# Patient Record
Sex: Female | Born: 1990 | Race: White | Hispanic: No | Marital: Single | Smoking: Current every day smoker
Health system: Southern US, Academic
[De-identification: ages and names within clinical notes are randomized; demographics above are authoritative.]

## PROBLEM LIST (undated history)

## (undated) DIAGNOSIS — I4581 Long QT syndrome: Secondary | ICD-10-CM

## (undated) DIAGNOSIS — D649 Anemia, unspecified: Secondary | ICD-10-CM

## (undated) DIAGNOSIS — Q270 Congenital absence and hypoplasia of umbilical artery: Secondary | ICD-10-CM

## (undated) DIAGNOSIS — F99 Mental disorder, not otherwise specified: Secondary | ICD-10-CM

## (undated) DIAGNOSIS — K635 Polyp of colon: Secondary | ICD-10-CM

## (undated) DIAGNOSIS — F845 Asperger's syndrome: Secondary | ICD-10-CM

## (undated) DIAGNOSIS — K219 Gastro-esophageal reflux disease without esophagitis: Secondary | ICD-10-CM

## (undated) DIAGNOSIS — E785 Hyperlipidemia, unspecified: Secondary | ICD-10-CM

## (undated) DIAGNOSIS — K029 Dental caries, unspecified: Secondary | ICD-10-CM

## (undated) DIAGNOSIS — E119 Type 2 diabetes mellitus without complications: Secondary | ICD-10-CM

## (undated) DIAGNOSIS — Z8719 Personal history of other diseases of the digestive system: Secondary | ICD-10-CM

## (undated) DIAGNOSIS — R519 Headache, unspecified: Secondary | ICD-10-CM

## (undated) DIAGNOSIS — R569 Unspecified convulsions: Secondary | ICD-10-CM

## (undated) DIAGNOSIS — J45909 Unspecified asthma, uncomplicated: Secondary | ICD-10-CM

## (undated) DIAGNOSIS — G8929 Other chronic pain: Secondary | ICD-10-CM

## (undated) DIAGNOSIS — F419 Anxiety disorder, unspecified: Secondary | ICD-10-CM

## (undated) DIAGNOSIS — G40909 Epilepsy, unspecified, not intractable, without status epilepticus: Secondary | ICD-10-CM

## (undated) DIAGNOSIS — E282 Polycystic ovarian syndrome: Secondary | ICD-10-CM

## (undated) DIAGNOSIS — I10 Essential (primary) hypertension: Secondary | ICD-10-CM

## (undated) DIAGNOSIS — Z8669 Personal history of other diseases of the nervous system and sense organs: Secondary | ICD-10-CM

## (undated) DIAGNOSIS — F32A Depression, unspecified: Secondary | ICD-10-CM

## (undated) DIAGNOSIS — T7840XA Allergy, unspecified, initial encounter: Secondary | ICD-10-CM

## (undated) DIAGNOSIS — I214 Non-ST elevation (NSTEMI) myocardial infarction: Secondary | ICD-10-CM

## (undated) DIAGNOSIS — F411 Generalized anxiety disorder: Secondary | ICD-10-CM

## (undated) HISTORY — PX: ADENOIDECTOMY AND MYRINGOTOMY WITH TUBE PLACEMENT: SHX5714

## (undated) HISTORY — PX: TONSILLECTOMY: SUR1361

## (undated) HISTORY — DX: Depression, unspecified: F32.A

## (undated) HISTORY — DX: Dental caries, unspecified: K02.9

## (undated) HISTORY — DX: Polyp of colon: K63.5

## (undated) HISTORY — DX: Other chronic pain: G89.29

## (undated) HISTORY — PX: OTHER SURGICAL HISTORY: SHX169

## (undated) HISTORY — DX: Polycystic ovarian syndrome: E28.2

## (undated) HISTORY — DX: Hyperlipidemia, unspecified: E78.5

## (undated) HISTORY — DX: Unspecified asthma, uncomplicated: J45.909

## (undated) HISTORY — DX: Allergy, unspecified, initial encounter: T78.40XA

## (undated) HISTORY — DX: Epilepsy, unspecified, not intractable, without status epilepticus: G40.909

## (undated) HISTORY — DX: Gastro-esophageal reflux disease without esophagitis: K21.9

## (undated) HISTORY — DX: Personal history of other diseases of the nervous system and sense organs: Z86.69

## (undated) HISTORY — PX: COLONOSCOPY: SHX174

## (undated) HISTORY — DX: Anemia, unspecified: D64.9

## (undated) HISTORY — DX: Congenital absence and hypoplasia of umbilical artery: Q27.0

## (undated) HISTORY — DX: Personal history of other diseases of the digestive system: Z87.19

## (undated) HISTORY — DX: Non-ST elevation (NSTEMI) myocardial infarction: I21.4

## (undated) HISTORY — DX: Type 2 diabetes mellitus without complications: E11.9

## (undated) HISTORY — DX: Generalized anxiety disorder: F41.1

## (undated) HISTORY — DX: Essential (primary) hypertension: I10

## (undated) HISTORY — PX: HX EAR TUBES: 2100001170

## (undated) HISTORY — PX: ESOPHAGOGASTRODUODENOSCOPY: SHX1529

---
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
## 2016-10-03 DIAGNOSIS — R809 Proteinuria, unspecified: Secondary | ICD-10-CM | POA: Insufficient documentation

## 2016-10-03 DIAGNOSIS — E1029 Type 1 diabetes mellitus with other diabetic kidney complication: Secondary | ICD-10-CM | POA: Insufficient documentation

## 2018-05-05 DIAGNOSIS — G8929 Other chronic pain: Secondary | ICD-10-CM | POA: Insufficient documentation

## 2018-05-05 DIAGNOSIS — E042 Nontoxic multinodular goiter: Secondary | ICD-10-CM | POA: Insufficient documentation

## 2018-05-05 DIAGNOSIS — K589 Irritable bowel syndrome without diarrhea: Secondary | ICD-10-CM | POA: Insufficient documentation

## 2018-05-05 DIAGNOSIS — R519 Headache, unspecified: Secondary | ICD-10-CM | POA: Insufficient documentation

## 2018-05-05 DIAGNOSIS — F819 Developmental disorder of scholastic skills, unspecified: Secondary | ICD-10-CM | POA: Insufficient documentation

## 2018-05-05 DIAGNOSIS — F445 Conversion disorder with seizures or convulsions: Secondary | ICD-10-CM | POA: Insufficient documentation

## 2018-05-05 DIAGNOSIS — E079 Disorder of thyroid, unspecified: Secondary | ICD-10-CM | POA: Insufficient documentation

## 2018-05-05 DIAGNOSIS — F419 Anxiety disorder, unspecified: Secondary | ICD-10-CM | POA: Insufficient documentation

## 2018-05-05 DIAGNOSIS — R918 Other nonspecific abnormal finding of lung field: Secondary | ICD-10-CM | POA: Insufficient documentation

## 2018-05-05 DIAGNOSIS — Z8719 Personal history of other diseases of the digestive system: Secondary | ICD-10-CM | POA: Insufficient documentation

## 2018-05-05 DIAGNOSIS — G4700 Insomnia, unspecified: Secondary | ICD-10-CM | POA: Insufficient documentation

## 2018-05-05 DIAGNOSIS — L680 Hirsutism: Secondary | ICD-10-CM | POA: Insufficient documentation

## 2019-06-30 DIAGNOSIS — Z862 Personal history of diseases of the blood and blood-forming organs and certain disorders involving the immune mechanism: Secondary | ICD-10-CM | POA: Insufficient documentation

## 2019-07-20 LAB — OB RESULTS CONSOLE HIV ANTIBODY (ROUTINE TESTING)
HIV: NONREACTIVE
HIV: NONREACTIVE

## 2019-07-20 LAB — OB RESULTS CONSOLE HGB/HCT, BLOOD
HCT: 39 (ref 29–41)
HCT: 39 (ref 29–41)
Hemoglobin: 13.4
Hemoglobin: 13.4

## 2019-07-20 LAB — OB RESULTS CONSOLE ABO/RH: "RH Type ": POSITIVE

## 2019-07-20 LAB — OB RESULTS CONSOLE PLATELET COUNT: Platelets: 266

## 2019-07-20 LAB — OB RESULTS CONSOLE RUBELLA ANTIBODY, IGM: Rubella: IMMUNE

## 2019-07-20 LAB — OB RESULTS CONSOLE TSH: TSH: 2.37

## 2019-07-20 LAB — OB RESULTS CONSOLE RPR: RPR: NONREACTIVE

## 2019-07-20 LAB — OB RESULTS CONSOLE ANTIBODY SCREEN: Antibody Screen: NEGATIVE

## 2019-07-20 LAB — OB RESULTS CONSOLE GC/CHLAMYDIA
Chlamydia: NEGATIVE
Gonorrhea: NEGATIVE

## 2019-07-20 LAB — SICKLE CELL SCREEN: Sickle Cell Screen: NORMAL

## 2019-07-20 LAB — OB RESULTS CONSOLE HEPATITIS B SURFACE ANTIGEN: Hepatitis B Surface Ag: NEGATIVE

## 2019-07-20 LAB — CYSTIC FIBROSIS DIAGNOSTIC STUDY: Interpretation-CFDNA:: NEGATIVE

## 2019-12-09 ENCOUNTER — Other Ambulatory Visit: Payer: Self-pay

## 2019-12-09 ENCOUNTER — Encounter: Payer: Self-pay | Admitting: Obstetrics and Gynecology

## 2019-12-09 ENCOUNTER — Observation Stay
Admission: EM | Admit: 2019-12-09 | Discharge: 2019-12-09 | Disposition: A | Discharge status: Home / Self Care | Payer: Medicaid Other | Attending: Obstetrics and Gynecology | Admitting: Obstetrics and Gynecology

## 2019-12-09 DIAGNOSIS — Z3A34 34 weeks gestation of pregnancy: Secondary | ICD-10-CM

## 2019-12-09 DIAGNOSIS — O24414 Gestational diabetes mellitus in pregnancy, insulin controlled: Secondary | ICD-10-CM | POA: Diagnosis not present

## 2019-12-09 DIAGNOSIS — O0933 Supervision of pregnancy with insufficient antenatal care, third trimester: Secondary | ICD-10-CM | POA: Diagnosis not present

## 2019-12-09 DIAGNOSIS — R109 Unspecified abdominal pain: Secondary | ICD-10-CM | POA: Diagnosis not present

## 2019-12-09 DIAGNOSIS — O163 Unspecified maternal hypertension, third trimester: Secondary | ICD-10-CM | POA: Diagnosis not present

## 2019-12-09 DIAGNOSIS — O26893 Other specified pregnancy related conditions, third trimester: Secondary | ICD-10-CM | POA: Diagnosis present

## 2019-12-09 DIAGNOSIS — O09293 Supervision of pregnancy with other poor reproductive or obstetric history, third trimester: Secondary | ICD-10-CM

## 2019-12-09 HISTORY — DX: Type 2 diabetes mellitus without complications: E11.9

## 2019-12-09 HISTORY — DX: Mental disorder, not otherwise specified: F99

## 2019-12-09 HISTORY — DX: Anxiety disorder, unspecified: F41.9

## 2019-12-09 HISTORY — DX: Headache, unspecified: R51.9

## 2019-12-09 HISTORY — DX: Unspecified convulsions: R56.9

## 2019-12-09 LAB — URINE DRUG SCREEN, QUALITATIVE (ARMC ONLY)
Amphetamines, Ur Screen: NOT DETECTED
Barbiturates, Ur Screen: NOT DETECTED
Benzodiazepine, Ur Scrn: NOT DETECTED
Cannabinoid 50 Ng, Ur ~~LOC~~: NOT DETECTED
Cocaine Metabolite,Ur ~~LOC~~: NOT DETECTED
MDMA (Ecstasy)Ur Screen: NOT DETECTED
Methadone Scn, Ur: NOT DETECTED
Opiate, Ur Screen: NOT DETECTED
Phencyclidine (PCP) Ur S: NOT DETECTED
Tricyclic, Ur Screen: NOT DETECTED

## 2019-12-09 LAB — GLUCOSE, CAPILLARY: Glucose-Capillary: 197 mg/dL — ABNORMAL HIGH (ref 70–99)

## 2019-12-09 MED ORDER — LACTATED RINGERS IV BOLUS
1000.0000 mL | Freq: Once | INTRAVENOUS | Status: AC
  Administered 2019-12-09: 20:00:00 1000 mL via INTRAVENOUS

## 2019-12-09 NOTE — Discharge Instructions (Signed)
Type 1 or Type 2 Diabetes Mellitus During Pregnancy, Diagnosis Type 1 diabetes (type 1 diabetes mellitus) and type 2 diabetes (type 2 diabetes mellitus) are long-term (chronic) diseases. Your diabetes may be caused by one or both of these problems:  Your pancreas does not make enough of a hormone called insulin.  Your pancreas does not respond in a normal way to insulin that it makes. Insulin lets sugars (glucose) go into cells in the body. This gives you energy. If you have diabetes, sugars cannot get into cells. This causes high blood sugar (hyperglycemia). If diabetes is treated, it may not hurt you or your baby. Your doctor will set treatment goals for you. In general, you should have these blood sugar levels:  After not eating for a long time (fasting): 95 mg/dL (5.3 mmol/L).  After meals (postprandial): ? One hour after a meal: at or below 140 mg/dL (7.8 mmol/L). ? Two hours after a meal: at or below 120 mg/dL (6.7 mmol/L).  A1c (hemoglobin A1c) level: 6-6.5%. Follow these instructions at home: Questions to ask your doctor  You may want to ask these questions: ? Do I need to meet with a diabetes educator? ? Where can I find a support group for people with diabetes? ? What equipment will I need to care for myself at home? ? What medicines do I need? When should I take them? ? How often do I need to check my blood sugar? ? What number can I call if I have questions? ? When is my next doctor's visit? General instructions  Take over-the-counter and prescription medicines only as told by your doctor.  Stay at a healthy weight during pregnancy.  Keep all follow-up visits as told by your doctor. This is important. Contact a doctor if:  Your blood sugar is at or above 240 mg/dL (16.1 mmol/L).  Your blood sugar is at or above 200 mg/dL (09.6 mmol/L), and you have ketones in your pee (urine).  You have been sick or have had a fever for 2 days or more and you are not getting  better.  You have any of these problems for more than 6 hours: ? You cannot eat or drink. ? You feel sick to your stomach (nauseous). ? You throw up (vomit). ? You have watery poop (diarrhea). Get help right away if:  Your blood sugar is lower than 54 mg/dL (3 mmol/L).  You get confused.  You have trouble: ? Thinking clearly. ? Breathing.  Your baby moves less than normal.  You have: ? Moderate or large ketone levels in your pee. ? Vaginal bleeding. ? Unusual fluid coming from your vagina. ? Early contractions. These may feel like tightness in your belly. Summary  Type 1 diabetes (type 1 diabetes mellitus) and type 2 diabetes (type 2 diabetes mellitus) are long-term (chronic) diseases.  If diabetes is treated, it may not hurt you or your baby.  Your doctor will set treatment goals for you. This information is not intended to replace advice given to you by your health care provider. Make sure you discuss any questions you have with your health care provider. Document Revised: 03/03/2019 Document Reviewed: 12/16/2015 Elsevier Patient Education  2020 Elsevier Inc. Abdominal Pain During Pregnancy  Belly (abdominal) pain is common during pregnancy. There are many possible causes. Most of the time, it is not a serious problem. Other times, it can be a sign that something is wrong with the pregnancy. Always tell your doctor if you have belly pain.  Follow these instructions at home:  Do not have sex or put anything in your vagina until your pain goes away completely.  Get plenty of rest until your pain gets better.  Drink enough fluid to keep your pee (urine) pale yellow.  Take over-the-counter and prescription medicines only as told by your doctor.  Keep all follow-up visits as told by your doctor. This is important. Contact a doctor if:  Your pain continues or gets worse after resting.  You have lower belly pain that: ? Comes and goes at regular times. ? Spreads to  your back. ? Feels like menstrual cramps.  You have pain or burning when you pee (urinate). Get help right away if:  You have a fever or chills.  You have vaginal bleeding.  You are leaking fluid from your vagina.  You are passing tissue from your vagina.  You throw up (vomit) for more than 24 hours.  You have watery poop (diarrhea) for more than 24 hours.  Your baby is moving less than usual.  You feel very weak or faint.  You have shortness of breath.  You have very bad pain in your upper belly. Summary  Belly (abdominal) pain is common during pregnancy. There are many possible causes.  If you have belly pain during pregnancy, tell your doctor right away.  Keep all follow-up visits as told by your doctor. This is important. This information is not intended to replace advice given to you by your health care provider. Make sure you discuss any questions you have with your health care provider. Document Revised: 03/02/2019 Document Reviewed: 02/14/2017 Elsevier Patient Education  [place].

## 2019-12-09 NOTE — Progress Notes (Signed)
Evans MD notified of patient arrival to unit and inital vital signs including CBG 197. Provider gave verbal orders and will be updated as to lab/diagnostic results as soon as possible.

## 2019-12-09 NOTE — Progress Notes (Addendum)
Evans MD notified of BP's, cervical exam, and contraction frequency. Pt d/c home to self care and educated about establishing care this week to obtain [facility]. Pt educated on signs and symptoms to come back to be assessed. Pt verbalized understanding.

## 2019-12-09 NOTE — OB Triage Note (Addendum)
Pt is a 28yo G2P0 at an estimated [redacted]w[redacted]d that presents from ED with c/o abdominal pain and pelvic pain.  Pt states that "my original OB said something different but I know my last menstrual period was 5/22."   Pt states "this was a twin pregnancy but I lost baby B at 9 weeks." Pt states " I have lost a lot of weight recently. I use the same scale." Pt states on 1/3 she weighed 211 lb and today weighed 209 lb. Pt states she has had 4 total OB visits this pregnancy and one Hospital visit in [place] for elevated BP. Pt states she has chronic hypertension and has not had had her medication in 4 days. Pt is an insulin dependent diabetic and CBG was 197. Pt denies VB and states some LOF since December but " the hospital I went to ran 2 different test and said they were negative for my water being broken." Pt states she can't get records from the hospital. Pt states positive FM. Initial BP 149/93 and cycling q15.

## 2019-12-10 ENCOUNTER — Encounter: Payer: Self-pay | Admitting: Obstetrics and Gynecology

## 2019-12-10 ENCOUNTER — Other Ambulatory Visit: Payer: Self-pay | Admitting: Obstetrics and Gynecology

## 2019-12-10 ENCOUNTER — Ambulatory Visit (INDEPENDENT_AMBULATORY_CARE_PROVIDER_SITE_OTHER): Payer: Medicaid Other | Admitting: Obstetrics and Gynecology

## 2019-12-10 VITALS — BP 150/80 | Wt 209.0 lb

## 2019-12-10 DIAGNOSIS — O99343 Other mental disorders complicating pregnancy, third trimester: Secondary | ICD-10-CM

## 2019-12-10 DIAGNOSIS — O219 Vomiting of pregnancy, unspecified: Secondary | ICD-10-CM | POA: Diagnosis not present

## 2019-12-10 DIAGNOSIS — F600 Paranoid personality disorder: Secondary | ICD-10-CM

## 2019-12-10 DIAGNOSIS — Z113 Encounter for screening for infections with a predominantly sexual mode of transmission: Secondary | ICD-10-CM

## 2019-12-10 DIAGNOSIS — O24113 Pre-existing diabetes mellitus, type 2, in pregnancy, third trimester: Secondary | ICD-10-CM

## 2019-12-10 DIAGNOSIS — E785 Hyperlipidemia, unspecified: Secondary | ICD-10-CM

## 2019-12-10 DIAGNOSIS — Z3A33 33 weeks gestation of pregnancy: Secondary | ICD-10-CM

## 2019-12-10 DIAGNOSIS — G40909 Epilepsy, unspecified, not intractable, without status epilepticus: Secondary | ICD-10-CM

## 2019-12-10 DIAGNOSIS — O99353 Diseases of the nervous system complicating pregnancy, third trimester: Secondary | ICD-10-CM

## 2019-12-10 DIAGNOSIS — O10013 Pre-existing essential hypertension complicating pregnancy, third trimester: Secondary | ICD-10-CM

## 2019-12-10 DIAGNOSIS — F845 Asperger's syndrome: Secondary | ICD-10-CM

## 2019-12-10 DIAGNOSIS — F419 Anxiety disorder, unspecified: Secondary | ICD-10-CM

## 2019-12-10 DIAGNOSIS — O24013 Pre-existing diabetes mellitus, type 1, in pregnancy, third trimester: Secondary | ICD-10-CM | POA: Insufficient documentation

## 2019-12-10 DIAGNOSIS — O0990 Supervision of high risk pregnancy, unspecified, unspecified trimester: Secondary | ICD-10-CM

## 2019-12-10 DIAGNOSIS — Z124 Encounter for screening for malignant neoplasm of cervix: Secondary | ICD-10-CM

## 2019-12-10 DIAGNOSIS — O0993 Supervision of high risk pregnancy, unspecified, third trimester: Secondary | ICD-10-CM

## 2019-12-10 DIAGNOSIS — O99350 Diseases of the nervous system complicating pregnancy, unspecified trimester: Secondary | ICD-10-CM

## 2019-12-10 MED ORDER — ACCU-CHEK AVIVA PLUS VI STRP: ORAL_STRIP | 12 refills | Status: DC

## 2019-12-10 MED ORDER — TRIMETHOBENZAMIDE HCL 300 MG PO CAPS: 300.0000 mg | ORAL_CAPSULE | Freq: Three times a day (TID) | ORAL | 11 refills | Status: DC

## 2019-12-10 MED ORDER — CLONIDINE HCL 0.3 MG PO TABS: 0.3000 mg | ORAL_TABLET | Freq: Every day | ORAL | 11 refills | Status: AC

## 2019-12-10 MED ORDER — PEN NEEDLES 31G X 6 MM MISC: 1.0000 | Freq: Four times a day (QID) | 11 refills | Status: DC

## 2019-12-10 MED ORDER — CLONAZEPAM 1 MG PO TABS: 1.0000 mg | ORAL_TABLET | Freq: Two times a day (BID) | ORAL | 5 refills | Status: DC

## 2019-12-10 NOTE — Progress Notes (Signed)
NOB C/o contractions, nausea with contractions, elevated BP Poss lof, no vb, FM less active last 2 days

## 2019-12-10 NOTE — Progress Notes (Signed)
12/14/2019   Chief Complaint: Missed period  Transfer of Care Patient: yes  History of Present Illness: Joy [name] is a 29 y.o. G2P0010 [redacted]w[redacted]d based on Patient's last menstrual period was 04/17/2019. with an Estimated Date of Delivery: 02/21/20, with the above CC.   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  She has moved several times and stayed with family and friends although she tends to be kicked out of various housing arrangements.  She was seen recently at the hospital by Dr. Logan Bores. She complains of pain last night and leakage of fluid but doe not feel like her concerns were adequately addressed. She is concerned about the possibility of preterm labor and PROM.    Her periods were: regular periods every 28 days She was using no method when she conceived.  She has Positive signs or symptoms of nausea/vomiting of pregnancy. She has Positive signs or symptoms of miscarriage or preterm labor She was not taking different medications around the time she conceived/early pregnancy. Since her LMP, she has not used alcohol Since her LMP, she has used tobacco products. She smokes 2-4 cigarettes a day. She has smoked for 10 years. Since her LMP, she has not used illegal drugs.    Current or past history of domestic violence. no  Infection History:  1. Since her LMP, she has not had a viral illness.  2. She admits to close contact with children on a regular basis     3. She denies a history of chicken pox. She reports vaccination for chicken pox in the past. 4. Patient or partner has history of genital herpes  no 5. History of STI (GC, CT, HPV, syphilis, HIV)  yes  history of cchlamydia 6.  She does not live with someone with TB or TB exposed. 7. History of recent travel :  no 8. She identifies Negative Zika risk factors for her and her partner 7. There are not cats in the home in the home.  She understands that while pregnant she should not change cat litter.   Genetic Screening Questions: (Includes patient, baby's father, or anyone in either family)   1. Patient's age >/= 26 at [facility]  no 2. Thalassemia ([place], [place], Mediterranean, or Asian background): MCV<80  no 3. Neural tube defect (meningomyelocele, spina bifida, anencephaly)  no 4. Congenital heart defect  yes  FOB has a "hole in his heart" 5. Down syndrome  Yes Third cousin 50. Tay-Sachs (Jewish, [place]  no 7. Canavan's Disease  no 8. Sickle cell disease or trait (African)  no  9. Hemophilia or other blood disorders  no  10. Muscular dystrophy  no  11. Cystic fibrosis  no  12. Huntington's Chorea  no  13. Mental retardation/autism  Yes- she has a history of asperger 14. Other inherited genetic or chromosomal disorder  no 15. Maternal metabolic disorder (DM, PKU, etc)  yes 16. Patient or FOB with a child with a birth defect not listed above no  16a. Patient or FOB with a birth defect themselves yes 17. Recurrent pregnancy loss, or stillbirth  no  18. Any medications since LMP other than prenatal vitamins (include vitamins, supplements, OTC meds, drugs, alcohol)  no 19. Any other genetic/environmental exposure to discuss  no  ROS:  Review of Systems  Constitutional: Negative for chills, fever, malaise/fatigue and weight loss.  HENT: Negative for congestion, hearing loss and sinus pain.   Eyes: Negative for blurred vision and double vision.  Respiratory: Negative for cough, sputum production, shortness of breath and wheezing.   Cardiovascular: Negative for chest pain, palpitations, orthopnea and leg swelling.  Gastrointestinal: Negative for abdominal pain, constipation,  diarrhea, nausea and vomiting.  Genitourinary: Negative for dysuria, flank pain, frequency, hematuria and urgency.  Musculoskeletal: Negative for back pain, falls and joint pain.  Skin: Negative for itching and rash.  Neurological: Negative for dizziness and headaches.  Psychiatric/Behavioral: Negative for depression, substance abuse and suicidal ideas. The patient is not nervous/anxious.     OBGYN History: As per HPI. OB History  Gravida Para Term Preterm AB Living  2       1    SAB TAB Ectopic Multiple Live Births  1            # Outcome Date GA Lbr Len/2nd Weight Sex Delivery Anes PTL Lv  2 Current           1 SAB 09/28/10            Any issues with any prior pregnancies: no Any prior children are healthy, doing well, without any problems or issues: not applicable Last pap smear   History of STIs: Yes   Past Medical History: Past Medical History:  Diagnosis Date  . Anxiety   . Diabetes mellitus without complication (HCC)   . Headache   . Mental disorder    Depression. past suicide attempt  . Seizures (HCC)     Past Surgical History: Past Surgical History:  Procedure Laterality Date  . TONSILLECTOMY      Family History:  History reviewed. No pertinent family history. She denies any female cancers, bleeding or blood clotting disorders.   Social History:  Social History   Socioeconomic History  . Marital status: Significant Other    Spouse name: michael  . Number of children: Not on file  . Years of education: Not on file  . Highest education level: Not on file  Occupational History  . Not on file  Tobacco Use  . Smoking status: Current Every Day Smoker  . Smokeless tobacco: Current User  Substance and Sexual Activity  . Alcohol use: Never  . Drug use: Not Currently  . Sexual activity: Yes    Birth control/protection: Condom  Other Topics Concern  . Not on file  Social History Narrative  . Not on file   Social Determinants of Health   Financial  Resource Strain:   . Difficulty of Paying Living Expenses: Not on file  Food Insecurity:   . Worried About Programme researcher, broadcasting/film/video in the Last Year: Not on file  . Ran Out of Food in the Last Year: Not on file  Transportation Needs:   . Lack of Transportation (Medical): Not on file  . Lack of Transportation (Non-Medical): Not on file  Physical Activity:   . Days of Exercise per Week: Not on file  . Minutes of Exercise per Session: Not on file  Stress:   . Feeling of Stress : Not on file  Social Connections:   . Frequency of Communication with Friends and Family: Not on file  . Frequency of Social Gatherings with Friends and Family: Not on file  . Attends Religious Services: Not on file  . Active Member of Clubs or Organizations: Not  on file  . Attends Archivist Meetings: Not on file  . Marital Status: Not on file  Intimate Partner Violence:   . Fear of Current or Ex-Partner: Not on file  . Emotionally Abused: Not on file  . Physically Abused: Not on file  . Sexually Abused: Not on file    Allergy: Allergies  Allergen Reactions  . Liraglutide     Drug induced acute pancreatitis.   Yvette Rack [Cyclobenzaprine]     Mood changes  . Gabapentin Other (See Comments)    Mood changes  . Latex Hives  . Manganese Trace Metal Additives [Manganese] Rash  . Other Rash    Current Outpatient Medications:  Current Outpatient Medications:  .  albuterol (VENTOLIN HFA) 108 (90 Base) MCG/ACT inhaler, Inhale 1-2 puffs into the lungs every 6 (six) hours as needed for wheezing or shortness of breath., Disp: , Rfl:  .  baclofen (LIORESAL) 10 MG tablet, Take 10 mg by mouth 3 (three) times daily., Disp: , Rfl:  .  Cholecalciferol (VITAMIN D3) 2400 UNIT/ML LIQD, by Does not apply route., Disp: , Rfl:  .  clonazePAM (KLONOPIN) 1 MG tablet, Take 1 tablet (1 mg total) by mouth 2 (two) times daily., Disp: 60 tablet, Rfl: 5 .  cloNIDine (CATAPRES) 0.3 MG tablet, Take 1 tablet (0.3 mg total) by  mouth daily., Disp: 30 tablet, Rfl: 11 .  insulin glargine (LANTUS) 100 UNIT/ML injection, Inject 32 Units into the skin daily., Disp: , Rfl:  .  insulin lispro (HUMALOG) 100 UNIT/ML injection, Inject 16 Units into the skin 3 (three) times daily before meals., Disp: , Rfl:  .  metFORMIN (GLUCOPHAGE) 1000 MG tablet, Take 1,000 mg by mouth 2 (two) times daily with a meal., Disp: , Rfl:  .  Prenatal Vit-Fe Fumarate-FA (PRENATAL MULTIVITAMIN) TABS tablet, Take 1 tablet by mouth daily at 12 noon., Disp: , Rfl:  .  trimethobenzamide (TIGAN) 300 MG capsule, Take 1 capsule (300 mg total) by mouth 3 (three) times daily., Disp: 90 capsule, Rfl: 11 .  glucose blood (ACCU-CHEK AVIVA PLUS) test strip, Use as instructed, Disp: 100 each, Rfl: 12 .  Insulin Pen Needle (PEN NEEDLES) 31G X 6 MM MISC, 1 each by Does not apply route 4 (four) times daily., Disp: 100 each, Rfl: 11   Physical Exam: Physical Exam  Constitutional: She is oriented to person, place, and time and well-developed, well-nourished, and in no distress.  HENT:  Head: Normocephalic and atraumatic.  Eyes: Pupils are equal, round, and reactive to light.  Neck: No thyromegaly present.  Cardiovascular: Normal rate and regular rhythm.  Pulmonary/Chest: Effort normal.  Abdominal: Soft. Bowel sounds are normal. She exhibits no distension. There is no abdominal tenderness. There is no rebound and no guarding.  Musculoskeletal:        General: Normal range of motion.     Cervical back: Normal range of motion and neck supple.  Neurological: She is alert and oriented to person, place, and time.  Skin: Skin is warm and dry.  Psychiatric: Affect and judgment normal.  Nursing note and vitals reviewed.    Assessment: Joy [name] is a 29 y.o. G2P0010 [redacted]w[redacted]d based on Patient's last menstrual period was 04/17/2019. with an Estimated Date of Delivery: 01/22/20,  for prenatal care.  Plan:  1) Avoid alcoholic beverages. 2) Patient encouraged not to  smoke.  3) Discontinue the use of all non-medicinal drugs and chemicals.  4) Take prenatal vitamins daily.  5) Seatbelt use advised 6)  Nutrition, food safety (fish, cheese advisories, and high nitrite foods) and exercise discussed. 7) Hospital and practice style delivering at [facility] discussed  8) Patient is asked about travel to areas at risk for the Zika virus, and counseled to avoid travel and exposure to mosquitoes or sexual partners who may have themselves been exposed to the virus. Testing is discussed, and will be ordered as appropriate.  9) Childbirth classes at [facility] advised 10) Genetic Screening, such as with 1st Trimester Screening, cell free fetal DNA, AFP testing, and Ultrasound, as well as with amniocentesis and CVS as appropriate, is discussed with patient. She plans to not have genetic testing this pregnancy. (late presentation)  Insulin dependant diabetes: did not bring glucose log- will need to initiate NSTs/AFI. Discussed importance of bringing glucose log to visits.  Taking Lantus 32 units at night, Humalog 16/16/16 and a sliding scale. She reports her blood sugars have ranged from 90-150 mostly. Growth [place] next visit MFM referral Refills of medications sent.  Elevated BP< declines reporting to L&D for preeclampsia evaluation. Reports elevated BP from not taking BP medication. Denies HA or vision changes.  No evidence or PROM on speculum exam/ slide. Normal amniotic fluid level on bedside US with 3cmx3cm pocket.   Due date changed from what she reported based on 9 wk [place] found in her prenatal records.   Problem list reviewed and updated.  I discussed the assessment and treatment plan with the patient. The patient was provided an opportunity to ask questions and all were answered. The patient agreed with the plan and demonstrated an understanding of the instructions.  Adelene Idler MD [facility], [facility] 12/14/2019 7:06 AM

## 2019-12-10 NOTE — Patient Instructions (Signed)
Third Trimester of Pregnancy The third trimester is from week 28 through week 40 (months 7 through 9). The third trimester is a time when the unborn baby (fetus) is growing rapidly. At the end of the ninth month, the fetus is about 20 inches in length and weighs 6-10 pounds. Body changes during your third trimester Your body will continue to go through many changes during pregnancy. The changes vary from woman to woman. During the third trimester:  Your weight will continue to increase. You can expect to gain 25-35 pounds (11-16 kg) by the end of the pregnancy.  You may begin to get stretch marks on your hips, abdomen, and breasts.  You may urinate more often because the fetus is moving lower into your pelvis and pressing on your bladder.  You may develop or continue to have heartburn. This is caused by increased hormones that slow down muscles in the digestive tract.  You may develop or continue to have constipation because increased hormones slow digestion and cause the muscles that push waste through your intestines to relax.  You may develop hemorrhoids. These are swollen veins (varicose veins) in the rectum that can itch or be painful.  You may develop swollen, bulging veins (varicose veins) in your legs.  You may have increased body aches in the pelvis, back, or thighs. This is due to weight gain and increased hormones that are relaxing your joints.  You may have changes in your hair. These can include thickening of your hair, rapid growth, and changes in texture. Some women also have hair loss during or after pregnancy, or hair that feels dry or thin. Your hair will most likely return to normal after your baby is born.  Your breasts will continue to grow and they will continue to become tender. A yellow fluid (colostrum) may leak from your breasts. This is the first milk you are producing for your baby.  Your belly button may stick out.  You may notice more swelling in your hands,  face, or ankles.  You may have increased tingling or numbness in your hands, arms, and legs. The skin on your belly may also feel numb.  You may feel short of breath because of your expanding uterus.  You may have more problems sleeping. This can be caused by the size of your belly, increased need to urinate, and an increase in your body's metabolism.  You may notice the fetus "dropping," or moving lower in your abdomen (lightening).  You may have increased vaginal discharge.  You may notice your joints feel loose and you may have pain around your pelvic bone. What to expect at prenatal visits You will have prenatal exams every 2 weeks until week 36. Then you will have weekly prenatal exams. During a routine prenatal visit:  You will be weighed to make sure you and the baby are growing normally.  Your blood pressure will be taken.  Your abdomen will be measured to track your baby's growth.  The fetal heartbeat will be listened to.  Any test results from the previous visit will be discussed.  You may have a cervical check near your due date to see if your cervix has softened or thinned (effaced).  You will be tested for Group B streptococcus. This happens between 35 and 37 weeks. Your health care provider may ask you:  What your birth plan is.  How you are feeling.  If you are feeling the baby move.  If you have had any abnormal   symptoms, such as leaking fluid, bleeding, severe headaches, or abdominal cramping.  If you are using any tobacco products, including cigarettes, chewing tobacco, and electronic cigarettes.  If you have any questions. Other tests or screenings that may be performed during your third trimester include:  Blood tests that check for low iron levels (anemia).  Fetal testing to check the health, activity level, and growth of the fetus. Testing is done if you have certain medical conditions or if there are problems during the pregnancy.  Nonstress test  (NST). This test checks the health of your baby to make sure there are no signs of problems, such as the baby not getting enough oxygen. During this test, a belt is placed around your belly. The baby is made to move, and its heart rate is monitored during movement. What is false labor? False labor is a condition in which you feel small, irregular tightenings of the muscles in the womb (contractions) that usually go away with rest, changing position, or drinking water. These are called Braxton Hicks contractions. Contractions may last for hours, days, or even weeks before true labor sets in. If contractions come at regular intervals, become more frequent, increase in intensity, or become painful, you should see your health care provider. What are the signs of labor?  Abdominal cramps.  Regular contractions that start at 10 minutes apart and become stronger and more frequent with time.  Contractions that start on the top of the uterus and spread down to the lower abdomen and back.  Increased pelvic pressure and dull back pain.  A watery or bloody mucus discharge that comes from the vagina.  Leaking of amniotic fluid. This is also known as your "water breaking." It could be a slow trickle or a gush. Let your health care provider know if it has a color or strange odor. If you have any of these signs, call your health care provider right away, even if it is before your due date. Follow these instructions at home: Medicines  Follow your health care provider's instructions regarding medicine use. Specific medicines may be either safe or unsafe to take during pregnancy.  Take a prenatal vitamin that contains at least 600 micrograms (mcg) of folic acid.  If you develop constipation, try taking a stool softener if your health care provider approves. Eating and drinking   Eat a balanced diet that includes fresh fruits and vegetables, whole grains, good sources of protein such as meat, eggs, or tofu,  and low-fat dairy. Your health care provider will help you determine the amount of weight gain that is right for you.  Avoid raw meat and uncooked cheese. These carry germs that can cause birth defects in the baby.  If you have low calcium intake from food, talk to your health care provider about whether you should take a daily calcium supplement.  Eat four or five small meals rather than three large meals a day.  Limit foods that are high in fat and processed sugars, such as fried and sweet foods.  To prevent constipation: ? Drink enough fluid to keep your urine clear or pale yellow. ? Eat foods that are high in fiber, such as fresh fruits and vegetables, whole grains, and beans. Activity  Exercise only as directed by your health care provider. Most women can continue their usual exercise routine during pregnancy. Try to exercise for 30 minutes at least 5 days a week. Stop exercising if you experience uterine contractions.  Avoid heavy lifting.  Do   not exercise in extreme heat or humidity, or at high altitudes.  Wear low-heel, comfortable shoes.  Practice good posture.  You may continue to have sex unless your health care provider tells you otherwise. Relieving pain and discomfort  Take frequent breaks and rest with your legs elevated if you have leg cramps or low back pain.  Take warm sitz baths to soothe any pain or discomfort caused by hemorrhoids. Use hemorrhoid cream if your health care provider approves.  Wear a good support bra to prevent discomfort from breast tenderness.  If you develop varicose veins: ? Wear support pantyhose or compression stockings as told by your healthcare provider. ? Elevate your feet for 15 minutes, 3-4 times a day. Prenatal care  Write down your questions. Take them to your prenatal visits.  Keep all your prenatal visits as told by your health care provider. This is important. Safety  Wear your seat belt at all times when driving.  Make  a list of emergency phone numbers, including numbers for family, friends, the hospital, and police and fire departments. General instructions  Avoid cat litter boxes and soil used by cats. These carry germs that can cause birth defects in the baby. If you have a cat, ask someone to clean the litter box for you.  Do not travel far distances unless it is absolutely necessary and only with the approval of your health care provider.  Do not use hot tubs, steam rooms, or saunas.  Do not drink alcohol.  Do not use any products that contain nicotine or tobacco, such as cigarettes and e-cigarettes. If you need help quitting, ask your health care provider.  Do not use any medicinal herbs or unprescribed drugs. These chemicals affect the formation and growth of the baby.  Do not douche or use tampons or scented sanitary pads.  Do not cross your legs for long periods of time.  To prepare for the arrival of your baby: ? Take prenatal classes to understand, practice, and ask questions about labor and delivery. ? Make a trial run to the hospital. ? Visit the hospital and tour the maternity area. ? Arrange for maternity or paternity leave through employers. ? Arrange for family and friends to take care of pets while you are in the hospital. ? Purchase a rear-facing car seat and make sure you know how to install it in your car. ? Pack your hospital bag. ? Prepare the baby's nursery. Make sure to remove all pillows and stuffed animals from the baby's crib to prevent suffocation.  Visit your dentist if you have not gone during your pregnancy. Use a soft toothbrush to brush your teeth and be gentle when you floss. Contact a health care provider if:  You are unsure if you are in labor or if your water has broken.  You become dizzy.  You have mild pelvic cramps, pelvic pressure, or nagging pain in your abdominal area.  You have lower back pain.  You have persistent nausea, vomiting, or  diarrhea.  You have an unusual or bad smelling vaginal discharge.  You have pain when you urinate. Get help right away if:  Your water breaks before 37 weeks.  You have regular contractions less than 5 minutes apart before 37 weeks.  You have a fever.  You are leaking fluid from your vagina.  You have spotting or bleeding from your vagina.  You have severe abdominal pain or cramping.  You have rapid weight loss or weight gain.  You have   shortness of breath with chest pain.  You notice sudden or extreme swelling of your face, hands, ankles, feet, or legs.  Your baby makes fewer than 10 movements in 2 hours.  You have severe headaches that do not go away when you take medicine.  You have vision changes. Summary  The third trimester is from week 28 through week 40, months 7 through 9. The third trimester is a time when the unborn baby (fetus) is growing rapidly.  During the third trimester, your discomfort may increase as you and your baby continue to gain weight. You may have abdominal, leg, and back pain, sleeping problems, and an increased need to urinate.  During the third trimester your breasts will keep growing and they will continue to become tender. A yellow fluid (colostrum) may leak from your breasts. This is the first milk you are producing for your baby.  False labor is a condition in which you feel small, irregular tightenings of the muscles in the womb (contractions) that eventually go away. These are called Braxton Hicks contractions. Contractions may last for hours, days, or even weeks before true labor sets in.  Signs of labor can include: abdominal cramps; regular contractions that start at 10 minutes apart and become stronger and more frequent with time; watery or bloody mucus discharge that comes from the vagina; increased pelvic pressure and dull back pain; and leaking of amniotic fluid. This information is not intended to replace advice given to you by your  health care provider. Make sure you discuss any questions you have with your health care provider. Document Revised: 03/05/2019 Document Reviewed: 12/18/2016 Elsevier Patient Education  2020 [employer]  

## 2019-12-11 ENCOUNTER — Other Ambulatory Visit (HOSPITAL_COMMUNITY)
Admission: RE | Admit: 2019-12-11 | Discharge: 2019-12-11 | Disposition: A | Discharge status: Home / Self Care | Payer: Medicaid Other | Source: Ambulatory Visit | Attending: Obstetrics and Gynecology | Admitting: Obstetrics and Gynecology

## 2019-12-11 DIAGNOSIS — Z113 Encounter for screening for infections with a predominantly sexual mode of transmission: Secondary | ICD-10-CM | POA: Insufficient documentation

## 2019-12-11 DIAGNOSIS — Z124 Encounter for screening for malignant neoplasm of cervix: Secondary | ICD-10-CM | POA: Insufficient documentation

## 2019-12-11 DIAGNOSIS — O0990 Supervision of high risk pregnancy, unspecified, unspecified trimester: Secondary | ICD-10-CM | POA: Insufficient documentation

## 2019-12-11 LAB — COMPREHENSIVE METABOLIC PANEL
ALT: 13 IU/L (ref 0–32)
AST: 20 IU/L (ref 0–40)
Albumin/Globulin Ratio: 1.3 (ref 1.2–2.2)
Albumin: 3.7 g/dL — ABNORMAL LOW (ref 3.9–5.0)
Alkaline Phosphatase: 124 IU/L — ABNORMAL HIGH (ref 39–117)
BUN/Creatinine Ratio: 8 — ABNORMAL LOW (ref 9–23)
BUN: 4 mg/dL — ABNORMAL LOW (ref 6–20)
Bilirubin Total: <0.2 mg/dL (ref 0.0–1.2)
CO2: 19 mmol/L — ABNORMAL LOW (ref 20–29)
Calcium: 10.1 mg/dL (ref 8.7–10.2)
Chloride: 101 mmol/L (ref 96–106)
Creatinine, Ser: 0.5 mg/dL — ABNORMAL LOW (ref 0.57–1.00)
GFR calc Af Amer: 153 mL/min/{1.73_m2} (ref >59)
GFR calc non Af Amer: 133 mL/min/{1.73_m2} (ref >59)
Globulin, Total: 2.8 g/dL (ref 1.5–4.5)
Glucose: 102 mg/dL — ABNORMAL HIGH (ref 65–99)
Potassium: 4.4 mmol/L (ref 3.5–5.2)
Sodium: 134 mmol/L (ref 134–144)
Total Protein: 6.5 g/dL (ref 6.0–8.5)

## 2019-12-11 LAB — PROTEIN / CREATININE RATIO, URINE
Creatinine, Urine: 29.5 mg/dL
Protein, Ur: 4.6 mg/dL
Protein/Creat Ratio: 156 mg/g creat (ref 0–200)

## 2019-12-11 LAB — CBC
Hematocrit: 37.9 % (ref 34.0–46.6)
Hemoglobin: 13 g/dL (ref 11.1–15.9)
MCH: 27.7 pg (ref 26.6–33.0)
MCHC: 34.3 g/dL (ref 31.5–35.7)
MCV: 81 fL (ref 79–97)
Platelets: 357 10*3/uL (ref 150–450)
RBC: 4.69 x10E6/uL (ref 3.77–5.28)
RDW: 14.5 % (ref 11.7–15.4)
WBC: 14.7 10*3/uL — ABNORMAL HIGH (ref 3.4–10.8)

## 2019-12-11 LAB — HEMOGLOBIN A1C
Est. average glucose Bld gHb Est-mCnc: 151 mg/dL
Hgb A1c MFr Bld: 6.9 % — ABNORMAL HIGH (ref 4.8–5.6)

## 2019-12-11 NOTE — Discharge Summary (Signed)
    L&D OB Triage Note  SUBJECTIVE Joy [name] is a 29 y.o. G2P0010 female at [redacted]w[redacted]d, EDD Estimated Date of Delivery: 01/22/20 who presented to triage with complaints of abdominal pain.  She has had limited prenatal care with this pregnancy.  She reports some type of discrepancy with her dating and is not at all sure that she has an EDC of 2/26.  She describes a history of chronic hypertension and diabetes previously treated with insulin.  Patient not being currently followed for hypertension or gestational insulin-dependent diabetes.  She states that this was originally a twin pregnancy but lost one of the twins early in the first trimester. She denies bleeding or leakage of fluid.  OB History  Gravida Para Term Preterm AB Living  2 0 0 0 1 0  SAB TAB Ectopic Multiple Live Births  1 0 0 0 0    # Outcome Date GA Lbr Len/2nd Weight Sex Delivery Anes PTL Lv  2 Current           1 SAB 09/28/10            No medications prior to admission.     OBJECTIVE  Nursing Evaluation:   BP (!) 149/79   Pulse (!) 101   Temp 98.3 F (36.8 C) (Oral)   Resp 16   Ht 5\' 5"  (1.651 m)   Wt 94.8 kg   LMP 04/17/2019   BMI 34.78 kg/m    Findings:   Elevated blood glucose, no cervical change-patient not in labor.  Irregular contractions which spaced further with IV hydration.  NST was performed and has been reviewed by me.  NST INTERPRETATION: Category I  Mode: External Baseline Rate (A): 145 bpm Variability: Moderate Accelerations: 15 x 15 Decelerations: None     Contraction Frequency (min): 3-8  ASSESSMENT Impression:  1.  Pregnancy:  G2P0010 at [redacted]w[redacted]d , EDD Estimated Date of Delivery: 01/22/20 2.  NST:  Category I  PLAN 1. Reassurance given 2. Discharge home with standard labor precautions given to return to L&D or call the office for problems. 3.  Strongly recommend follow-up and establishment of obstetric care this week.  Patient will need immediate evaluation for gestational  diabetes, chronic hypertension and gestational age dating.

## 2019-12-14 ENCOUNTER — Ambulatory Visit (INDEPENDENT_AMBULATORY_CARE_PROVIDER_SITE_OTHER): Payer: Medicaid Other

## 2019-12-14 ENCOUNTER — Ambulatory Visit (INDEPENDENT_AMBULATORY_CARE_PROVIDER_SITE_OTHER): Payer: Medicaid Other | Admitting: Obstetrics and Gynecology

## 2019-12-14 ENCOUNTER — Other Ambulatory Visit: Payer: Self-pay

## 2019-12-14 VITALS — BP 128/84 | Wt 211.0 lb

## 2019-12-14 DIAGNOSIS — F419 Anxiety disorder, unspecified: Secondary | ICD-10-CM | POA: Insufficient documentation

## 2019-12-14 DIAGNOSIS — F600 Paranoid personality disorder: Secondary | ICD-10-CM | POA: Insufficient documentation

## 2019-12-14 DIAGNOSIS — G40909 Epilepsy, unspecified, not intractable, without status epilepticus: Secondary | ICD-10-CM

## 2019-12-14 DIAGNOSIS — Z363 Encounter for antenatal screening for malformations: Secondary | ICD-10-CM

## 2019-12-14 DIAGNOSIS — E1069 Type 1 diabetes mellitus with other specified complication: Secondary | ICD-10-CM | POA: Insufficient documentation

## 2019-12-14 DIAGNOSIS — O99353 Diseases of the nervous system complicating pregnancy, third trimester: Secondary | ICD-10-CM

## 2019-12-14 DIAGNOSIS — O99350 Diseases of the nervous system complicating pregnancy, unspecified trimester: Secondary | ICD-10-CM | POA: Insufficient documentation

## 2019-12-14 DIAGNOSIS — O10013 Pre-existing essential hypertension complicating pregnancy, third trimester: Secondary | ICD-10-CM

## 2019-12-14 DIAGNOSIS — O0993 Supervision of high risk pregnancy, unspecified, third trimester: Secondary | ICD-10-CM

## 2019-12-14 DIAGNOSIS — Z3A30 30 weeks gestation of pregnancy: Secondary | ICD-10-CM

## 2019-12-14 DIAGNOSIS — F845 Asperger's syndrome: Secondary | ICD-10-CM | POA: Insufficient documentation

## 2019-12-14 DIAGNOSIS — O24113 Pre-existing diabetes mellitus, type 2, in pregnancy, third trimester: Secondary | ICD-10-CM

## 2019-12-14 DIAGNOSIS — E785 Hyperlipidemia, unspecified: Secondary | ICD-10-CM | POA: Insufficient documentation

## 2019-12-14 DIAGNOSIS — O0990 Supervision of high risk pregnancy, unspecified, unspecified trimester: Secondary | ICD-10-CM

## 2019-12-14 LAB — POCT URINALYSIS DIPSTICK OB
Glucose, UA: NEGATIVE
POC,PROTEIN,UA: NEGATIVE

## 2019-12-14 NOTE — Progress Notes (Signed)
Routine Prenatal Care Visit  Subjective  Joy [name] is a 29 y.o. G2P0010 at [redacted]w[redacted]d being seen today for ongoing prenatal care.  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  ----------------------------------------------------------------------------------- Patient reports no complaints.   Contractions: Irregular. Vag. Bleeding: None.  Movement: Present. Denies leaking of fluid.  ----------------------------------------------------------------------------------- The following portions of the patient's history were reviewed and updated as appropriate: allergies, current medications, past family history, past medical history, past social history, past surgical history and problem list. Problem list updated.   Objective  Blood pressure 128/84, weight 211 lb (95.7 kg), last menstrual period 04/17/2019. Pregravid weight Pregravid weight not on file Total Weight Gain Not found. Urinalysis:      Fetal Status: Fetal Heart Rate (bpm): 140   Movement: Present  Presentation: Vertex  General:  Alert, oriented and cooperative. Patient is in no acute distress.  Skin: Skin is warm and dry. No rash noted.   Cardiovascular: Normal heart rate noted  Respiratory: Normal respiratory effort, no problems with respiration noted  Abdomen: Soft, gravid, appropriate for gestational age. Pain/Pressure: Present     Pelvic:  Cervical exam deferred        Extremities: Normal range of motion.       ental Status: Normal mood and affect. Normal behavior. Normal judgment and thought content.   US OB Comp + 14 Wk  Result Date: 12/14/2019 Patient Name: Joy [name] DOB: June 13, 1991 MRN: [id number] ULTRASOUND REPORT Location: [facility] Date of Service: 12/14/2019 Indications:growth/afi Findings: [name] intrauterine pregnancy is visualized with FHR at 171 BPM. Biometrics give an (U/S) Gestational age of [redacted]w[redacted]d and an (U/S) EDD of 02/03/2020; this correlates with the clinically established Estimated Date of Delivery: 02/21/20. Fetal presentation is Cephalic. Placenta: posterior fundal Grade: 2 AFI: 16.6 cm Growth percentile is greater than 89.1%.  AC percentile is greater than 97.7%. EFW: 2149 g  ( 4 lb 12 oz ) Impression: 1. [redacted]w[redacted]d Viable Singleton Intrauterine pregnancy previously established criteria. 2. Growth is 89.1 %ile.  AFI is 16.6 cm. Recommendations: 1.Clinical correlation with the patient's History and Physical Exam. [name], RT There is a singleton gestation with normal amniotic fluid volume. The fetal biometry correlates with established dating.  Limited fetal anatomy was performed.The visualized fetal anatomical survey appears within normal limits within the resolution of ultrasound as described above.  It must be noted that a normal ultrasound is unable to rule out fetal aneuploidy.  [name], MD, [facility], [facility] 12/14/2019, 2:22 PM   Baseline: 140 Variability: moderate Accelerations: present Decelerations: absent Tocometry: N/A The patient was monitored for 30 minutes, fetal heart rate tracing was deemed reactive, category I tracing,  Assessment   29 y.o. G2P0010 at [redacted]w[redacted]d by  02/21/2020, by Ultrasound presenting for routine prenatal visit  Plan   pregnancy Problems (from 12/09/19 to present)    Problem Noted Resolved   Pre-existing essential hypertension during pregnancy in third trimester 12/14/2019 by [name],  MD No   Overview Signed 12/14/2019  7:17 AM by [name], MD    Taking clonidine 0.3mg  daily  Anxiety in pregnancy in third trimester, antepartum 12/14/2019 by [name], MD No   Seizure disorder during pregnancy, antepartum (HCC) 12/14/2019 by [name], MD No   Overview Signed 12/14/2019  7:17 AM by [name], MD    Hx of Seizures and Pseudoseizures      Supervision of high risk pregnancy, antepartum 12/11/2019 by [name], CMA No   Overview Addendum 12/14/2019  7:12 AM by [name], MD     Nursing Staff Provider  Office Location  [facility] Dating   9 wk [place]  Language  English Anatomy US   complete (outside institution)  Flu Vaccine   Genetic Screen  NIPS: normal XX   TDaP vaccine    Hgb A1C or  GTT Early : Third trimester :   Rhogam   not needed   LAB RESULTS   Feeding Plan  Blood Type B/Positive/-- (08/24 0000)   Contraception  Antibody Negative (08/24 0000)  Circumcision  Rubella Immune (08/24 0000)  Pediatrician   RPR Nonreactive (08/24 0000)   Support Person  Boyfriend HBsAg Negative (08/24 0000)   Prenatal Classes  HIV Non-reactive, Non-reactive (08/24 0000)    Varicella   BTL Consent  GBS  (For PCN allergy, check sensitivities)        VBAC Consent  no needed Pap  collected 2021    Hgb Electro   not needed    CF  negative     SMA  negative        Normal Inheritest Normal Materniti 21 XX      Type 2 diabetes mellitus affecting pregnancy in third trimester, antepartum 12/10/2019 by [name], MD No   Overview Signed 12/14/2019  7:15 AM by [name], MD    Lantus 32 units Humalog 16/16/16 Sliding scale          Gestational age appropriate obstetric precautions including but not limited to vaginal bleeding, contractions, leaking of fluid and fetal movement were reviewed in detail with the patient.    1) HTN - normotensive today, baseline labs obtained last visit  2) DM II  - continue current insulin regimen, given BG log to review - AC acceleration noted on growth today, normal AFI - twice weekly NST, weekly AFI. Once monthly growth - [facility] perinatal appointment scheduled 12/20/2018 - check records on dating fetal demise CRL used in a vanishing twin pregnancy, twin B which was viable had a CRL of [redacted]w[redacted]d on 07/20/2019 which also makes growth parameters much more in line with gestational age  Return in 3 days (on 12/17/2019) for ROB/NST.  Vena Austria, MD, [facility], [facility] 12/14/2019, 2:26 PM

## 2019-12-14 NOTE — Progress Notes (Signed)
ROB Ultrasound AFI/NST 

## 2019-12-17 ENCOUNTER — Ambulatory Visit (INDEPENDENT_AMBULATORY_CARE_PROVIDER_SITE_OTHER): Payer: Medicaid Other | Admitting: Obstetrics and Gynecology

## 2019-12-17 ENCOUNTER — Other Ambulatory Visit: Payer: Self-pay

## 2019-12-17 ENCOUNTER — Inpatient Hospital Stay
Admission: EM | Admit: 2019-12-17 | Discharge: 2019-12-19 | DRG: 832 | Discharge status: Against Medical Advice | Payer: Medicaid Other | Attending: Obstetrics & Gynecology | Admitting: Obstetrics & Gynecology

## 2019-12-17 ENCOUNTER — Encounter: Payer: Self-pay | Admitting: Obstetrics and Gynecology

## 2019-12-17 ENCOUNTER — Encounter: Payer: Self-pay | Admitting: Obstetrics & Gynecology

## 2019-12-17 VITALS — BP 130/80 | Wt 217.0 lb

## 2019-12-17 DIAGNOSIS — O24113 Pre-existing diabetes mellitus, type 2, in pregnancy, third trimester: Secondary | ICD-10-CM

## 2019-12-17 DIAGNOSIS — O26893 Other specified pregnancy related conditions, third trimester: Secondary | ICD-10-CM | POA: Diagnosis present

## 2019-12-17 DIAGNOSIS — Z3A33 33 weeks gestation of pregnancy: Secondary | ICD-10-CM

## 2019-12-17 DIAGNOSIS — O24013 Pre-existing diabetes mellitus, type 1, in pregnancy, third trimester: Secondary | ICD-10-CM

## 2019-12-17 DIAGNOSIS — Z794 Long term (current) use of insulin: Secondary | ICD-10-CM

## 2019-12-17 DIAGNOSIS — F419 Anxiety disorder, unspecified: Secondary | ICD-10-CM

## 2019-12-17 DIAGNOSIS — O99353 Diseases of the nervous system complicating pregnancy, third trimester: Secondary | ICD-10-CM | POA: Diagnosis present

## 2019-12-17 DIAGNOSIS — Z5329 Procedure and treatment not carried out because of patient's decision for other reasons: Secondary | ICD-10-CM | POA: Diagnosis present

## 2019-12-17 DIAGNOSIS — F845 Asperger's syndrome: Secondary | ICD-10-CM | POA: Diagnosis present

## 2019-12-17 DIAGNOSIS — O99350 Diseases of the nervous system complicating pregnancy, unspecified trimester: Secondary | ICD-10-CM

## 2019-12-17 DIAGNOSIS — K029 Dental caries, unspecified: Secondary | ICD-10-CM | POA: Diagnosis present

## 2019-12-17 DIAGNOSIS — O99343 Other mental disorders complicating pregnancy, third trimester: Secondary | ICD-10-CM | POA: Diagnosis present

## 2019-12-17 DIAGNOSIS — E1165 Type 2 diabetes mellitus with hyperglycemia: Secondary | ICD-10-CM | POA: Diagnosis present

## 2019-12-17 DIAGNOSIS — O0990 Supervision of high risk pregnancy, unspecified, unspecified trimester: Secondary | ICD-10-CM

## 2019-12-17 DIAGNOSIS — G40909 Epilepsy, unspecified, not intractable, without status epilepticus: Secondary | ICD-10-CM | POA: Diagnosis present

## 2019-12-17 DIAGNOSIS — O0993 Supervision of high risk pregnancy, unspecified, third trimester: Secondary | ICD-10-CM

## 2019-12-17 DIAGNOSIS — Z20822 Contact with and (suspected) exposure to covid-19: Secondary | ICD-10-CM | POA: Diagnosis present

## 2019-12-17 DIAGNOSIS — O10013 Pre-existing essential hypertension complicating pregnancy, third trimester: Secondary | ICD-10-CM

## 2019-12-17 DIAGNOSIS — F600 Paranoid personality disorder: Secondary | ICD-10-CM | POA: Diagnosis present

## 2019-12-17 DIAGNOSIS — K0510 Chronic gingivitis, plaque induced: Secondary | ICD-10-CM | POA: Diagnosis present

## 2019-12-17 DIAGNOSIS — O99810 Abnormal glucose complicating pregnancy: Secondary | ICD-10-CM | POA: Diagnosis present

## 2019-12-17 LAB — CYTOLOGY - PAP
Chlamydia: NEGATIVE
Comment: NEGATIVE
Comment: NEGATIVE
Comment: NORMAL
Diagnosis: NEGATIVE
Neisseria Gonorrhea: NEGATIVE
Trichomonas: NEGATIVE

## 2019-12-17 LAB — COMPREHENSIVE METABOLIC PANEL
ALT: 14 U/L (ref 0–44)
AST: 15 U/L (ref 15–41)
Albumin: 2.8 g/dL — ABNORMAL LOW (ref 3.5–5.0)
Alkaline Phosphatase: 95 U/L (ref 38–126)
Anion gap: 9 (ref 5–15)
BUN: 6 mg/dL (ref 6–20)
CO2: 23 mmol/L (ref 22–32)
Calcium: 9.4 mg/dL (ref 8.9–10.3)
Chloride: 106 mmol/L (ref 98–111)
Creatinine, Ser: 0.45 mg/dL (ref 0.44–1.00)
GFR calc Af Amer: >60 mL/min (ref >60)
GFR calc non Af Amer: >60 mL/min (ref >60)
Glucose, Bld: 103 mg/dL — ABNORMAL HIGH (ref 70–99)
Potassium: 3.8 mmol/L (ref 3.5–5.1)
Sodium: 138 mmol/L (ref 135–145)
Total Bilirubin: 0.4 mg/dL (ref 0.3–1.2)
Total Protein: 6.6 g/dL (ref 6.5–8.1)

## 2019-12-17 LAB — CBC
HCT: 37 % (ref 36.0–46.0)
Hemoglobin: 12.2 g/dL (ref 12.0–15.0)
MCH: 27.2 pg (ref 26.0–34.0)
MCHC: 33 g/dL (ref 30.0–36.0)
MCV: 82.6 fL (ref 80.0–100.0)
Platelets: 341 10*3/uL (ref 150–400)
RBC: 4.48 MIL/uL (ref 3.87–5.11)
RDW: 14.6 % (ref 11.5–15.5)
WBC: 12.5 10*3/uL — ABNORMAL HIGH (ref 4.0–10.5)
nRBC: 0 % (ref 0.0–0.2)

## 2019-12-17 LAB — ABO/RH: ABO/RH(D): B POS

## 2019-12-17 LAB — GLUCOSE, CAPILLARY
Glucose-Capillary: 94 mg/dL (ref 70–99)
Glucose-Capillary: 96 mg/dL (ref 70–99)
Glucose-Capillary: 84 mg/dL (ref 70–99)

## 2019-12-17 LAB — TYPE AND SCREEN
ABO/RH(D): B POS
Antibody Screen: NEGATIVE

## 2019-12-17 LAB — APTT: aPTT: 24 seconds (ref 24–36)

## 2019-12-17 LAB — PROTIME-INR
INR: 0.9 (ref 0.8–1.2)
Prothrombin Time: 12.1 seconds (ref 11.4–15.2)

## 2019-12-17 MED ORDER — CALCIUM CARBONATE ANTACID 500 MG PO CHEW: 2.0000 | CHEWABLE_TABLET | ORAL | Status: DC | PRN

## 2019-12-17 MED ORDER — ONDANSETRON 4 MG PO TBDP
4.0000 mg | ORAL_TABLET | Freq: Four times a day (QID) | ORAL | Status: DC | PRN
  Filled 2019-12-17 (×2): qty 1

## 2019-12-17 MED ORDER — PRENATAL MULTIVITAMIN CH
1.0000 | ORAL_TABLET | Freq: Every day | ORAL | Status: DC
  Administered 2019-12-18: 1 via ORAL
  Filled 2019-12-17: qty 1

## 2019-12-17 MED ORDER — CLONAZEPAM 0.5 MG PO TABS
1.0000 mg | ORAL_TABLET | Freq: Two times a day (BID) | ORAL | Status: DC
  Administered 2019-12-18 (×3): 1 mg via ORAL
  Filled 2019-12-17: qty 2
  Filled 2019-12-17 (×3): qty 1

## 2019-12-17 MED ORDER — DIPHENHYDRAMINE HCL 50 MG/ML IJ SOLN
25.0000 mg | Freq: Every evening | INTRAMUSCULAR | Status: DC | PRN
  Administered 2019-12-18: 25 mg via INTRAVENOUS
  Filled 2019-12-17: qty 1

## 2019-12-17 MED ORDER — CLONIDINE HCL 0.1 MG PO TABS
0.3000 mg | ORAL_TABLET | Freq: Every day | ORAL | Status: DC
  Administered 2019-12-18 (×2): 0.3 mg via ORAL
  Filled 2019-12-17 (×4): qty 3

## 2019-12-17 MED ORDER — BACLOFEN 10 MG PO TABS
10.0000 mg | ORAL_TABLET | Freq: Three times a day (TID) | ORAL | Status: DC
  Administered 2019-12-17 – 2019-12-18 (×4): 10 mg via ORAL
  Filled 2019-12-17 (×7): qty 1

## 2019-12-17 MED ORDER — SODIUM CHLORIDE 0.9 % IV SOLN: INTRAVENOUS | Status: DC

## 2019-12-17 MED ORDER — ACETAMINOPHEN 325 MG PO TABS
650.0000 mg | ORAL_TABLET | ORAL | Status: DC | PRN
  Administered 2019-12-19: 650 mg via ORAL
  Filled 2019-12-17: qty 2

## 2019-12-17 MED ORDER — DEXTROSE-NACL 5-0.45 % IV SOLN: INTRAVENOUS | Status: DC

## 2019-12-17 MED ORDER — SODIUM CHLORIDE 0.9 % IV SOLN
1.5000 g | Freq: Four times a day (QID) | INTRAVENOUS | Status: DC
  Administered 2019-12-18 – 2019-12-19 (×6): 1.5 g via INTRAVENOUS
  Filled 2019-12-17 (×2): qty 4
  Filled 2019-12-17: qty 1.5
  Filled 2019-12-17 (×6): qty 4
  Filled 2019-12-17: qty 1.5
  Filled 2019-12-17: qty 4
  Filled 2019-12-17: qty 1.5

## 2019-12-17 MED ORDER — DOCUSATE SODIUM 100 MG PO CAPS
100.0000 mg | ORAL_CAPSULE | Freq: Every day | ORAL | Status: DC
  Administered 2019-12-18: 100 mg via ORAL
  Filled 2019-12-17: qty 1

## 2019-12-17 MED ORDER — DEXTROSE 50 % IV SOLN: 0.0000 mL | INTRAVENOUS | Status: DC | PRN

## 2019-12-17 MED ORDER — ALBUTEROL SULFATE (2.5 MG/3ML) 0.083% IN NEBU: 2.5000 mg | INHALATION_SOLUTION | Freq: Four times a day (QID) | RESPIRATORY_TRACT | Status: DC | PRN

## 2019-12-17 MED ORDER — INSULIN REGULAR(HUMAN) IN NACL 100-0.9 UT/100ML-% IV SOLN
INTRAVENOUS | Status: DC
  Administered 2019-12-18: 4.8 [IU]/h via INTRAVENOUS
  Filled 2019-12-17 (×2): qty 100

## 2019-12-17 MED ORDER — ALBUTEROL SULFATE HFA 108 (90 BASE) MCG/ACT IN AERS: 1.0000 | INHALATION_SPRAY | Freq: Four times a day (QID) | RESPIRATORY_TRACT | Status: DC | PRN

## 2019-12-17 MED ORDER — ONDANSETRON HCL 4 MG/2ML IJ SOLN
4.0000 mg | Freq: Four times a day (QID) | INTRAMUSCULAR | Status: DC | PRN
  Administered 2019-12-17 – 2019-12-19 (×4): 4 mg via INTRAVENOUS
  Filled 2019-12-17 (×4): qty 2

## 2019-12-17 MED ORDER — INSULIN GLARGINE 100 UNIT/ML ~~LOC~~ SOLN
32.0000 [IU] | Freq: Every day | SUBCUTANEOUS | Status: DC
  Administered 2019-12-17 – 2019-12-18 (×2): 32 [IU] via SUBCUTANEOUS
  Filled 2019-12-17 (×3): qty 0.32

## 2019-12-17 MED ORDER — ZOLPIDEM TARTRATE 5 MG PO TABS: 5.0000 mg | ORAL_TABLET | Freq: Every evening | ORAL | Status: DC | PRN

## 2019-12-17 NOTE — Progress Notes (Signed)
Pt refuses oral zofran. Pt states she prefers IV zofran. MD will be notified.

## 2019-12-17 NOTE — H&P (Signed)
Obstetrics Admission History & Physical   No chief complaint on file.   HPI:  29 y.o. G2P0010 @ [redacted]w[redacted]d (02/01/2020, by Ultrasound). Admitted on 12/17/2019:   Patient Active Problem List   Diagnosis Date Noted  . Tooth decay 12/17/2019  . Hyperglycemia in pregnancy 12/17/2019  . Pre-existing essential hypertension during pregnancy in third trimester 12/14/2019  . Anxiety in pregnancy in third trimester, antepartum 12/14/2019  . Seizure disorder during pregnancy, antepartum (HCC) 12/14/2019  . Hyperlipidemia 12/14/2019  . Paranoid personality (disorder) (HCC) 12/14/2019  . Asperger's syndrome 12/14/2019  . Supervision of high risk pregnancy, antepartum 12/11/2019  . Type 2 diabetes mellitus affecting pregnancy in third trimester, antepartum 12/10/2019  . Pre-existing type 1 diabetes mellitus during pregnancy in third trimester 12/10/2019     Presents for abnormal blood sugars in a pregnancy with diabetes diagnosed at late onset due to limited prenatal care, and she has been on Insulin with varying control based on careful daily monitoring.  Today she had high levels despite Insulin use and then minimizing intake, 2 levels >200.  She has recently finished a round of ABX for oral gingival infection.  This has been an ongoing issue for her but she has yet to see a dentist or oral surgeon for this.  She also reports vaginal discharge at times clear and watery f or two weeks and she sometimes has crampy lower abdominal pains.    ROS: A review of systems was performed and negative, except as stated in the above HPI. PMHx:  Past Medical History:  Diagnosis Date  . Anxiety   . Diabetes mellitus without complication (HCC)   . Headache   . Mental disorder    Depression. past suicide attempt  . Seizures (HCC)    PSHx:  Past Surgical History:  Procedure Laterality Date  . TONSILLECTOMY     Medications:  Medications Prior to Admission  Medication Sig Dispense Refill Last Dose  . albuterol  (VENTOLIN HFA) 108 (90 Base) MCG/ACT inhaler Inhale 1-2 puffs into the lungs every 6 (six) hours as needed for wheezing or shortness of breath.   12/16/2019 at Unknown time  . baclofen (LIORESAL) 10 MG tablet Take 10 mg by mouth 3 (three) times daily.   12/16/2019 at Unknown time  . Cholecalciferol (VITAMIN D3) 2400 UNIT/ML LIQD by Does not apply route.   12/16/2019 at Unknown time  . clonazePAM (KLONOPIN) 1 MG tablet Take 1 tablet (1 mg total) by mouth 2 (two) times daily. 60 tablet 5 12/16/2019 at Unknown time  . cloNIDine (CATAPRES) 0.3 MG tablet Take 1 tablet (0.3 mg total) by mouth daily. 30 tablet 11 12/16/2019 at Unknown time  . glucose blood (ACCU-CHEK AVIVA PLUS) test strip Use as instructed 100 each 12 12/17/2019 at Unknown time  . insulin glargine (LANTUS) 100 UNIT/ML injection Inject 32 Units into the skin daily.   12/16/2019 at Unknown time  . insulin lispro (HUMALOG) 100 UNIT/ML injection Inject 16 Units into the skin 3 (three) times daily before meals.   12/17/2019 at Unknown time  . Insulin Pen Needle (PEN NEEDLES) 31G X 6 MM MISC 1 each by Does not apply route 4 (four) times daily. 100 each 11 12/17/2019 at Unknown time  . metFORMIN (GLUCOPHAGE) 1000 MG tablet Take 1,000 mg by mouth 2 (two) times daily with a meal.   12/16/2019 at Unknown time  . Prenatal Vit-Fe Fumarate-FA (PRENATAL MULTIVITAMIN) TABS tablet Take 1 tablet by mouth daily at 12 noon.   12/16/2019 at Unknown  time  . trimethobenzamide (TIGAN) 300 MG capsule Take 1 capsule (300 mg total) by mouth 3 (three) times daily. 90 capsule 11 12/16/2019 at Unknown time   Allergies: is allergic to liraglutide; flexeril [cyclobenzaprine]; gabapentin; latex; manganese trace metal additives [manganese]; and other. OBHx:  OB History  Gravida Para Term Preterm AB Living  2       1    SAB TAB Ectopic Multiple Live Births  1            # Outcome Date GA Lbr Len/2nd Weight Sex Delivery Anes PTL Lv  2 Current           1 SAB 09/28/10            XAJ:OINOMVEH/MCNOBSJGGEZM except as detailed in HPI.[name]  No family history of birth defects.  She does report FH of Von Willebrands Disease. Soc Hx: Alcohol: none and Recreational drug use: none  Objective:   Vitals:   12/17/19 1552  BP: (!) 150/88  Pulse: 100  Resp: 17  Temp: 98.6 F (37 C)   Constitutional: Well nourished, well developed female in no acute distress.  HEENT: normal Skin: Warm and dry.  Cardiovascular:Regular rate and rhythm.   Extremity: trace to 1+ bilateral pedal edema Respiratory: Clear to auscultation bilateral. Normal respiratory effort Abdomen: gravid, ND, FHT present, mild tenderness on exam Back: no CVAT Neuro: DTRs 2+, Cranial nerves grossly intact Psych: Alert and Oriented x3. No memory deficits. Normal mood and affect.  MS: normal gait, normal bilateral lower extremity ROM/strength/stability.  Pelvic exam: is not limited by body habitus EGBUS: within normal limits Vagina: within normal limits and with normal mucosa, scant white d/c, no watery fluid, no pooling Cervix: EXTERNAL GENITALIA: normal appearing vulva with no masses, tenderness or lesions CERVIX: 0 cm dilated, 10 effaced, -3 station MEMBRANES: intact, NEG FERN Uterus: No contractions observed for 30 minutes.  Adnexa: not evaluated  A NST procedure was performed with FHR monitoring and a normal baseline established, appropriate time of 20-40 minutes of evaluation, and accels >2 seen w 10x10 characteristics.  Results show a REACTIVE NST.    Perinatal info:  Blood type: B positive Rubella- Immune Varicella -Unknown TDaP Not given during third trimester of this pregnancy RPR NR / HIV Neg/ HBsAg Neg   Assessment & Plan:   29 y.o. G2P0010 @ [redacted]w[redacted]d, Admitted on 12/17/2019: with hyperglycemia in pregnancy poorly controlled on current Insulin regimen   Also, vaginal discharge and leakage of fluid, without evidence for PPROM   Also, oral/gingival infection/ inflammation/ decay  Glucose  endotool Unasyn  Monitor diet, BS Test for VWD Consult oral surgery or dentistry, if possible MFM consult/ planned 1/25   Barnett Applebaum, MD, [place] 12/17/2019  5:15 PM

## 2019-12-17 NOTE — Progress Notes (Addendum)
Routine Prenatal Care Visit  Subjective  Joy [name] is a 29 y.o. G2P0010 at [redacted]w[redacted]d being seen today for ongoing prenatal care.  She is currently monitored for the following issues for this high-risk pregnancy and has Type 2 diabetes mellitus affecting pregnancy in third trimester, antepartum; Pre-existing type 1 diabetes mellitus during pregnancy in third trimester; Supervision of high risk pregnancy, antepartum; Pre-existing essential hypertension during pregnancy in third trimester; Anxiety in pregnancy in third trimester, antepartum; Seizure disorder during pregnancy, antepartum (HCC); Hyperlipidemia; Paranoid personality (disorder) (HCC); and Asperger's syndrome on their problem list.  ----------------------------------------------------------------------------------- Patient reports that her blood glucose has been elevated all day despite trying to not eat and take extra insulin. This usually happens when she has an infection. She has significant tooth problems and recently finished oral antibiotics for a right tooth abscess.  Significant too decay with teeth appearing black and brown. She reports her teeth fall apart while being brushed and her mouth feels full of sand. Has not seen an oral surgeon this pregnancy. Has been told she needs all of her teeth removed.    Contractions: Irregular. Vag. Bleeding: None.  Movement: Present. Denies leaking of fluid.  ----------------------------------------------------------------------------------- The following portions of the patient's history were reviewed and updated as appropriate: allergies, current medications, past family history, past medical history, past social history, past surgical history and problem list. Problem list updated.   Objective  Blood pressure 130/80, weight 217 lb (98.4 kg), last menstrual period 04/17/2019. Pregravid weight 195 lb (88.5 kg) Total Weight Gain 22 lb (9.979 kg) Urinalysis:      Fetal Status: Fetal  Heart Rate (bpm): 145   Movement: Present     General:  Alert, oriented and cooperative. Patient is in no acute distress.  Skin: Skin is warm and dry. No rash noted.   Cardiovascular: Normal heart rate noted  Respiratory: Normal respiratory effort, no problems with respiration noted  Abdomen: Soft, gravid, appropriate for gestational age. Pain/Pressure: Present     Pelvic:  Cervical exam deferred        Extremities: Normal range of motion.  Edema: None  Mental Status: Normal mood and affect. Normal behavior. Normal judgment and thought content.  Significant tooth decay.   NST: 145 bpm baseline, moderate variability, 15x15 accelerations, no decelerations. Reactive   Assessment   29 y.o. G2P0010 at [redacted]w[redacted]d by  02/01/2020, by Ultrasound presenting for routine prenatal visit  Plan   pregnancy Problems (from 12/09/19 to present)    Problem Noted Resolved   Pre-existing essential hypertension during pregnancy in third trimester 12/14/2019 by [name], MD No   Overview Signed 12/14/2019  7:17 AM by [name], MD    Taking clonidine 0.3mg  daily      Anxiety in pregnancy in third trimester, antepartum 12/14/2019 by [name], MD No   Seizure disorder during pregnancy, antepartum (HCC) 12/14/2019 by [name], MD No   Overview Signed 12/14/2019  7:17 AM by [name], MD    Hx of Seizures and Pseudoseizures      Supervision of high risk pregnancy, antepartum 12/11/2019 by [name], CMA No   Overview Addendum 12/14/2019  4:25 PM by Vena Austria, MD     Nursing Staff Provider  Office Location  [facility] Dating   12 wk [place]  Language  English Anatomy US   complete (outside institution)  Flu Vaccine   Declined Genetic Screen  NIPS: normal XX   TDaP vaccine   12/14/2019 Hgb  A1C or  GTT Third trimester : 6.9  Rhogam   not needed   LAB RESULTS   Feeding Plan  Blood Type B/Positive/-- (08/24 0000)   Contraception  Antibody  Negative (08/24 0000)  Circumcision  Rubella Immune (08/24 0000)  Pediatrician   RPR Nonreactive (08/24 0000)   Support Person  Boyfriend HBsAg Negative (08/24 0000)   Prenatal Classes  HIV Non-reactive, Non-reactive (08/24 0000)    Varicella   BTL Consent  GBS  (For PCN allergy, check sensitivities)        VBAC Consent  no needed Pap  collected 2021    Hgb Electro   not needed    CF  negative     SMA  negative        Normal Inheritest Normal Matern[place] 21 XX      Type 2 diabetes mellitus affecting pregnancy in third trimester, antepartum 12/10/2019 by [name], MD No   Overview Addendum 12/14/2019  2:31 PM by [name], MD    Lantus 32 units Humalog 16/16/16 Sliding scale  Growth scan 30 week (12/14/2019) 2149g or 4lbs 12oz c.w 89.1%ile AC 97.7%ile AFI 16.6cm          Gestational age appropriate obstetric precautions including but not limited to vaginal bleeding, contractions, leaking of fluid and fetal movement were reviewed in detail with the patient.    Sent to L&D for acute glucose management of uncontrolled blood glucose levels. Finger stick here in the office 210. She reports last eating 4 hours ago.  Will likely need antibiotics, possible oral infection.  Has close MFM follow up planned for 12/21/2019  No follow-ups on file.  [name] MD [facility] 12/17/2019, 3:17 PM

## 2019-12-17 NOTE — Progress Notes (Signed)
ROB/NST C/o nausea, pelvic pain especially in the morning, also feeling extreme pressure in pelvic area Feels like she is leaking still, no vb, Good FM

## 2019-12-18 DIAGNOSIS — F419 Anxiety disorder, unspecified: Secondary | ICD-10-CM | POA: Diagnosis present

## 2019-12-18 DIAGNOSIS — O99343 Other mental disorders complicating pregnancy, third trimester: Secondary | ICD-10-CM | POA: Diagnosis present

## 2019-12-18 DIAGNOSIS — Z3A33 33 weeks gestation of pregnancy: Secondary | ICD-10-CM | POA: Diagnosis not present

## 2019-12-18 DIAGNOSIS — O24013 Pre-existing diabetes mellitus, type 1, in pregnancy, third trimester: Secondary | ICD-10-CM | POA: Diagnosis not present

## 2019-12-18 DIAGNOSIS — Z794 Long term (current) use of insulin: Secondary | ICD-10-CM | POA: Diagnosis not present

## 2019-12-18 DIAGNOSIS — O26893 Other specified pregnancy related conditions, third trimester: Secondary | ICD-10-CM | POA: Diagnosis present

## 2019-12-18 DIAGNOSIS — O24113 Pre-existing diabetes mellitus, type 2, in pregnancy, third trimester: Secondary | ICD-10-CM | POA: Diagnosis present

## 2019-12-18 DIAGNOSIS — Z5329 Procedure and treatment not carried out because of patient's decision for other reasons: Secondary | ICD-10-CM | POA: Diagnosis present

## 2019-12-18 DIAGNOSIS — K0510 Chronic gingivitis, plaque induced: Secondary | ICD-10-CM | POA: Diagnosis present

## 2019-12-18 DIAGNOSIS — F600 Paranoid personality disorder: Secondary | ICD-10-CM | POA: Diagnosis present

## 2019-12-18 DIAGNOSIS — G40909 Epilepsy, unspecified, not intractable, without status epilepticus: Secondary | ICD-10-CM | POA: Diagnosis present

## 2019-12-18 DIAGNOSIS — Z20822 Contact with and (suspected) exposure to covid-19: Secondary | ICD-10-CM | POA: Diagnosis present

## 2019-12-18 DIAGNOSIS — F845 Asperger's syndrome: Secondary | ICD-10-CM | POA: Diagnosis present

## 2019-12-18 DIAGNOSIS — E1165 Type 2 diabetes mellitus with hyperglycemia: Secondary | ICD-10-CM | POA: Diagnosis present

## 2019-12-18 DIAGNOSIS — O99353 Diseases of the nervous system complicating pregnancy, third trimester: Secondary | ICD-10-CM | POA: Diagnosis present

## 2019-12-18 DIAGNOSIS — O10013 Pre-existing essential hypertension complicating pregnancy, third trimester: Secondary | ICD-10-CM | POA: Diagnosis present

## 2019-12-18 DIAGNOSIS — K029 Dental caries, unspecified: Secondary | ICD-10-CM | POA: Diagnosis present

## 2019-12-18 LAB — GLUCOSE, CAPILLARY
Glucose-Capillary: 106 mg/dL — ABNORMAL HIGH (ref 70–99)
Glucose-Capillary: 108 mg/dL — ABNORMAL HIGH (ref 70–99)
Glucose-Capillary: 117 mg/dL — ABNORMAL HIGH (ref 70–99)
Glucose-Capillary: 132 mg/dL — ABNORMAL HIGH (ref 70–99)
Glucose-Capillary: 133 mg/dL — ABNORMAL HIGH (ref 70–99)
Glucose-Capillary: 135 mg/dL — ABNORMAL HIGH (ref 70–99)
Glucose-Capillary: 135 mg/dL — ABNORMAL HIGH (ref 70–99)
Glucose-Capillary: 143 mg/dL — ABNORMAL HIGH (ref 70–99)
Glucose-Capillary: 146 mg/dL — ABNORMAL HIGH (ref 70–99)
Glucose-Capillary: 148 mg/dL — ABNORMAL HIGH (ref 70–99)
Glucose-Capillary: 177 mg/dL — ABNORMAL HIGH (ref 70–99)
Glucose-Capillary: 180 mg/dL — ABNORMAL HIGH (ref 70–99)
Glucose-Capillary: 222 mg/dL — ABNORMAL HIGH (ref 70–99)

## 2019-12-18 LAB — BASIC METABOLIC PANEL
Anion gap: 10 (ref 5–15)
BUN: 7 mg/dL (ref 6–20)
CO2: 25 mmol/L (ref 22–32)
Calcium: 8.9 mg/dL (ref 8.9–10.3)
Chloride: 103 mmol/L (ref 98–111)
Creatinine, Ser: 0.5 mg/dL (ref 0.44–1.00)
GFR calc Af Amer: >60 mL/min (ref >60)
GFR calc non Af Amer: >60 mL/min (ref >60)
Glucose, Bld: 137 mg/dL — ABNORMAL HIGH (ref 70–99)
Potassium: 4 mmol/L (ref 3.5–5.1)
Sodium: 138 mmol/L (ref 135–145)

## 2019-12-18 LAB — SARS CORONAVIRUS 2 (TAT 6-24 HRS): SARS Coronavirus 2: NEGATIVE

## 2019-12-18 MED ORDER — INSULIN ASPART 100 UNIT/ML ~~LOC~~ SOLN
0.0000 [IU] | SUBCUTANEOUS | Status: DC
  Administered 2019-12-18: 3 [IU] via SUBCUTANEOUS
  Administered 2019-12-18: 1 [IU] via SUBCUTANEOUS
  Administered 2019-12-19: 3 [IU] via SUBCUTANEOUS
  Filled 2019-12-18 (×4): qty 1

## 2019-12-18 MED ORDER — INSULIN ASPART 100 UNIT/ML ~~LOC~~ SOLN
12.0000 [IU] | Freq: Three times a day (TID) | SUBCUTANEOUS | Status: DC
  Administered 2019-12-18 – 2019-12-19 (×3): 12 [IU] via SUBCUTANEOUS
  Filled 2019-12-18 (×3): qty 1

## 2019-12-18 MED ORDER — METFORMIN HCL 500 MG PO TABS
1000.0000 mg | ORAL_TABLET | Freq: Two times a day (BID) | ORAL | Status: DC
  Administered 2019-12-18 – 2019-12-19 (×2): 1000 mg via ORAL
  Filled 2019-12-18 (×3): qty 2

## 2019-12-18 NOTE — Progress Notes (Signed)
S: Patient is doing well. She reports good fetal movement. She denies contractions, leakage of fluid or vaginal bleeding. She denies fever, headaches, feeling lightheaded. She reports issues with her glucose meter being inaccurate and that she will need a new meter. She reports dislike of hospital diet- she was unable to eat the one source of protein she ordered for breakfast. We discussed the importance of following a gestational carb modified diet and without continuous snacking so we can have a more accurate idea of what her home insulin needs will be. We discussed plan of care regarding new insulin orders and the possibility of an oral surgeon coming to see her while she is at [facility]. Per patient report, her tooth decay is due to anti-seizure medication prophylaxis as an infant.   O: Vital Signs: BP 113/65 (BP Location: Left Arm)   Pulse 82   Temp (!) 97.1 F (36.2 C) (Axillary)   Resp 18   Ht 5\' 5"  (1.651 m)   Wt 98 kg   LMP 04/17/2019   BMI 35.95 kg/m  Constitutional: Well nourished, well developed female in no acute distress.  HEENT: normal Skin: Warm and dry.  Cardiovascular: Regular rate and rhythm.   Extremity: no edema  Respiratory: Clear to auscultation bilateral. Normal respiratory effort Abdomen: FHT present Back: no CVAT Neuro: DTRs 2+, Cranial nerves grossly intact Psych: Alert and Oriented x3. No memory deficits. Normal mood and affect.   Toco: rare contraction Fetal well being: 130 bpm baseline, moderate variability, +accelerations, -decelerations Pelvic: deferred  Labs:   Results for LARUTH, HANGER (MRN [name]) as of 12/18/2019 11:52  Ref. Range 12/18/2019 06:03 12/18/2019 06:12 12/18/2019 07:19 12/18/2019 08:35 12/18/2019 09:40  Glucose-Capillary Latest Ref Range: 70 - 99 mg/dL  12/20/2019 (H) 355 (H) 732 (H) 135 (H)  BASIC METABOLIC PANEL Unknown Rpt (A)      Sodium Latest Ref Range: 135 - 145 mmol/L 138      Potassium Latest Ref Range: 3.5 - 5.1 mmol/L 4.0      Chloride  Latest Ref Range: 98 - 111 mmol/L 103      CO2 Latest Ref Range: 22 - 32 mmol/L 25      Glucose Latest Ref Range: 70 - 99 mg/dL 202 (H)      BUN Latest Ref Range: 6 - 20 mg/dL 7      Creatinine Latest Ref Range: 0.44 - 1.00 mg/dL 542      Calcium Latest Ref Range: 8.9 - 10.3 mg/dL 8.9      Anion gap Latest Ref Range: 5 - 15  10      GFR, Est Non African American Latest Ref Range: >60 mL/min >60      GFR, Est African American Latest Ref Range: >60 mL/min >60       New insulin orders per Diabetes Coordinator: Discontinue IV insulin Novolog 12 units [place] TID with meals Novolog 0-14 units sliding scale Q 4 hours postprandial and HS Lantus 32 units at HS Metformin: 1000 mg BID  Dr 7.06 is trying to arrange oral surgeon consult for patient  A: 29 yo female G2 P0010 with IUP at 33 weeks 4 days  Diabetes Mellitus:  Gestational carb modified diet Blood glucose monitoring per orders Insulin [place] per orders Metformin per orders  Tooth decay: Unasyn IV per orders Oral surgery consult  MFM consult planned 12/21/19   12/23/19, CNM [facility] 12/18/2019, 12:03 PM

## 2019-12-18 NOTE — Progress Notes (Addendum)
MD/ Attending Team:  If AM CBG elevated tomorrow (01/23) AM, please consider increasing Lantus dose as needed.  Please also remember to change the Novolog Correction scale (0-14 units) to 2-hour postprandial tomorrow AM     Addendum 2:30pm--  Met w/ pt at bedside to discuss home diabetes regimen.  Pt told me she has been taking Metformin since age 29 b/c of PCOS.  Was diagnosed w/ pre-diabetes in her late teens and then later diagnosed w/ Type 2 diabetes after high school.  Told me she was incorrectly diagnosed and that she really has Type 1 diabetes even though she has never had her antibodies tested.  Started Lantus and Humalog insulins 3 years ago and has had her doses adjusted by her MD.  Rockey Situ me her mom was diagnosed with Type 1 diabetes in her 76s.  Pt gets her Rxs currently filled by her OBGYN but is looking for a PCP.  Has appt to see [facility] on Monday 01/25.    Discussed w/ pt plans to check CBGs Q4 hours and give her SQ insulin.  Discussed w/ pt that her home insulin regimen may need to be adjusted prior to d/c and strongly encouraged pt to limit snacking and try to follow her prescribed gestational carb mod diet.  Reviewed home CBG goals for fasting and 2-hour postprandial.  Encouraged pt to avoid drinks with sugar like fruit juice, regular soda, sweet tea, etc.  Strongly encouraged pt to keep record of her CBGs the next few days prior to her appt w/ [facility].   --Will follow patient during hospitalization--  Wyn Quaker RN, MSN, CDE Diabetes Coordinator Inpatient Glycemic Control Team Team Pager: [phone] (8a-5p)

## 2019-12-18 NOTE — Progress Notes (Addendum)
ADA Standards of Care 2020 Diabetes in Pregnancy Target Glucose Ranges:  Fasting: 60 - 90 mg/dL Preprandial: 60 - 144 mg/dL 1 hr postprandial: Less than 140mg /dL (from first bite of meal) 2 hr postprandial: Less than 120 mg/dL (from first bit of meal)  Results for REMAS, SOBEL (MRN [name]) as of 12/18/2019 08:54  Ref. Range 12/17/2019 17:45 12/17/2019 20:02 12/17/2019 22:18 12/18/2019 00:29 12/18/2019 02:02 12/18/2019 03:10 12/18/2019 04:12 12/18/2019 05:13 12/18/2019 06:12 12/18/2019 07:19 12/18/2019 08:35  Glucose-Capillary Latest Ref Range: 70 - 99 mg/dL 94 96 84    32 units LANTUS given at 10:09pm 222 (H)  IV Insulin Drip Started at 1am 143 (H)  IV Insulin Drip 135 (H)  IV Insulin Drip 106 (H)  IV Insulin Drip 108 (H)  IV Insulin Drip 148 (H)  IV Insulin Drip 146 (H)  IV Insulin Drip 132 (H)  IV Insulin Drip    Admit with: [redacted]w[redacted]d/ Presents for abnormal blood sugars in a pregnancy with diabetes diagnosed at late onset due to limited prenatal care, and she has been on Insulin with varying control based on careful daily monitoring.  Today she had high levels despite Insulin use and then minimizing intake, 2 levels >200.  She has recently finished a round of ABX for oral gingival infection  History: Pre-existing Type 2 diabetes, Asperger's Syndrome  Home DM Meds: Lantus 32 units Daily       Humalog 16 units TID with meals       Metformin 1000 mg BID  Current Orders: IV Insulin Drip      Lantus 32 units QHS    [facility] appointment scheduled 12/20/2018.  Note that patient received 32 units Lantus last night at bedtime and then the IV Insulin Drip was initiated at 1am.    Note that patient has Lantus on board--Difficult to determine what pt's true basal rate is on the IV Insulin drip with SQ basal insulin on board.  6am BMET shows the following: Glucose 137 mg/dl  Anion Gap 10 CO2 level 25   MD- Looks like it would be safe for patient to transition to SQ  Insulin this AM.  Recommend Novolog Correction (SSI) Q4 hours for the next 24 hours to help determine how much extra Lantus patient may need at home.  Please consider the following:  1. Stop the IV Insulin Drip (Has Lantus on board from last PM)  2. Start Novolog 0-14 units Correction scale (SSI) (Sensitive scale) Q4 hours  Use the "Diabetes Treatment for Pregnant/Postpartum patients" Order set Please order Q4 hours to help better determine how much extra basal insulin patient may need   3. Start Novolog Meal Coverage as well: Recommend Novolog 12 units TID with meals to start (75% home dose)  4. Continue Lantus 32 units QHS (makes ure pt gets Lantus tonight 01/22)  5. Change PO diet to Gestational Carbohydrate Modified   6. Restart Metformin 1000 mg BID as well     --Will follow patient during hospitalization--  2/22 RN, MSN, CDE Diabetes Coordinator Inpatient Glycemic Control Team Team Pager: [phone] (8a-5p)

## 2019-12-19 ENCOUNTER — Other Ambulatory Visit: Payer: Self-pay | Admitting: Obstetrics and Gynecology

## 2019-12-19 DIAGNOSIS — K029 Dental caries, unspecified: Secondary | ICD-10-CM

## 2019-12-19 LAB — BASIC METABOLIC PANEL
Anion gap: 11 (ref 5–15)
BUN: 9 mg/dL (ref 6–20)
CO2: 21 mmol/L — ABNORMAL LOW (ref 22–32)
Calcium: 9.1 mg/dL (ref 8.9–10.3)
Chloride: 105 mmol/L (ref 98–111)
Creatinine, Ser: 0.52 mg/dL (ref 0.44–1.00)
GFR calc Af Amer: >60 mL/min (ref >60)
GFR calc non Af Amer: >60 mL/min (ref >60)
Glucose, Bld: 169 mg/dL — ABNORMAL HIGH (ref 70–99)
Potassium: 3.9 mmol/L (ref 3.5–5.1)
Sodium: 137 mmol/L (ref 135–145)

## 2019-12-19 LAB — GLUCOSE, CAPILLARY
Glucose-Capillary: 200 mg/dL — ABNORMAL HIGH (ref 70–99)
Glucose-Capillary: 190 mg/dL — ABNORMAL HIGH (ref 70–99)

## 2019-12-19 LAB — VON WILLEBRAND PANEL
Coagulation Factor VIII: 365 % — ABNORMAL HIGH (ref 56–140)
Ristocetin Co-factor, Plasma: 239 % — ABNORMAL HIGH (ref 50–200)
Von Willebrand Antigen, Plasma: 359 % — ABNORMAL HIGH (ref 50–200)

## 2019-12-19 LAB — COAG STUDIES INTERP REPORT

## 2019-12-19 MED ORDER — SODIUM CHLORIDE 0.9 % IV SOLN: INTRAVENOUS | Status: DC

## 2019-12-19 MED ORDER — INSULIN GLARGINE 100 UNIT/ML ~~LOC~~ SOLN
4.0000 [IU] | Freq: Once | SUBCUTANEOUS | Status: AC
  Administered 2019-12-19: 4 [IU] via SUBCUTANEOUS
  Filled 2019-12-19: qty 0.04

## 2019-12-19 MED ORDER — INSULIN ASPART 100 UNIT/ML ~~LOC~~ SOLN: 0.0000 [IU] | SUBCUTANEOUS | Status: DC

## 2019-12-19 MED ORDER — INSULIN ASPART 100 UNIT/ML ~~LOC~~ SOLN
4.0000 [IU] | Freq: Once | SUBCUTANEOUS | Status: AC
  Administered 2019-12-19: 4 [IU] via SUBCUTANEOUS

## 2019-12-19 MED ORDER — AMOXICILLIN-POT CLAVULANATE 875-125 MG PO TABS: 1.0000 | ORAL_TABLET | Freq: Two times a day (BID) | ORAL | 0 refills | Status: AC

## 2019-12-19 MED ORDER — INSULIN GLARGINE 100 UNIT/ML ~~LOC~~ SOLN
38.0000 [IU] | Freq: Every day | SUBCUTANEOUS | Status: DC
  Filled 2019-12-19: qty 0.38

## 2019-12-19 MED ORDER — INSULIN GLARGINE 100 UNIT/ML ~~LOC~~ SOLN: 38.0000 [IU] | Freq: Every day | SUBCUTANEOUS | 11 refills | Status: DC

## 2019-12-19 NOTE — Progress Notes (Addendum)
Spoke with diabetes coordinator, Kenda, this morning. Patient should still have her standard dose of 12 unitis of novolog, 3 times a day with meals. However, patient CBG testing orders should be changed to reflect 2 hours postprandial for Novolog coverage, per Enrique Sack, along with the fasting CBG.. Lantus may also need to be increased as patient had to have 4 extra units last night. Information relayed to provider,  Dr. Bonney Aid. RN will continue to monitor.  RN clarified "D5-1/2 9%NS if CBG<250" order with provider. Order to be discontinued. Patient to have 9%NS at 40ml/hr.

## 2019-12-19 NOTE — Progress Notes (Signed)
Patient ready to leave AMA. Provider aware and has spoken with patient. Pt. did not want to wait for RN to look through new orders or print AVS. Patient signed AMA form and was discharged via wheelchair pushed by staff. RN called patient to alert her of prescriptions that were sent to the pharmacy. Patient states she is aware and have picked them up. Patient states she looked at her discharge information through [place].

## 2019-12-19 NOTE — Progress Notes (Signed)
ADA Standards of Care 2020 Diabetes in Pregnancy Target Glucose Ranges:  Fasting: 60 - 90 mg/dL Preprandial: 60 - 670 mg/dL 1 hr postprandial: Less than 140mg /dL (from first bite of meal) 2 hr postprandial: Less than 120 mg/dL (from first bit of meal)    Spoke with staff RN in regards to getting orders changed for checking blood sugars fasting and 2 hour postprandials. Noted that patient received Lantus total of 36 units last night. Fasting CBG is 190 mg/dl. May need increase in dosage of Lantus.   Will continue to follow blood sugars while in the hospital.  RN BSN CDE Diabetes Coordinator Pager: [phone]  8am-5pm

## 2019-12-19 NOTE — Discharge Summary (Signed)
Physician Discharge Summary  Patient ID: Joy [name] MRN: [id number] DOB/AGE: 1991-06-26 29 y.o.  Admit date: 12/17/2019 Discharge date: 12/19/2019  Admission Diagnoses: Hyperglycemia in pregnancy  Discharge Diagnoses:  Active Problems:   Hyperglycemia in pregnancy   Discharged Condition: poor  Hospital Course: 29 y.o. G2P0010 at [redacted]w[redacted]d with pregnancy complicated by pre-existing insulin dependent diabetes.  She was admitted secondary to elevated blood sugars in the setting of likely odontogenic infection secondary to poor dentition and dental carries.  She was started on Unasyn and will be discharged on ampicillin.  Order was placed for outpatient referral to oral surgery.  It was recommended that patient stay for blood sugar optimization but the patient declined.  We discussed risk of DKA or hyperglycemic hyperosmolar non-ketotic coma and risks to her health as well as that of the fetus.  Patient declined continued admission.  Given that my recommendation was for continued admission I discussed that I would require her to sign out AMA.  She has follow up at [facility] on Monday 09/19/2020.  Consults: diabetic educator  Significant Diagnostic Studies:  Results for orders placed or performed during the hospital encounter of 12/17/19 (from the past 72 hour(s))  Type and screen [facility]     Status: None   Collection Time: 12/17/19  5:24 PM  Result Value Ref Range   ABO/RH(D) B POS    Antibody Screen NEG    Sample Expiration      12/20/2019,2359 Performed at [facility], [address] [place], [place] [address]   CBC on admission     Status: Abnormal   Collection Time: 12/17/19  5:24 PM  Result Value Ref Range   WBC 12.5 (H) 4.0 - 10.5 K/uL   RBC 4.48 3.87 - 5.11 MIL/uL   Hemoglobin 12.2 12.0 - 15.0 g/dL   HCT 51.8 84.1 - 66.0 %   MCV 82.6 80.0 - 100.0 fL   MCH 27.2 26.0 - 34.0 pg   MCHC 33.0 30.0 - 36.0 g/dL   RDW 63.0 16.0 - 10.9 %    Platelets 341 150 - 400 K/uL   nRBC 0.0 0.0 - 0.2 %    Comment: Performed at [facility], [address] [place], [place] [address]  APTT     Status: None   Collection Time: 12/17/19  5:24 PM  Result Value Ref Range   aPTT 24 24 - 36 seconds    Comment: Performed at [facility], [address] [place], [place] [address]  Protime-INR     Status: None   Collection Time: 12/17/19  5:24 PM  Result Value Ref Range   Prothrombin Time 12.1 11.4 - 15.2 seconds   INR 0.9 0.8 - 1.2    Comment: (NOTE) INR goal varies based on device and disease states. Performed at [facility], [address] [place], [place] [address]   Comprehensive metabolic panel     Status: Abnormal   Collection Time: 12/17/19  5:24 PM  Result Value Ref Range   Sodium 138 135 - 145 mmol/L   Potassium 3.8 3.5 - 5.1 mmol/L   Chloride 106 98 - 111 mmol/L   CO2 23 22 - 32 mmol/L   Glucose, Bld 103 (H) 70 - 99 mg/dL   BUN 6 6 - 20 mg/dL   Creatinine, Ser 0.62 0.44 - 1.00 mg/dL   Calcium 9.4 8.9 - 37.6 mg/dL   Total Protein 6.6 6.5 - 8.1 g/dL   Albumin 2.8 (L)  3.5 - 5.0 g/dL   AST 15 15 - 41 U/L   ALT 14 0 - 44 U/L   Alkaline Phosphatase 95 38 - 126 U/L   Total Bilirubin 0.4 0.3 - 1.2 mg/dL   GFR calc non Af Amer >60 >60 mL/min   GFR calc Af Amer >60 >60 mL/min   Anion gap 9 5 - 15    Comment: Performed at [facility], [address] [place], [place] [address]  Glucose, capillary     Status: None   Collection Time: 12/17/19  5:45 PM  Result Value Ref Range   Glucose-Capillary 94 70 - 99 mg/dL  ABO/Rh     Status: None   Collection Time: 12/17/19  6:10 PM  Result Value Ref Range   ABO/RH(D)      B POS Performed at [facility], [address] [place], [place] [address]   SARS CORONAVIRUS 2 (TAT 6-24 HRS) Nasopharyngeal Nasopharyngeal Swab     Status: None   Collection Time: 12/17/19  6:15 PM   Specimen: Nasopharyngeal Swab  Result Value Ref Range    SARS Coronavirus 2 NEGATIVE NEGATIVE    Comment: (NOTE) SARS-CoV-2 target nucleic acids are NOT DETECTED. The SARS-CoV-2 RNA is generally detectable in upper and lower respiratory specimens during the acute phase of infection. Negative results do not preclude SARS-CoV-2 infection, do not rule out co-infections with other pathogens, and should not be used as the sole basis for treatment or other patient management decisions. Negative results must be combined with clinical observations, patient history, and epidemiological information. The expected result is Negative. Fact Sheet for Patients: SugarRoll.be Fact Sheet for Healthcare Providers: https://www.woods-mathews.com/ This test is not yet approved or cleared by the [place] FDA and  has been authorized for detection and/or diagnosis of SARS-CoV-2 by FDA under an Emergency Use Authorization (EUA). This EUA will remain  in effect (meaning this test can be used) for the duration of the COVID-19 declaration under Section 56 4(b)(1) of the Act, 21 U.S.C. section 360bbb-3(b)(1), unless the authorization is terminated or revoked sooner. Performed at [facility], [place] [address], [place], [place] [address]   Glucose, capillary     Status: None   Collection Time: 12/17/19  8:02 PM  Result Value Ref Range   Glucose-Capillary 96 70 - 99 mg/dL  Glucose, capillary     Status: None   Collection Time: 12/17/19 10:18 PM  Result Value Ref Range   Glucose-Capillary 84 70 - 99 mg/dL  Glucose, capillary     Status: Abnormal   Collection Time: 12/18/19 12:29 AM  Result Value Ref Range   Glucose-Capillary 222 (H) 70 - 99 mg/dL  Glucose, capillary     Status: Abnormal   Collection Time: 12/18/19  2:02 AM  Result Value Ref Range   Glucose-Capillary 143 (H) 70 - 99 mg/dL  Glucose, capillary     Status: Abnormal   Collection Time: 12/18/19  3:10 AM  Result Value Ref Range   Glucose-Capillary 135  (H) 70 - 99 mg/dL  Glucose, capillary     Status: Abnormal   Collection Time: 12/18/19  4:12 AM  Result Value Ref Range   Glucose-Capillary 106 (H) 70 - 99 mg/dL  Glucose, capillary     Status: Abnormal   Collection Time: 12/18/19  5:13 AM  Result Value Ref Range   Glucose-Capillary 108 (H) 70 - 99 mg/dL  Basic metabolic panel     Status: Abnormal   Collection Time:  12/18/19  6:03 AM  Result Value Ref Range   Sodium 138 135 - 145 mmol/L   Potassium 4.0 3.5 - 5.1 mmol/L   Chloride 103 98 - 111 mmol/L   CO2 25 22 - 32 mmol/L   Glucose, Bld 137 (H) 70 - 99 mg/dL   BUN 7 6 - 20 mg/dL   Creatinine, Ser 0.35 0.44 - 1.00 mg/dL   Calcium 8.9 8.9 - 59.7 mg/dL   GFR calc non Af Amer >60 >60 mL/min   GFR calc Af Amer >60 >60 mL/min   Anion gap 10 5 - 15    Comment: Performed at [facility], [address] [place], [place] [address]  Glucose, capillary     Status: Abnormal   Collection Time: 12/18/19  6:12 AM  Result Value Ref Range   Glucose-Capillary 148 (H) 70 - 99 mg/dL  Glucose, capillary     Status: Abnormal   Collection Time: 12/18/19  7:19 AM  Result Value Ref Range   Glucose-Capillary 146 (H) 70 - 99 mg/dL  Glucose, capillary     Status: Abnormal   Collection Time: 12/18/19  8:35 AM  Result Value Ref Range   Glucose-Capillary 132 (H) 70 - 99 mg/dL  Glucose, capillary     Status: Abnormal   Collection Time: 12/18/19  9:40 AM  Result Value Ref Range   Glucose-Capillary 135 (H) 70 - 99 mg/dL  Glucose, capillary     Status: Abnormal   Collection Time: 12/18/19  3:24 PM  Result Value Ref Range   Glucose-Capillary 133 (H) 70 - 99 mg/dL  Glucose, capillary     Status: Abnormal   Collection Time: 12/18/19  4:08 PM  Result Value Ref Range   Glucose-Capillary 117 (H) 70 - 99 mg/dL  Glucose, capillary     Status: Abnormal   Collection Time: 12/18/19  7:53 PM  Result Value Ref Range   Glucose-Capillary 177 (H) 70 - 99 mg/dL  Glucose, capillary     Status: Abnormal    Collection Time: 12/18/19 11:54 PM  Result Value Ref Range   Glucose-Capillary 180 (H) 70 - 99 mg/dL  Glucose, capillary     Status: Abnormal   Collection Time: 12/19/19  3:44 AM  Result Value Ref Range   Glucose-Capillary 200 (H) 70 - 99 mg/dL  Basic metabolic panel     Status: Abnormal   Collection Time: 12/19/19  7:23 AM  Result Value Ref Range   Sodium 137 135 - 145 mmol/L   Potassium 3.9 3.5 - 5.1 mmol/L   Chloride 105 98 - 111 mmol/L   CO2 21 (L) 22 - 32 mmol/L   Glucose, Bld 169 (H) 70 - 99 mg/dL   BUN 9 6 - 20 mg/dL   Creatinine, Ser 4.53 0.44 - 1.00 mg/dL   Calcium 9.1 8.9 - 64.6 mg/dL   GFR calc non Af Amer >60 >60 mL/min   GFR calc Af Amer >60 >60 mL/min   Anion gap 11 5 - 15    Comment: Performed at [facility], [address] [place], [place] [address]  Glucose, capillary     Status: Abnormal   Collection Time: 12/19/19  8:10 AM  Result Value Ref Range   Glucose-Capillary 190 (H) 70 - 99 mg/dL   Comment 1 Notify RN      Treatments: IV antibiotics and insulin  Discharge Exam: Blood pressure 124/74, pulse 80, temperature 98.4 F (36.9 C), temperature source Oral, resp. rate 20, height 5'  5" (1.651 m), weight 98 kg, last menstrual period 04/17/2019, SpO2 98 %. General appearance: alert, appears stated age and no distress GI: soft, gravid, non-tender Extremities: extremities normal, atraumatic, no cyanosis or edema  Disposition: Discharge disposition: 07-Left Against Medical Advice/Left Without Being Seen/Elopement       Discharge Instructions    Discharge activity:  No Restrictions   Complete by: As directed    Discharge diet:  No restrictions   Complete by: As directed    Fetal Kick Count:  Lie on our left side for one hour after a meal, and count the number of times your baby kicks.  If it is less than 5 times, get up, move around and drink some juice.  Repeat the test 30 minutes later.  If it is still less than 5 kicks in an hour, notify  your doctor.   Complete by: As directed    LABOR:  When conractions begin, you should start to time them from the beginning of one contraction to the beginning  of the next.  When contractions are 5 - 10 minutes apart or less and have been regular for at least an hour, you should call your health care provider.   Complete by: As directed    No sexual activity restrictions   Complete by: As directed    Notify physician for bleeding from the vagina   Complete by: As directed    Notify physician for blurring of vision or spots before the eyes   Complete by: As directed    Notify physician for chills or fever   Complete by: As directed    Notify physician for fainting spells, "black outs" or loss of consciousness   Complete by: As directed    Notify physician for increase in vaginal discharge   Complete by: As directed    Notify physician for leaking of fluid   Complete by: As directed    Notify physician for pain or burning when urinating   Complete by: As directed    Notify physician for pelvic pressure (sudden increase)   Complete by: As directed    Notify physician for severe or continued nausea or vomiting   Complete by: As directed    Notify physician for sudden gushing of fluid from the vagina (with or without continued leaking)   Complete by: As directed    Notify physician for sudden, constant, or occasional abdominal pain   Complete by: As directed    Notify physician if baby moving less than usual   Complete by: As directed      Allergies as of 12/19/2019      Reactions   Liraglutide    Drug induced acute pancreatitis.    Flexeril [cyclobenzaprine]    Mood changes   Gabapentin Other (See Comments)   Mood changes   Latex Hives   Manganese Trace Metal Additives [manganese] Rash   Other Rash      Medication List    TAKE these medications   Accu-Chek Aviva Plus test strip Generic drug: glucose blood Use as instructed   albuterol 108 (90 Base) MCG/ACT  inhaler Commonly known as: VENTOLIN HFA Inhale 1-2 puffs into the lungs every 6 (six) hours as needed for wheezing or shortness of breath.   amoxicillin-clavulanate 875-125 MG tablet Commonly known as: Augmentin Take 1 tablet by mouth 2 (two) times daily for 10 days.   baclofen 10 MG tablet Commonly known as: LIORESAL Take 10 mg by mouth 3 (three) times daily.  clonazePAM 1 MG tablet Commonly known as: KLONOPIN Take 1 tablet (1 mg total) by mouth 2 (two) times daily.   cloNIDine 0.3 MG tablet Commonly known as: CATAPRES Take 1 tablet (0.3 mg total) by mouth daily.   insulin glargine 100 UNIT/ML injection Commonly known as: LANTUS Inject 0.38 mLs (38 Units total) into the skin daily. What changed: how much to take   insulin lispro 100 UNIT/ML injection Commonly known as: HUMALOG Inject 16 Units into the skin 3 (three) times daily before meals.   metFORMIN 1000 MG tablet Commonly known as: GLUCOPHAGE Take 1,000 mg by mouth 2 (two) times daily with a meal.   Pen Needles 31G X 6 MM Misc 1 each by Does not apply route 4 (four) times daily.   prenatal multivitamin Tabs tablet Take 1 tablet by mouth daily at 12 noon.   trimethobenzamide 300 MG capsule Commonly known as: TIGAN Take 1 capsule (300 mg total) by mouth 3 (three) times daily.   Vitamin D3 2400 UNIT/ML Liqd by Does not apply route.        Signed: Vena Austriandreas Taysom Glymph 12/19/2019, 10:35 AM

## 2019-12-21 ENCOUNTER — Other Ambulatory Visit: Payer: Self-pay | Admitting: Obstetrics and Gynecology

## 2019-12-21 ENCOUNTER — Ambulatory Visit
Admission: RE | Admit: 2019-12-21 | Discharge: 2019-12-21 | Disposition: A | Discharge status: Home / Self Care | Payer: Medicaid Other | Source: Ambulatory Visit | Attending: Obstetrics and Gynecology | Admitting: Obstetrics and Gynecology

## 2019-12-21 ENCOUNTER — Other Ambulatory Visit: Payer: Self-pay

## 2019-12-21 ENCOUNTER — Ambulatory Visit: Payer: Medicaid Other

## 2019-12-21 DIAGNOSIS — O99343 Other mental disorders complicating pregnancy, third trimester: Secondary | ICD-10-CM

## 2019-12-21 DIAGNOSIS — Z79899 Other long term (current) drug therapy: Secondary | ICD-10-CM | POA: Insufficient documentation

## 2019-12-21 DIAGNOSIS — Z3689 Encounter for other specified antenatal screening: Secondary | ICD-10-CM

## 2019-12-21 DIAGNOSIS — F17210 Nicotine dependence, cigarettes, uncomplicated: Secondary | ICD-10-CM | POA: Diagnosis not present

## 2019-12-21 DIAGNOSIS — J4520 Mild intermittent asthma, uncomplicated: Secondary | ICD-10-CM

## 2019-12-21 DIAGNOSIS — Z792 Long term (current) use of antibiotics: Secondary | ICD-10-CM | POA: Insufficient documentation

## 2019-12-21 DIAGNOSIS — O0990 Supervision of high risk pregnancy, unspecified, unspecified trimester: Secondary | ICD-10-CM

## 2019-12-21 DIAGNOSIS — O10013 Pre-existing essential hypertension complicating pregnancy, third trimester: Secondary | ICD-10-CM

## 2019-12-21 DIAGNOSIS — Z9102 Food additives allergy status: Secondary | ICD-10-CM | POA: Diagnosis not present

## 2019-12-21 DIAGNOSIS — Z8249 Family history of ischemic heart disease and other diseases of the circulatory system: Secondary | ICD-10-CM | POA: Diagnosis not present

## 2019-12-21 DIAGNOSIS — Z3A34 34 weeks gestation of pregnancy: Secondary | ICD-10-CM | POA: Diagnosis not present

## 2019-12-21 DIAGNOSIS — G40909 Epilepsy, unspecified, not intractable, without status epilepticus: Secondary | ICD-10-CM

## 2019-12-21 DIAGNOSIS — Z818 Family history of other mental and behavioral disorders: Secondary | ICD-10-CM | POA: Insufficient documentation

## 2019-12-21 DIAGNOSIS — Z794 Long term (current) use of insulin: Secondary | ICD-10-CM | POA: Diagnosis not present

## 2019-12-21 DIAGNOSIS — F845 Asperger's syndrome: Secondary | ICD-10-CM

## 2019-12-21 DIAGNOSIS — J45909 Unspecified asthma, uncomplicated: Secondary | ICD-10-CM | POA: Diagnosis not present

## 2019-12-21 DIAGNOSIS — Z800 Family history of malignant neoplasm of digestive organs: Secondary | ICD-10-CM | POA: Insufficient documentation

## 2019-12-21 DIAGNOSIS — O24113 Pre-existing diabetes mellitus, type 2, in pregnancy, third trimester: Secondary | ICD-10-CM

## 2019-12-21 DIAGNOSIS — E119 Type 2 diabetes mellitus without complications: Secondary | ICD-10-CM | POA: Insufficient documentation

## 2019-12-21 DIAGNOSIS — E66812 Obesity, class 2: Secondary | ICD-10-CM

## 2019-12-21 DIAGNOSIS — F600 Paranoid personality disorder: Secondary | ICD-10-CM

## 2019-12-21 DIAGNOSIS — O99350 Diseases of the nervous system complicating pregnancy, unspecified trimester: Secondary | ICD-10-CM

## 2019-12-21 DIAGNOSIS — I10 Essential (primary) hypertension: Secondary | ICD-10-CM | POA: Diagnosis not present

## 2019-12-21 DIAGNOSIS — Z836 Family history of other diseases of the respiratory system: Secondary | ICD-10-CM | POA: Diagnosis not present

## 2019-12-21 DIAGNOSIS — Z915 Personal history of self-harm: Secondary | ICD-10-CM | POA: Diagnosis not present

## 2019-12-21 DIAGNOSIS — I4581 Long QT syndrome: Secondary | ICD-10-CM

## 2019-12-21 DIAGNOSIS — Z833 Family history of diabetes mellitus: Secondary | ICD-10-CM | POA: Diagnosis not present

## 2019-12-21 DIAGNOSIS — E282 Polycystic ovarian syndrome: Secondary | ICD-10-CM

## 2019-12-21 DIAGNOSIS — E669 Obesity, unspecified: Secondary | ICD-10-CM | POA: Insufficient documentation

## 2019-12-21 DIAGNOSIS — F17219 Nicotine dependence, cigarettes, with unspecified nicotine-induced disorders: Secondary | ICD-10-CM | POA: Insufficient documentation

## 2019-12-21 DIAGNOSIS — F17200 Nicotine dependence, unspecified, uncomplicated: Secondary | ICD-10-CM

## 2019-12-21 DIAGNOSIS — Z9109 Other allergy status, other than to drugs and biological substances: Secondary | ICD-10-CM | POA: Insufficient documentation

## 2019-12-21 DIAGNOSIS — Z91040 Latex allergy status: Secondary | ICD-10-CM | POA: Insufficient documentation

## 2019-12-21 DIAGNOSIS — O99810 Abnormal glucose complicating pregnancy: Secondary | ICD-10-CM

## 2019-12-21 DIAGNOSIS — O99213 Obesity complicating pregnancy, third trimester: Secondary | ICD-10-CM | POA: Insufficient documentation

## 2019-12-21 DIAGNOSIS — F419 Anxiety disorder, unspecified: Secondary | ICD-10-CM | POA: Insufficient documentation

## 2019-12-21 DIAGNOSIS — Z882 Allergy status to sulfonamides status: Secondary | ICD-10-CM | POA: Diagnosis not present

## 2019-12-21 DIAGNOSIS — K029 Dental caries, unspecified: Secondary | ICD-10-CM

## 2019-12-21 DIAGNOSIS — Q270 Congenital absence and hypoplasia of umbilical artery: Secondary | ICD-10-CM

## 2019-12-21 DIAGNOSIS — O99513 Diseases of the respiratory system complicating pregnancy, third trimester: Secondary | ICD-10-CM | POA: Insufficient documentation

## 2019-12-21 DIAGNOSIS — Z888 Allergy status to other drugs, medicaments and biological substances status: Secondary | ICD-10-CM | POA: Insufficient documentation

## 2019-12-21 DIAGNOSIS — O99333 Smoking (tobacco) complicating pregnancy, third trimester: Secondary | ICD-10-CM | POA: Diagnosis not present

## 2019-12-21 DIAGNOSIS — Z6835 Body mass index (BMI) 35.0-35.9, adult: Secondary | ICD-10-CM

## 2019-12-21 DIAGNOSIS — E785 Hyperlipidemia, unspecified: Secondary | ICD-10-CM

## 2019-12-21 HISTORY — DX: Long QT syndrome: I45.81

## 2019-12-21 HISTORY — DX: Asperger's syndrome: F84.5

## 2019-12-21 LAB — GLUCOSE, CAPILLARY: Glucose-Capillary: 91 mg/dL (ref 70–99)

## 2019-12-21 NOTE — Progress Notes (Signed)
[place] Consultation   Chief Complaint: type 2  diabetes in pregnancy, HTN,  anxiety in pregnancy, h/o seizures    HPI: [name] is a 29 y.o. G2P0010 SWF at [redacted]w[redacted]d by earliest ultrasound performed at the [facility] in [place] who presents in consultation from  [facility]  for Type 2 diabetes and HYpertension in pregnancy   Social issues  Patient is originally form [place] until she relocated to  [place] near [place] ,[place]. She is now living with her boyfriend Legrand Como in his godmother's Michelle's house . They are relying on her SSI income to survive. Legrand Como is looking for a job . They have been together for 2 years . They feel like they need their own place soon before the baby comes as Sharyn Lull is married and is caring for her own  grandchildren. [place] cuts him off and tells him to stop speaking regularly.  They rely on others for transportation.  Her records from [place] describe her reliance on her mother who is now living in [place] with her brother due to medical issues " Her mother " has had cancer 3 times and was in hospice and graduate. I had to give her CPR once" . Her mother also has diabetes and has had DKA - "late onset Type 1" per patient . The patient expresses concern that since this is a girl, the baby will have the same issues she and her mother have had.   Diabetes  She has a long history of Type 2 diabetes on metformin and lantus 38,  humalog 16 tid , she had a hgb A1c of 6.2 on 06/30/2019 , she had been treated with glipizide other oral agents in the past . In her [facility] record listed as type 1 or 2 . NO h/o DKA . No C peptide listed. She feels like she does much better on insulin and was grateful for admission to improved her sugars 1/21-1/23 - her chartimg shows normal glucoses yesterday and RBS today is 91   Hypertension H/o HTN - she has been on  Clonidine ( still on) , propanolol , and spironolactone ( pt doesn't recall  maybe just for her PCOS ) she noted that other BP meds caused her hypotension periodically likes the Clonidine .    Mental health issues  She has a long history of mental health issues , paranoid personality disorder, Aspergers syndrome , insomnia ( "one time I went 5 days" )  school issues  , h/o suicide attempt listed and Haldol, trazodone , prozac and Klonopin listed as past meds , having stopped everything but Klonopin prn once pregnant . She spoke quickly and constantly through interview, she cut off her partner roughly several times   Asthma  H/o asthma with albuterol inhaler prn - uses daily  Prolonged bleeding  after SAB  H/o "Von Willebrand's disease" listed with a h/o hemorrhage with last miscarriage - recent VWD panel shows supra -normal ranges c/w pregnancy   Hyperlipidemia  H/o hyperlipidemia now off gemfibrozil and crestor.  Vanishing twin - pt followed early on at [facility] in [place] and was told twins .In her records form [place] they report lost twin B at 9 weeks - pt says 59 . No residual sac seen today .  Smoker Understands need to avoid and has cut back, Legrand Como also smokes   Long Q T syndrome  Unclear when diagnosed - no EKG on chart   "Total body pain " Uses baclofen  for spasms , considering forgoing breast feeding due to needs for meds    Past Medical History: Patient  has a past medical history of Anxiety, Asperger's syndrome, Diabetes mellitus without complication (HCC), Headache, Long Q-T syndrome, Mental disorder, and Seizures (HCC).  Past Surgical History: She  has a past surgical history that includes Tonsillectomy.  Obstetric History:  OB History    Gravida  2   Para      Term      Preterm      AB  1   Living        SAB  1   TAB      Ectopic      Multiple      Live Births             Gynecologic History:  Patient's last menstrual period was 04/17/2019.   Medications:  Current Outpatient Medications:  .  albuterol  (VENTOLIN HFA) 108 (90 Base) MCG/ACT inhaler, Inhale 1-2 puffs into the lungs every 6 (six) hours as needed for wheezing or shortness of breath., Disp: , Rfl:  .  amoxicillin-clavulanate (AUGMENTIN) 875-125 MG tablet, Take 1 tablet by mouth 2 (two) times daily for 10 days., Disp: 20 tablet, Rfl: 0 .  baclofen (LIORESAL) 10 MG tablet, Take 10 mg by mouth 3 (three) times daily., Disp: , Rfl:  .  Cholecalciferol (VITAMIN D3) 2400 UNIT/ML LIQD, by Does not apply route. D2, Disp: , Rfl:  .  clonazePAM (KLONOPIN) 1 MG tablet, Take 1 tablet (1 mg total) by mouth 2 (two) times daily., Disp: 60 tablet, Rfl: 5 .  cloNIDine (CATAPRES) 0.3 MG tablet, Take 1 tablet (0.3 mg total) by mouth daily., Disp: 30 tablet, Rfl: 11 .  insulin glargine (LANTUS) 100 UNIT/ML injection, Inject 0.38 mLs (38 Units total) into the skin daily., Disp: 10 mL, Rfl: 11 .  insulin lispro (HUMALOG) 100 UNIT/ML injection, Inject 16 Units into the skin 3 (three) times daily before meals., Disp: , Rfl:  .  Insulin Pen Needle (PEN NEEDLES) 31G X 6 MM MISC, 1 each by Does not apply route 4 (four) times daily., Disp: 100 each, Rfl: 11 .  metFORMIN (GLUCOPHAGE) 1000 MG tablet, Take 1,000 mg by mouth 2 (two) times daily with a meal., Disp: , Rfl:  .  Prenatal Vit-Fe Fumarate-FA (PRENATAL MULTIVITAMIN) TABS tablet, Take 1 tablet by mouth daily at 12 noon., Disp: , Rfl:  .  trimethobenzamide (TIGAN) 300 MG capsule, Take 1 capsule (300 mg total) by mouth 3 (three) times daily., Disp: 90 capsule, Rfl: 11 Allergies: Patient is allergic to liraglutide; flexeril [cyclobenzaprine]; gabapentin; latex; manganese trace metal additives [manganese]; and other.  Social History: Patient  reports that she has been smoking cigarettes. She has never used smokeless tobacco. She reports previous drug use. She reports that she does not drink alcohol.  Family History: family history includes Alzheimer's disease in her maternal grandfather; Asthma in her maternal  grandmother; COPD in her mother; Cancer in her mother; Diabetes in her mother; Heart disease in her father and maternal grandmother; Multiple sclerosis in her maternal grandmother and mother; Parkinson's disease in her maternal grandmother; Supraventricular tachycardia in her brother; Thyroid cancer in her mother; Von Willebrand disease in her mother.  Review of Systems dental abscess , total body pain  Physical Exam: LMP 04/17/2019   65 inches weight 215 lbs  BMI 35  BP 136/88  Pulse 116 Temp 98.1 rr 18 Pulse ox 99%  Obese talkative WF with poor  dentition  RBS 91  Asessement: IUP at 34 w 0d  Type 2 diabetes on metformin 1000 bid , lantus 38 units daily and humulin 16 units before meals- today with mild polyhydramnios and increased AC size   H/o chronic hypertension -stable now  H/o mental health issues - pt understands risk for decompensation pp  H/o asthma has an albuterol inhaler  SUA - no other anomalies  No evidence of VWD  Total body pain spasms on baclofen  Long QT  H/o seizures /no sz x 5 months  Plan: 1. Continue lantus humalog metformin as currently rxd - encouraged to keep log - doing well   2. Continue clonidine watch for superimposed preeclampsia   3 weekly NST AFI , increase to twice weekly at 36 weeks - scheduled here next week , consider induction at 37 weeks due to IDDM and fetopathy  4 h/o hemorrhage with SAB avoid methergine and hemabate if possible given her asthma and HTN history   5. Identify mental health support for postpartum period and  6 perhaps refer to parenting classes the couple appears eager to do a good job but have little support   7. Baseline EKG  8 hgb A1c at next draw , consider a C peptide to sort out the type 1 versus type 2 versus type 1.5  9 I think bottle feeding might be preferable given  her mutliple meds and medical issues,  to insure she sleeps to avoid psych decompensation or seizures  and to assure infant is fed consistently      Total time spent with the patient was 30 minutes with greater than 50% spent in counseling and coordination of care. We appreciate this interesting consult and will be happy to be involved in the ongoing care of Ms. Bally in anyway her obstetricians desire.  Jimmey Ralph MD   Maternal-Fetal Medicine [facility]

## 2019-12-21 NOTE — ED Notes (Signed)
Requested [place] records from [facility] in [place] for MFM via telephone recording.  Also checked with [facility] to see if they had any [place] records in the office and they did not.

## 2019-12-24 ENCOUNTER — Other Ambulatory Visit: Payer: Self-pay | Admitting: Obstetrics and Gynecology

## 2019-12-24 DIAGNOSIS — O163 Unspecified maternal hypertension, third trimester: Secondary | ICD-10-CM

## 2019-12-24 DIAGNOSIS — E1169 Type 2 diabetes mellitus with other specified complication: Secondary | ICD-10-CM

## 2019-12-28 ENCOUNTER — Ambulatory Visit (INDEPENDENT_AMBULATORY_CARE_PROVIDER_SITE_OTHER): Payer: Medicaid Other | Admitting: Obstetrics and Gynecology

## 2019-12-28 ENCOUNTER — Ambulatory Visit (HOSPITAL_BASED_OUTPATIENT_CLINIC_OR_DEPARTMENT_OTHER)
Admission: RE | Admit: 2019-12-28 | Discharge: 2019-12-28 | Disposition: A | Discharge status: Home / Self Care | Payer: Medicaid Other | Source: Ambulatory Visit | Attending: Obstetrics and Gynecology | Admitting: Obstetrics and Gynecology

## 2019-12-28 ENCOUNTER — Ambulatory Visit
Admission: RE | Admit: 2019-12-28 | Discharge: 2019-12-28 | Disposition: A | Discharge status: Home / Self Care | Payer: Medicaid Other | Source: Ambulatory Visit | Attending: Obstetrics and Gynecology | Admitting: Obstetrics and Gynecology

## 2019-12-28 ENCOUNTER — Other Ambulatory Visit: Payer: Self-pay

## 2019-12-28 VITALS — BP 142/80 | Wt 217.0 lb

## 2019-12-28 DIAGNOSIS — J4520 Mild intermittent asthma, uncomplicated: Secondary | ICD-10-CM

## 2019-12-28 DIAGNOSIS — O24113 Pre-existing diabetes mellitus, type 2, in pregnancy, third trimester: Secondary | ICD-10-CM | POA: Diagnosis not present

## 2019-12-28 DIAGNOSIS — G40909 Epilepsy, unspecified, not intractable, without status epilepticus: Secondary | ICD-10-CM

## 2019-12-28 DIAGNOSIS — Z3A35 35 weeks gestation of pregnancy: Secondary | ICD-10-CM

## 2019-12-28 DIAGNOSIS — O10013 Pre-existing essential hypertension complicating pregnancy, third trimester: Secondary | ICD-10-CM

## 2019-12-28 DIAGNOSIS — Q270 Congenital absence and hypoplasia of umbilical artery: Secondary | ICD-10-CM | POA: Diagnosis not present

## 2019-12-28 DIAGNOSIS — F600 Paranoid personality disorder: Secondary | ICD-10-CM

## 2019-12-28 DIAGNOSIS — Z79899 Other long term (current) drug therapy: Secondary | ICD-10-CM | POA: Insufficient documentation

## 2019-12-28 DIAGNOSIS — Z794 Long term (current) use of insulin: Secondary | ICD-10-CM | POA: Diagnosis not present

## 2019-12-28 DIAGNOSIS — O99350 Diseases of the nervous system complicating pregnancy, unspecified trimester: Secondary | ICD-10-CM

## 2019-12-28 DIAGNOSIS — O0990 Supervision of high risk pregnancy, unspecified, unspecified trimester: Secondary | ICD-10-CM | POA: Diagnosis not present

## 2019-12-28 DIAGNOSIS — E66812 Morbid (severe) obesity due to excess calories: Secondary | ICD-10-CM

## 2019-12-28 DIAGNOSIS — E1169 Type 2 diabetes mellitus with other specified complication: Secondary | ICD-10-CM

## 2019-12-28 DIAGNOSIS — O99891 Other specified diseases and conditions complicating pregnancy: Secondary | ICD-10-CM

## 2019-12-28 DIAGNOSIS — O163 Unspecified maternal hypertension, third trimester: Secondary | ICD-10-CM

## 2019-12-28 DIAGNOSIS — I517 Cardiomegaly: Secondary | ICD-10-CM

## 2019-12-28 DIAGNOSIS — F419 Anxiety disorder, unspecified: Secondary | ICD-10-CM

## 2019-12-28 DIAGNOSIS — M549 Dorsalgia, unspecified: Secondary | ICD-10-CM

## 2019-12-28 DIAGNOSIS — E282 Polycystic ovarian syndrome: Secondary | ICD-10-CM

## 2019-12-28 DIAGNOSIS — F17200 Nicotine dependence, unspecified, uncomplicated: Secondary | ICD-10-CM

## 2019-12-28 DIAGNOSIS — I4581 Long QT syndrome: Secondary | ICD-10-CM

## 2019-12-28 DIAGNOSIS — O99353 Diseases of the nervous system complicating pregnancy, third trimester: Secondary | ICD-10-CM

## 2019-12-28 DIAGNOSIS — O0993 Supervision of high risk pregnancy, unspecified, third trimester: Secondary | ICD-10-CM

## 2019-12-28 DIAGNOSIS — O99343 Other mental disorders complicating pregnancy, third trimester: Secondary | ICD-10-CM

## 2019-12-28 DIAGNOSIS — Z6835 Body mass index (BMI) 35.0-35.9, adult: Secondary | ICD-10-CM

## 2019-12-28 LAB — POCT URINALYSIS DIPSTICK OB: Glucose, UA: NEGATIVE

## 2019-12-28 LAB — FETAL NONSTRESS TEST

## 2019-12-28 LAB — GLUCOSE, CAPILLARY: Glucose-Capillary: 89 mg/dL (ref 70–99)

## 2019-12-28 MED ORDER — INSULIN LISPRO (1 UNIT DIAL) 100 UNIT/ML (KWIKPEN): 18.0000 [IU] | PEN_INJECTOR | Freq: Three times a day (TID) | SUBCUTANEOUS | 11 refills | Status: DC

## 2019-12-28 NOTE — Progress Notes (Addendum)
Routine Prenatal Care Visit  Subjective  Joy [name] is a 29 y.o. G2P0010 at [redacted]w[redacted]d being seen today for ongoing prenatal care.  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  ----------------------------------------------------------------------------------- Patient reports no complaints.   Contractions: Irregular. Vag. Bleeding: None.  Movement: Present. Denies leaking of fluid.  ----------------------------------------------------------------------------------- The following portions of the patient's history were reviewed and updated as appropriate: allergies, current medications, past family history, past medical history, past social history, past surgical history and problem list. Problem list updated.   Objective  Blood pressure (!) 142/80, weight 217 lb (98.4 kg), last menstrual period 04/17/2019. Pregravid weight 195 lb (88.5 kg) Total Weight Gain 22 lb (9.979 kg) Urinalysis:      Fetal Status: Fetal Heart Rate (bpm): 150   Movement: Present  Presentation: Vertex  General:  Alert, oriented and cooperative. Patient is in no acute distress.  Skin: Skin is warm and dry. No rash noted.   Cardiovascular: Normal heart rate noted  Respiratory: Normal respiratory effort, no problems with respiration noted  Abdomen: Soft, gravid, appropriate for gestational age.  Pain/Pressure: Present     Pelvic:  Cervical exam deferred        Extremities: Normal range of motion.     ental Status: Normal mood and affect. Normal behavior. Normal judgment and thought content.     Assessment   29 y.o. G2P0010 at [redacted]w[redacted]d by  02/01/2020, by Ultrasound presenting for routine prenatal visit  Plan   pregnancy Problems (from 12/09/19 to present)    Problem Noted Resolved   Pre-existing essential hypertension during pregnancy in third trimester 12/14/2019 by [name], MD No   Overview Addendum 12/21/2019  1:49 PM by Gatha Mayer, MD    Taking clonidine 0.3mg  daily Had been on propanolol in the past  Pt reports difficulty with many BP agents due to hypotension      Anxiety in pregnancy in third trimester, antepartum 12/14/2019 by [name], MD No   Overview Addendum 12/21/2019  1:52 PM by Gatha Mayer, MD    From [facility] record. In [place],  her mother helped her with appointments. H/o suicide attempt is listed-  and use of Haldol , trazodone , prozac listed but stopped with pregnancy. She uses Klonopin prn usually daily at bedtime       Seizure disorder during pregnancy, antepartum ([place]) 12/14/2019 by [name], MD No   Overview Addendum 12/28/2019 11:16 AM by Gatha Mayer, MD    Hx of Seizures and Pseudoseizures Pt reports these have occurred since age 29 and are responsible for her receiving SSI income  Seizures she reports as grand mal and pseudoseizures she distinguishes as being able to remember the pseudoseizures   She has been on depakote and dilantin as a child  Her last seizure was in August 2020  Does not drive  Unclear if possible related to long QT ? I did not see records of EEG , MRI       Supervision of high risk pregnancy, antepartum 12/11/2019 by Dyann Ruddle,  Konrad Felix, CMA No   Overview Addendum 12/14/2019  4:25 PM by Vena Austria, MD     Nursing Staff Provider  Office Location  [facility]  Dating   12 wk [place]  Language  English Anatomy US   complete (outside institution)  Flu Vaccine   Genetic Screen  NIPS: normal XX   TDaP vaccine    Hgb A1C or  GTT Third trimester : 6.9  Rhogam   not needed   LAB RESULTS   Feeding Plan  Blood Type B/Positive/-- (08/24 0000)   Contraception  Antibody Negative (08/24 0000)  Circumcision  Rubella Immune (08/24 0000)  Pediatrician   RPR Nonreactive (08/24 0000)   Support Person  Boyfriend HBsAg Negative (08/24 0000)   Prenatal Classes  HIV Non-reactive, Non-reactive (08/24 0000)    Varicella   BTL Consent  GBS  (For PCN allergy, check sensitivities)        VBAC Consent  no needed Pap  collected 2021    Hgb Electro   not needed    CF  negative     SMA  negative        Normal Inheritest Normal Materniti 21 XX      Type 2 diabetes mellitus affecting pregnancy in third trimester, antepartum 12/10/2019 by [name], MD No   Overview Addendum 12/21/2019  1:44 PM by Jimmey Ralph, MD    Lantus 38 units Humalog 16/16/16 Sliding scale  Growth scan 30 week (12/14/2019) 2149g or 4lbs 12oz c.w 89.1%ile AC 97.7%ile AFI 16.6cm Patient required hospitalization for diabetes control 1/21-23 Pt reports she has been on insulin for 4 years could never get control on oral agents-  hgb A1c in early pregnancy 6.2  No h/o DKA  Her mother has LADA type 1.5 diabetes - pt thinks that is the case for her as well           Gestational age appropriate obstetric precautions including but not limited to vaginal bleeding, contractions, leaking of fluid and fetal movement were reviewed in detail with the patient.    1) HTN - Mild range BP today continue present dose of clonidine - cardiology referral EKG possible left ventricular enlargement on EKG  2) GDM  - No log but reports  - HgbA1C 6.9 on 12/10/2019 giving an estimated avg BG value of 151 - Refill on Humalog is taking up to 18-20 units at time instead of the prescribed 16 units so  will run out early - IOL scheduled for 01/11/2020 8 AM [redacted]w[redacted]d  3) Dental Carries  - Has not heard from [facility]   4) Has not had contact with [facility] pregnancy case manager   Return in about 3 days (around 12/31/2019) for ROB NST & AFI 2/4 and ROB NST 2/8.  Vena Austria, MD, [facility], [facility]

## 2019-12-28 NOTE — Progress Notes (Signed)
[facility] Consultation   Chief Complaint: f/u diabetes and hypertension in pregnancy - see last week's note   HPI: Joy [name] is a 29 y.o. G2P0010 at [redacted]w[redacted]d by earliest u/s performed at [facility] center who presents in consultation from [facility]  for Diabetes and Hypertension in pregnancy   This complicated patient  returns with partner Casimiro Needle to review blood sugars and discuss pregnancy management . She has multiple medical issues and they have few resources . She receives SSI income and has Medicaid outside of pregnancy . She has accessed [facility] and her grandmother has sent some baby things - (car seat, pack and play and a few diapers , they have a bassinet  ) She does not have a pregnancy care manager to guide her toward additional resources such as peds care . Relies on others for transportation .  Diabetes-likely Type "1.5" Patient requests we send kwik pen lantus and humalog - she prefers to vials - this was done by Rn  Her pregnancy log was eaten by  the godmother's dog where she  and Michael stay . The one day she shared all were below 150 - 2 in the 130-140 range.  She is taking Lantus 38 and humalog 12-16 which she takes as a sliding scale depending on her premeal blood sugars - pt declined a hgb A1c today "drawn in hospital " - no hgb A1c found in those labs   Mental health issues  Uses Klonopin prn mostly at night for anxiety off other meds - pt feels stable . Identifying a mental health provider prior to delivery might be helpful - she has h/o insomnia and suicide attempt - she is cut off from supports ( mom is in [place]) except Michael   Hypertension  Taking her clonidine daily - BP in mild range today   Asthma - prn albuterol use daily  Smoker - cutting back Casimiro Needle smokes also  Long Qt syndrome - no EKG here - I ordered today  Body pain back pain - she reports severe back spasms uses daily baclofen - plans on declining epidural - due to worries  about her back - also notes her mother had a cardiac arrest with her epidural for her brother.   Nausea - uses bid tigan po works for her - needs refill   H/o seizures/pseudoseizures - last one in August - on  No meds  Pt says the difference is she can remember the pseudoseizures - pt is originally from [place] last year or 2 lived in [place] some history in Care everywhere        Past Medical History: Patient  has a past medical history of Anxiety, Asperger's syndrome, Diabetes mellitus without complication (HCC), Headache, Long Q-T syndrome, Mental disorder, and Seizures (HCC).  Past Surgical History: She  has a past surgical history that includes Tonsillectomy.  Obstetric History:  OB History    Gravida  2   Para      Term      Preterm      AB  1   Living        SAB  1   TAB      Ectopic      Multiple      Live Births             Gynecologic History:  Patient's last menstrual period was 04/17/2019.    Medications   Current Outpatient Medications:  .  albuterol (VENTOLIN HFA) 108 (90 Base)  MCG/ACT inhaler, Inhale 1-2 puffs into the lungs every 6 (six) hours as needed for wheezing or shortness of breath., Disp: , Rfl:  .  amoxicillin-clavulanate (AUGMENTIN) 875-125 MG tablet, Take 1 tablet by mouth 2 (two) times daily for 10 days., Disp: 20 tablet, Rfl: 0 .  baclofen (LIORESAL) 10 MG tablet, Take 10 mg by mouth 3 (three) times daily., Disp: , Rfl:  .  Cholecalciferol (VITAMIN D3) 2400 UNIT/ML LIQD, by Does not apply route. D2, Disp: , Rfl:  .  clonazePAM (KLONOPIN) 1 MG tablet, Take 1 tablet (1 mg total) by mouth 2 (two) times daily., Disp: 60 tablet, Rfl: 5 .  cloNIDine (CATAPRES) 0.3 MG tablet, Take 1 tablet (0.3 mg total) by mouth daily., Disp: 30 tablet, Rfl: 11 .  insulin glargine (LANTUS) 100 UNIT/ML injection, Inject 0.38 mLs (38 Units total) into the skin daily., Disp: 10 mL, Rfl: 11 .  insulin lispro (HUMALOG) 100 UNIT/ML injection, Inject 16 Units  into the skin 3 (three) times daily before meals., Disp: , Rfl:  .  Insulin Pen Needle (PEN NEEDLES) 31G X 6 MM MISC, 1 each by Does not apply route 4 (four) times daily., Disp: 100 each, Rfl: 11 .  metFORMIN (GLUCOPHAGE) 1000 MG tablet, Take 1,000 mg by mouth 2 (two) times daily with a meal., Disp: , Rfl:  .  Prenatal Vit-Fe Fumarate-FA (PRENATAL MULTIVITAMIN) TABS tablet, Take 1 tablet by mouth daily at 12 noon., Disp: , Rfl:  .  trimethobenzamide (TIGAN) 300 MG capsule, Take 1 capsule (300 mg total) by mouth 3 (three) times daily., Disp: 90 capsule, Rfl: 11 Allergies: Patient is allergic to liraglutide; flexeril [cyclobenzaprine]; gabapentin; latex; manganese trace metal additives [manganese]; and other.  Social History: Patient  reports that she has been smoking cigarettes. She has never used smokeless tobacco. She reports previous drug use. She reports that she does not drink alcohol.  Family History: family history includes Alzheimer's disease in her maternal grandfather; Asthma in her maternal grandmother; COPD in her mother; Cancer in her mother; Diabetes in her mother; Heart disease in her father and maternal grandmother; Multiple sclerosis in her maternal grandmother and mother; Parkinson's disease in her maternal grandmother; Supraventricular tachycardia in her brother; Thyroid cancer in her mother; Von Willebrand disease in her mother.  Review of Systems ongoing nausea uses tigan twice daily  Back pain on Baclofen  Physical Exam: LMP 04/17/2019  217lbs ( increase 2 lbs )  143/93, 139/90 Temp 98 Pulse 118 rr 18  o2 sat 98   RBS 89  BPP 8/8    Asessement: 1. Type 2 diabetes mellitus affecting pregnancy in third trimester, antepartum   2. Supervision of high risk pregnancy, antepartum   3. Smoker   4. Single umbilical artery   5. Seizure disorder during pregnancy, antepartum (HCC)   6. Pre-existing essential hypertension during pregnancy in third trimester   7. PCOS (polycystic  ovarian syndrome)   8. Paranoid personality (disorder) (HCC)   9. Class 2 severe obesity due to excess calories with serious comorbidity and body mass index (BMI) of 35.0 to 35.9 in adult (HCC)   10. Long Q-T syndrome   11. Mild intermittent asthma, unspecified whether complicated   12. Anxiety in pregnancy in third trimester, antepartum   13. Back pain affecting pregnancy in third trimester     Plan: This is a complex pt - if desired we can manage labor  at [facility] . Pt has transportation issues and would like local care if possible -  recommend induction of labor at 37 weeks  - twice weekly testing with NST AFI or BPP given DM on insulin and HTN on meds  - follow BP , continue clonidine  - no change in insulin 38 lantus , 12-16 units premeal as being given by pt  -consider mental health referral prior to delivery - consider neurology follow up after delivery for sz issue  - consider cardiology evaluation if EKG abnormal - possible LA enlargement - consider an echo - referral to Pregnancy care manager and other available support  - we gave a list of parenting classes  - I suggested she keep an open mind about labor analgesia  - pt is planning condoms - we discussed mirena as a good choice with PCOS - pt is wary of birth control as causing complications  - pt is planning on bottle feeding though today reported she might try to breast feed in the hospital where she and baby could be observed for complicaitons - clonidine, baclofen and clonazepam are not contraindicated individually ( clonazepam has the most concern ) though not clear if issues for infant  if mother using the 3 together - at [facility] the LCs will call [facility] ([phone] )  for advice regarding med combos . Not clear how her mental health and seizure issues will behave with sleep disruption associated with breast feeding. I was supportive of whatever decision she makes - she has applied and qualified for [facility] but  will need advice on bottle feeding and breast care also.   - plug in with pregnancy care manager - needs a plan for pediatrician , baby care classes information offered      Total time spent with the patient was 30 minutes with greater than 50% spent in counseling and coordination of care. We appreciate this interesting consult and will be happy to be involved in the ongoing care of Ms. Heeg in anyway her obstetricians desire.  Gatha Mayer MD  [facility]

## 2019-12-28 NOTE — Progress Notes (Signed)
ROB MFM appointment today

## 2019-12-28 NOTE — Progress Notes (Signed)
Yes I saw your recommendation and placed an urgent cardiology referral

## 2019-12-29 ENCOUNTER — Encounter: Payer: Self-pay | Admitting: Cardiovascular Disease

## 2019-12-29 ENCOUNTER — Ambulatory Visit (INDEPENDENT_AMBULATORY_CARE_PROVIDER_SITE_OTHER): Payer: Medicaid Other | Admitting: Cardiovascular Disease

## 2019-12-29 ENCOUNTER — Other Ambulatory Visit: Payer: Self-pay

## 2019-12-29 VITALS — BP 138/80 | HR 108 | Ht 65.0 in | Wt 218.5 lb

## 2019-12-29 DIAGNOSIS — R002 Palpitations: Secondary | ICD-10-CM

## 2019-12-29 DIAGNOSIS — I10 Essential (primary) hypertension: Secondary | ICD-10-CM | POA: Diagnosis not present

## 2019-12-29 DIAGNOSIS — R55 Syncope and collapse: Secondary | ICD-10-CM | POA: Diagnosis not present

## 2019-12-29 DIAGNOSIS — R0602 Shortness of breath: Secondary | ICD-10-CM

## 2019-12-29 NOTE — Progress Notes (Signed)
Cardiology Office Note   Date:  12/29/2019   ID:  [name], DOB [birth year], MRN [id number]  PCP:  System, Pcp Not In  Cardiologist:   Lorine Bears, MD   Chief Complaint  Patient presents with  . New Patient (Initial Visit)    Abnormal EKG, HTN, and IDDM; Meds verbally reviewed with patient.      History of Present Illness: Joy [name] is a 29 y.o. female who was referred by Dr. Bonney Aid for evaluation of abnormal EKG and prolonged QT. She reports being told about prolonged QT when she was 29 years old and was followed by pediatric cardiology at some point.  However, she never had documented arrhythmia associated with this.  She had issues with chronic sinus tachycardia and palpitations in the past and she does report intermittent syncope but she is not able to provide much details about the syncopal episode.  She is diagnosed with a seizure disorder but currently is not on seizure medications. In addition, she has history of hypertension for many years and reports being on different blood pressure medications that she did not tolerate.  She was given clonidine for insomnia which also worked very well for her hypertension and she continued on that medication. She has other chronic medical conditions that include diabetes mellitus currently on insulin, anxiety disorder, depression and Asperger's syndrome She is currently [redacted] weeks pregnant with plans for induction on February 13. She denies chest pain but does complain of significant dyspnea.    Past Medical History:  Diagnosis Date  . Anxiety   . Asperger's syndrome   . Diabetes mellitus without complication (HCC)   . Headache   . Long Q-T syndrome   . Mental disorder    Depression. past suicide attempt  . Seizures (HCC)     Past Surgical History:  Procedure Laterality Date  . TONSILLECTOMY       Current Outpatient Medications  Medication Sig Dispense Refill  . albuterol (VENTOLIN HFA) 108 (90 Base) MCG/ACT  inhaler Inhale 1-2 puffs into the lungs every 6 (six) hours as needed for wheezing or shortness of breath.    . baclofen (LIORESAL) 10 MG tablet Take 10 mg by mouth 3 (three) times daily.    . Cholecalciferol (VITAMIN D3) 2400 UNIT/ML LIQD by Does not apply route. D2    . clonazePAM (KLONOPIN) 1 MG tablet Take 1 tablet (1 mg total) by mouth 2 (two) times daily. 60 tablet 5  . cloNIDine (CATAPRES) 0.3 MG tablet Take 1 tablet (0.3 mg total) by mouth daily. 30 tablet 11  . insulin glargine (LANTUS) 100 UNIT/ML injection Inject 0.38 mLs (38 Units total) into the skin daily. 10 mL 11  . insulin lispro (HUMALOG) 100 UNIT/ML injection Inject 16 Units into the skin 3 (three) times daily before meals.    . insulin lispro (HUMALOG) 100 UNIT/ML KwikPen Inject 0.18 mLs (18 Units total) into the skin 3 (three) times daily before meals. 15 mL 11  . Insulin Pen Needle (PEN NEEDLES) 31G X 6 MM MISC 1 each by Does not apply route 4 (four) times daily. 100 each 11  . metFORMIN (GLUCOPHAGE) 1000 MG tablet Take 1,000 mg by mouth 2 (two) times daily with a meal.    . Prenatal Vit-Fe Fumarate-FA (PRENATAL MULTIVITAMIN) TABS tablet Take 1 tablet by mouth daily at 12 noon.    . trimethobenzamide (TIGAN) 300 MG capsule Take 1 capsule (300 mg total) by mouth 3 (three) times daily. 90 capsule 11  .  amoxicillin-clavulanate (AUGMENTIN) 875-125 MG tablet Take 1 tablet by mouth 2 (two) times daily for 10 days. (Patient not taking: Reported on 12/29/2019) 20 tablet 0   No current facility-administered medications for this visit.    Allergies:   Liraglutide, Flexeril [cyclobenzaprine], Gabapentin, Latex, Manganese trace metal additives [manganese], and Other    Social History:  The patient  reports that she has been smoking cigarettes. She has a 2.50 pack-year smoking history. She has never used smokeless tobacco. She reports previous drug use. She reports that she does not drink alcohol.   Family History:  The patient's family  history includes Alzheimer's disease in her maternal grandfather; Asthma in her maternal grandmother; COPD in her mother; Cancer in her mother; Diabetes in her mother; Heart disease in her father and maternal grandmother; Multiple sclerosis in her maternal grandmother and mother; Parkinson's disease in her maternal grandmother; Supraventricular tachycardia in her brother; Thyroid cancer in her mother; Von Willebrand disease in her mother.    ROS:  Please see the history of present illness.   Otherwise, review of systems are positive for none.   All other systems are reviewed and negative.    PHYSICAL EXAM: VS:  BP 138/80 (BP Location: Right Arm, Patient Position: Sitting, Cuff Size: Normal)   Pulse (!) 108   Ht 5\' 5"  (1.651 m)   Wt 218 lb 8 oz (99.1 kg)   LMP 04/17/2019   SpO2 97%   BMI 36.36 kg/m  , BMI Body mass index is 36.36 kg/m. GEN: Well nourished, well developed, in no acute distress  HEENT: normal  Neck: no JVD, carotid bruits, or masses Cardiac: RRR; no murmurs, rubs, or gallops,no edema  Respiratory:  clear to auscultation bilaterally, normal work of breathing GI: soft, nontender, nondistended, + BS MS: no deformity or atrophy  Skin: warm and dry, no rash Neuro:  Strength and sensation are intact Psych: euthymic mood, full affect   EKG:  EKG is ordered today. The ekg ordered today demonstrates Sinus tachycardia with borderline prolonged QT interval with QTC of 469 ms.   Recent Labs: 07/20/2019: TSH 2.37 12/17/2019: ALT 14; Hemoglobin 12.2; Platelets 341 12/19/2019: BUN 9; Creatinine, Ser 0.52; Potassium 3.9; Sodium 137    Lipid Panel No results found for: CHOL, TRIG, HDL, CHOLHDL, VLDL, LDLCALC, LDLDIRECT    Wt Readings from Last 3 Encounters:  12/29/19 218 lb 8 oz (99.1 kg)  12/28/19 217 lb (98.4 kg)  12/28/19 217 lb (98.4 kg)        PAD Screen 12/29/2019  Previous PAD dx? Yes  Previous surgical procedure? No  Pain with walking? Yes  Subsides with rest?  No  Feet/toe relief with dangling? No  Painful, non-healing ulcers? No  Extremities discolored? No      ASSESSMENT AND PLAN:  1.  Questionable history of prolonged QT syndrome.  The patient does have borderline prolonged QT interval on EKG.  However, there has been no documented arrhythmia associated with this.  She does complain of palpitations which could be due to sinus tachycardia but I am going to obtain a 1 week outpatient monitor for evaluation of this. Avoid medications that can cause QT prolongation.  2.  Exertional dyspnea in the setting of pregnancy: History of essential hypertension.  I am going to obtain an echocardiogram to evaluate LV systolic function, pulmonary pressure and valvular structure.  3.  Hypertension: Seems to be reasonably controlled with clonidine.  Additional doses of clonidine 0.1 mg can be used if needed during the  delivery time.    Disposition:   FU with me as needed if cardiac testing is abnormal. Signed,  Lorine Bears, MD  12/29/2019 2:18 PM    [facility]

## 2019-12-29 NOTE — Patient Instructions (Addendum)
Medication Instructions:  - Your physician recommends that you continue on your current medications as directed. Please refer to the Current Medication list given to you today.  *If you need a refill on your cardiac medications before your next appointment, please call your pharmacy*  Lab Work: - none ordered  If you have labs (blood work) drawn today and your tests are completely normal, you will receive your results only by: [name] MyChart Message (if you have MyChart) OR . A paper copy in the mail If you have any lab test that is abnormal or we need to change your treatment, we will call you to review the results.  Testing/Procedures: - Your physician has requested that you have an echocardiogram- Thursday 12/31/19 at 8:30 am. Echocardiography is a painless test that uses sound waves to create images of your heart. It provides your doctor with information about the size and shape of your heart and how well your heart's chambers and valves are working. This procedure takes approximately one hour. There are no restrictions for this procedure.  - Your physician has recommended that you wear a 7 day Zio monitor- to be placed Thursday 12/31/19 just after your echo is completed. This monitor is a medical device that records the heart's electrical activity. Doctors most often use these monitors to diagnose arrhythmias. Arrhythmias are problems with the speed or rhythm of the heartbeat. The monitor is a small device applied to your chest. You can wear one while you do your normal daily activities. While wearing this monitor if you have any symptoms to push the button and record what you felt. Once you have worn this monitor for the period of time provider prescribed (Usually 14 days), you will return the monitor device in the postage paid box. Once it is returned they will download the data collected and provide [place] with a report which the provider will then review and we will call you with those results. Important  tips:  1. Avoid showering during the first 24 hours of wearing the monitor. 2. Avoid excessive sweating to help maximize wear time. 3. Do not submerge the device, no hot tubs, and no swimming pools. 4. Keep any lotions or oils away from the patch. 5. After 24 hours you may shower with the patch on. Take brief showers with your back facing the shower head.  6. Do not remove patch once it has been placed because that will interrupt data and decrease adhesive wear time. 7. Push the button when you have any symptoms and write down what you were feeling. 8. Once you have completed wearing your monitor, remove and place into box which has postage paid and place in your outgoing mailbox.  9. If for some reason you have misplaced your box then call our office and we can provide another box and/or mail it off for you.        Follow-Up: At [facility], you and your health needs are our priority.  As part of our continuing mission to provide you with exceptional heart care, we have created designated Provider Care Teams.  These Care Teams include your primary Cardiologist (physician) and Advanced Practice Providers (APPs -  Physician Assistants and Nurse Practitioners) who all work together to provide you with the care you need, when you need it.  Your next appointment:   As needed   The format for your next appointment:   as needed  Provider:    You may see Kathlyn Sacramento, MD or one  of the following Advanced Practice Providers on your designated Care Team:    Nicolasa Ducking, NP  Eula Listen, PA-C  Marisue Ivan, PA-C   Other Instructions - n/a   Echocardiogram An echocardiogram is a procedure that uses painless sound waves (ultrasound) to produce an image of the heart. Images from an echocardiogram can provide important information about:  Signs of coronary artery disease (CAD).  Aneurysm detection. An aneurysm is a weak or damaged part of an artery wall that bulges out  from the normal force of blood pumping through the body.  Heart size and shape. Changes in the size or shape of the heart can be associated with certain conditions, including heart failure, aneurysm, and CAD.  Heart muscle function.  Heart valve function.  Signs of a past heart attack.  Fluid buildup around the heart.  Thickening of the heart muscle.  A tumor or infectious growth around the heart valves. Tell a health care provider about:  Any allergies you have.  All medicines you are taking, including vitamins, herbs, eye drops, creams, and over-the-counter medicines.  Any blood disorders you have.  Any surgeries you have had.  Any medical conditions you have.  Whether you are pregnant or may be pregnant. What are the risks? Generally, this is a safe procedure. However, problems may occur, including:  Allergic reaction to dye (contrast) that may be used during the procedure. What happens before the procedure? No specific preparation is needed. You may eat and drink normally. What happens during the procedure?   An IV tube may be inserted into one of your veins.  You may receive contrast through this tube. A contrast is an injection that improves the quality of the pictures from your heart.  A gel will be applied to your chest.  A wand-like tool (transducer) will be moved over your chest. The gel will help to transmit the sound waves from the transducer.  The sound waves will harmlessly bounce off of your heart to allow the heart images to be captured in real-time motion. The images will be recorded on a computer. The procedure may vary among health care providers and hospitals. What happens after the procedure?  You may return to your normal, everyday life, including diet, activities, and medicines, unless your health care provider tells you not to do that. Summary  An echocardiogram is a procedure that uses painless sound waves (ultrasound) to produce an image  of the heart.  Images from an echocardiogram can provide important information about the size and shape of your heart, heart muscle function, heart valve function, and fluid buildup around your heart.  You do not need to do anything to prepare before this procedure. You may eat and drink normally.  After the echocardiogram is completed, you may return to your normal, everyday life, unless your health care provider tells you not to do that. This information is not intended to replace advice given to you by your health care provider. Make sure you discuss any questions you have with your health care provider. Document Revised: 03/05/2019 Document Reviewed: 12/15/2016 Elsevier Patient Education  2020 [employer].

## 2019-12-31 ENCOUNTER — Encounter: Payer: Self-pay | Admitting: Obstetrics & Gynecology

## 2019-12-31 ENCOUNTER — Other Ambulatory Visit: Payer: Self-pay | Admitting: Obstetrics and Gynecology

## 2019-12-31 ENCOUNTER — Ambulatory Visit (INDEPENDENT_AMBULATORY_CARE_PROVIDER_SITE_OTHER): Payer: Medicaid Other | Admitting: Obstetrics & Gynecology

## 2019-12-31 ENCOUNTER — Other Ambulatory Visit: Payer: Self-pay | Admitting: Obstetrics & Gynecology

## 2019-12-31 ENCOUNTER — Other Ambulatory Visit: Payer: Self-pay

## 2019-12-31 ENCOUNTER — Other Ambulatory Visit: Payer: Medicaid Other

## 2019-12-31 ENCOUNTER — Ambulatory Visit (INDEPENDENT_AMBULATORY_CARE_PROVIDER_SITE_OTHER): Payer: Medicaid Other

## 2019-12-31 VITALS — BP 122/80 | Wt 220.0 lb

## 2019-12-31 DIAGNOSIS — Z3A35 35 weeks gestation of pregnancy: Secondary | ICD-10-CM

## 2019-12-31 DIAGNOSIS — O322XX0 Maternal care for transverse and oblique lie, not applicable or unspecified: Secondary | ICD-10-CM | POA: Diagnosis not present

## 2019-12-31 DIAGNOSIS — O0990 Supervision of high risk pregnancy, unspecified, unspecified trimester: Secondary | ICD-10-CM

## 2019-12-31 DIAGNOSIS — O10013 Pre-existing essential hypertension complicating pregnancy, third trimester: Secondary | ICD-10-CM | POA: Diagnosis not present

## 2019-12-31 DIAGNOSIS — Z3689 Encounter for other specified antenatal screening: Secondary | ICD-10-CM

## 2019-12-31 DIAGNOSIS — O24113 Pre-existing diabetes mellitus, type 2, in pregnancy, third trimester: Secondary | ICD-10-CM

## 2019-12-31 DIAGNOSIS — O99810 Abnormal glucose complicating pregnancy: Secondary | ICD-10-CM | POA: Diagnosis not present

## 2019-12-31 DIAGNOSIS — O0993 Supervision of high risk pregnancy, unspecified, third trimester: Secondary | ICD-10-CM

## 2019-12-31 LAB — POCT URINALYSIS DIPSTICK OB
Glucose, UA: NEGATIVE
POC,PROTEIN,UA: NEGATIVE

## 2019-12-31 LAB — FETAL NONSTRESS TEST

## 2019-12-31 NOTE — Progress Notes (Signed)
Prenatal Visit Note Date: 12/31/2019 Clinic: [facility]  Subjective:  Joy [name] is a 29 y.o. G2P0010 at [redacted]w[redacted]d being seen today for ongoing prenatal care.  She is currently monitored for the following issues for this high-risk pregnancy and has Type 2 diabetes mellitus affecting pregnancy in third trimester, antepartum; Supervision of high risk pregnancy, antepartum; Pre-existing essential hypertension during pregnancy in third trimester; Anxiety in pregnancy in third trimester, antepartum; Seizure disorder during pregnancy, antepartum (HCC); Hyperlipidemia; Paranoid personality (disorder) (HCC); Asperger's syndrome; Tooth decay; Hyperglycemia in pregnancy; Obesity; Asthma; Single umbilical artery; Smoker; PCOS (polycystic ovarian syndrome); Long Q-T syndrome; and Back pain affecting pregnancy on their problem list.  Patient reports 1. BS mostly WNL on recent change to Insulin regimen (saw [facility] 3 days ago).   2.  No s/sx preeclampsia 3.  No s/sx PTL 4.  Dental disease and infection that she was treated for 2 weeks ago is better now.  Still has not had opportunity to see dentist 5.  Contractions: Not present. Vag. Bleeding: None.  Movement: Present. Denies leaking of fluid.   The following portions of the patient's history were reviewed and updated as appropriate: allergies, current medications, past family history, past medical history, past social history, past surgical history and problem list. Problem list updated.  Objective:   Vitals:   12/31/19 1526  BP: 122/80  Weight: 220 lb (99.8 kg)    Fetal Status:     Movement: Present      A NST procedure was performed with FHR monitoring and a normal baseline established, appropriate time of 20-40 minutes of evaluation, and accels >2 seen w 15x15 characteristics.  Results show a REACTIVE NST.   Review of ULTRASOUND.    I have personally reviewed images and report of recent ultrasound done at [facility].    Plan of management to be  discussed with patient.    Transverse lie    AFI normal  General:  Alert, oriented and cooperative. Patient is in no acute distress.  Skin: Skin is warm and dry. No rash noted.   Cardiovascular: Normal heart rate noted  Respiratory: Normal respiratory effort, no problems with respiration noted  Abdomen: Soft, gravid, appropriate for gestational age. Pain/Pressure: Present     Pelvic:  Cervical exam deferred        Extremities: Normal range of motion.     Mental Status: Normal mood and affect. Normal behavior. Normal judgment and thought content.   Urinalysis:      Assessment and Plan:  Pregnancy: G2P0010 at [redacted]w[redacted]d  1. [redacted] weeks gestation of pregnancy - POC Urinalysis Dipstick OB - PNV - [facility] - PTL precautions  2. Hyperglycemia in pregnancy - Cont current Insulin regimen - NST biweekly, AFI weekly - IOL 37 weeks  3. Pre-existing essential hypertension during pregnancy in third trimester - Clonidine - No s/sx preeclampsia - Normal BP today  4. Supervision of high risk pregnancy, antepartum - [name], case manager, in to see pt today to discuss several areas of need  Preterm labor symptoms and general obstetric precautions including but not limited to vaginal bleeding, contractions, leaking of fluid and fetal movement were reviewed in detail with the patient. Please refer to After Visit Summary for other counseling recommendations.    Supervision of high risk pregnancy, antepartum 12/11/2019 by [name], CMA No   Overview Addendum 12/14/2019  4:25 PM by Vena Austria, MD     Nursing Staff Provider  Office Location  [facility] Dating   12 wk [place]  Language  Media planner [place]   complete (outside institution)  Flu Vaccine  declines Genetic Screen  NIPS: normal XX   TDaP vaccine   declines Hgb A1C or  GTT Third trimester : 6.9  Rhogam   not needed   LAB RESULTS   Feeding Plan  breast Blood Type B/Positive/-- (08/24 0000)   Contraception  condoms, counseled as to IUD  Antibody Negative (08/24 0000)  Circumcision  unknown Rubella Immune (08/24 0000)  Pediatrician   unknown RPR Nonreactive (08/24 0000)   Support Person  Boyfriend HBsAg Negative (08/24 0000)   Prenatal Classes no HIV Non-reactive, Non-reactive (08/24 0000)    Varicella   BTL Consent n/a GBS  (For PCN allergy, check sensitivities)        VBAC Consent  no needed Pap  collected 2021    Hgb Electro   not needed    CF  negative     SMA  negative        Normal Inheritest Normal Materniti 21 XX                     Return in 1 week (on 01/07/2020) for HROB w [place] AFI and NST next Thurs.  Barnett Applebaum, MD, [facility], [facility] 12/31/2019  3:58 PM

## 2020-01-01 ENCOUNTER — Other Ambulatory Visit: Payer: Self-pay | Admitting: Obstetrics and Gynecology

## 2020-01-01 ENCOUNTER — Telehealth: Payer: Self-pay | Admitting: Cardiovascular Disease

## 2020-01-01 ENCOUNTER — Other Ambulatory Visit: Payer: Medicaid Other

## 2020-01-01 NOTE — Telephone Encounter (Signed)
Patient called this morning to reschedule echocardiogram and zio monitor appointment. Patient has now been rescheduled for 2/8 at 8 am at the hospital and will come to have monitor placed afterwards.   Dr.Arida will need to e-sign the echo order again now that it has been rescheduled at the hospital

## 2020-01-01 NOTE — Telephone Encounter (Signed)
Advise

## 2020-01-01 NOTE — Telephone Encounter (Signed)
Noted. Msg sent to Dr. Kirke Corin to re-sign the echo order

## 2020-01-04 ENCOUNTER — Ambulatory Visit
Admission: RE | Admit: 2020-01-04 | Discharge: 2020-01-04 | Disposition: A | Discharge status: Home / Self Care | Payer: Medicaid Other | Source: Ambulatory Visit | Attending: Obstetrics and Gynecology | Admitting: Obstetrics and Gynecology

## 2020-01-04 ENCOUNTER — Ambulatory Visit (INDEPENDENT_AMBULATORY_CARE_PROVIDER_SITE_OTHER): Payer: Medicaid Other | Admitting: Obstetrics and Gynecology

## 2020-01-04 ENCOUNTER — Ambulatory Visit (HOSPITAL_BASED_OUTPATIENT_CLINIC_OR_DEPARTMENT_OTHER)
Admission: RE | Admit: 2020-01-04 | Discharge: 2020-01-04 | Disposition: A | Discharge status: Home / Self Care | Payer: Medicaid Other | Source: Ambulatory Visit | Attending: Obstetrics and Gynecology | Admitting: Obstetrics and Gynecology

## 2020-01-04 ENCOUNTER — Ambulatory Visit (HOSPITAL_BASED_OUTPATIENT_CLINIC_OR_DEPARTMENT_OTHER)
Admission: RE | Admit: 2020-01-04 | Discharge: 2020-01-04 | Disposition: A | Discharge status: Home / Self Care | Payer: Medicaid Other | Source: Ambulatory Visit | Attending: Cardiovascular Disease | Admitting: Cardiovascular Disease

## 2020-01-04 ENCOUNTER — Ambulatory Visit (INDEPENDENT_AMBULATORY_CARE_PROVIDER_SITE_OTHER): Payer: Medicaid Other

## 2020-01-04 ENCOUNTER — Other Ambulatory Visit: Payer: Self-pay

## 2020-01-04 VITALS — BP 136/80 | Wt 220.0 lb

## 2020-01-04 DIAGNOSIS — O99350 Diseases of the nervous system complicating pregnancy, unspecified trimester: Secondary | ICD-10-CM

## 2020-01-04 DIAGNOSIS — M62830 Muscle spasm of back: Secondary | ICD-10-CM | POA: Insufficient documentation

## 2020-01-04 DIAGNOSIS — F419 Anxiety disorder, unspecified: Secondary | ICD-10-CM

## 2020-01-04 DIAGNOSIS — E669 Obesity, unspecified: Secondary | ICD-10-CM | POA: Insufficient documentation

## 2020-01-04 DIAGNOSIS — R002 Palpitations: Secondary | ICD-10-CM | POA: Diagnosis not present

## 2020-01-04 DIAGNOSIS — Z794 Long term (current) use of insulin: Secondary | ICD-10-CM | POA: Diagnosis not present

## 2020-01-04 DIAGNOSIS — O26893 Other specified pregnancy related conditions, third trimester: Secondary | ICD-10-CM | POA: Diagnosis not present

## 2020-01-04 DIAGNOSIS — Q270 Congenital absence and hypoplasia of umbilical artery: Secondary | ICD-10-CM | POA: Diagnosis not present

## 2020-01-04 DIAGNOSIS — O99343 Other mental disorders complicating pregnancy, third trimester: Secondary | ICD-10-CM

## 2020-01-04 DIAGNOSIS — F17200 Nicotine dependence, unspecified, uncomplicated: Secondary | ICD-10-CM

## 2020-01-04 DIAGNOSIS — O99213 Obesity complicating pregnancy, third trimester: Secondary | ICD-10-CM | POA: Insufficient documentation

## 2020-01-04 DIAGNOSIS — O0990 Supervision of high risk pregnancy, unspecified, unspecified trimester: Secondary | ICD-10-CM

## 2020-01-04 DIAGNOSIS — G40909 Epilepsy, unspecified, not intractable, without status epilepticus: Secondary | ICD-10-CM | POA: Insufficient documentation

## 2020-01-04 DIAGNOSIS — Z79899 Other long term (current) drug therapy: Secondary | ICD-10-CM | POA: Diagnosis not present

## 2020-01-04 DIAGNOSIS — O99333 Smoking (tobacco) complicating pregnancy, third trimester: Secondary | ICD-10-CM | POA: Insufficient documentation

## 2020-01-04 DIAGNOSIS — E119 Type 2 diabetes mellitus without complications: Secondary | ICD-10-CM | POA: Insufficient documentation

## 2020-01-04 DIAGNOSIS — G4700 Insomnia, unspecified: Secondary | ICD-10-CM | POA: Insufficient documentation

## 2020-01-04 DIAGNOSIS — F4310 Post-traumatic stress disorder, unspecified: Secondary | ICD-10-CM | POA: Insufficient documentation

## 2020-01-04 DIAGNOSIS — M549 Dorsalgia, unspecified: Secondary | ICD-10-CM | POA: Insufficient documentation

## 2020-01-04 DIAGNOSIS — O24113 Pre-existing diabetes mellitus, type 2, in pregnancy, third trimester: Secondary | ICD-10-CM

## 2020-01-04 DIAGNOSIS — F17210 Nicotine dependence, cigarettes, uncomplicated: Secondary | ICD-10-CM | POA: Diagnosis not present

## 2020-01-04 DIAGNOSIS — O10013 Pre-existing essential hypertension complicating pregnancy, third trimester: Secondary | ICD-10-CM | POA: Insufficient documentation

## 2020-01-04 DIAGNOSIS — O99353 Diseases of the nervous system complicating pregnancy, third trimester: Secondary | ICD-10-CM

## 2020-01-04 DIAGNOSIS — F845 Asperger's syndrome: Secondary | ICD-10-CM | POA: Diagnosis not present

## 2020-01-04 DIAGNOSIS — R0602 Shortness of breath: Secondary | ICD-10-CM

## 2020-01-04 DIAGNOSIS — O99513 Diseases of the respiratory system complicating pregnancy, third trimester: Secondary | ICD-10-CM | POA: Diagnosis not present

## 2020-01-04 DIAGNOSIS — E282 Polycystic ovarian syndrome: Secondary | ICD-10-CM

## 2020-01-04 DIAGNOSIS — R110 Nausea: Secondary | ICD-10-CM | POA: Insufficient documentation

## 2020-01-04 DIAGNOSIS — J45909 Unspecified asthma, uncomplicated: Secondary | ICD-10-CM | POA: Diagnosis not present

## 2020-01-04 DIAGNOSIS — Z3685 Encounter for antenatal screening for Streptococcus B: Secondary | ICD-10-CM

## 2020-01-04 DIAGNOSIS — Z3A36 36 weeks gestation of pregnancy: Secondary | ICD-10-CM | POA: Insufficient documentation

## 2020-01-04 DIAGNOSIS — O09893 Supervision of other high risk pregnancies, third trimester: Secondary | ICD-10-CM

## 2020-01-04 LAB — POCT URINALYSIS DIPSTICK OB
Glucose, UA: NEGATIVE
POC,PROTEIN,UA: NEGATIVE

## 2020-01-04 LAB — FETAL NONSTRESS TEST

## 2020-01-04 LAB — GLUCOSE, CAPILLARY: Glucose-Capillary: 129 mg/dL — ABNORMAL HIGH (ref 70–99)

## 2020-01-04 NOTE — Progress Notes (Signed)
Routine Prenatal Care Visit  Subjective  Joy [name] is a 29 y.o. G2P0010 at [redacted]w[redacted]d being seen today for ongoing prenatal care.  She is currently monitored for the following issues for this low-risk pregnancy and has Type 2 diabetes mellitus affecting pregnancy in third trimester, antepartum; Supervision of high risk pregnancy, antepartum; Pre-existing essential hypertension during pregnancy in third trimester; Anxiety in pregnancy in third trimester, antepartum; Seizure disorder during pregnancy, antepartum (HCC); Hyperlipidemia; Paranoid personality (disorder) (HCC); Asperger's syndrome; Tooth decay; Hyperglycemia in pregnancy; Obesity; Asthma; Single umbilical artery; Smoker; PCOS (polycystic ovarian syndrome); Long Q-T syndrome; and Back pain affecting pregnancy on their problem list.  ----------------------------------------------------------------------------------- Patient reports no complaints.   Contractions: Not present. Vag. Bleeding: None.  Movement: Present. Denies leaking of fluid.  ----------------------------------------------------------------------------------- The following portions of the patient's history were reviewed and updated as appropriate: allergies, current medications, past family history, past medical history, past social history, past surgical history and problem list. Problem list updated.   Objective  Blood pressure 136/80, weight 220 lb (99.8 kg), last menstrual period 04/17/2019. Pregravid weight 195 lb (88.5 kg) Total Weight Gain 25 lb (11.3 kg) Urinalysis:      Fetal Status: Fetal Heart Rate (bpm): 145   Movement: Present     General:  Alert, oriented and cooperative. Patient is in no acute distress.  Skin: Skin is warm and dry. No rash noted.   Cardiovascular: Normal heart rate noted  Respiratory: Normal respiratory effort, no problems with respiration noted  Abdomen: Soft, gravid, appropriate for gestational age. Pain/Pressure: Present       Pelvic:  Cervical exam deferred        Extremities: Normal range of motion.     ental Status: Normal mood and affect. Normal behavior. Normal judgment and thought content.   Baseline:145 Variability: moderate Accelerations: present Decelerations: absent Tocometry: N/A The patient was monitored for 30 minutes, fetal heart rate tracing was deemed reactive, category I tracing,  CPT [phone]   Assessment   29 y.o. G2P0010 at [redacted]w[redacted]d by  02/01/2020, by Ultrasound presenting for routine prenatal visit  Plan   pregnancy Problems (from 12/09/19 to present)    Problem Noted Resolved   Pre-existing essential hypertension during pregnancy in third trimester 12/14/2019 by [name], MD No   Overview Addendum 12/21/2019  1:49 PM by Jimmey Ralph, MD    Taking clonidine 0.3mg  daily Had been on propanolol in the past  Pt reports difficulty with many BP agents due to hypotension      Anxiety in pregnancy in third trimester, antepartum 12/14/2019 by [name], MD No   Overview Addendum 12/21/2019  1:52 PM by Jimmey Ralph, MD    From [facility] record. In [place],  her mother helped her with appointments. H/o suicide attempt is listed-  and use of Haldol , trazodone , prozac listed but stopped with pregnancy. 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 I did not see records of EEG , MRI       Supervision of  high risk pregnancy, antepartum 12/11/2019 by Raechel Chute, CMA No   Overview Addendum 12/14/2019  4:25 PM by [name], MD     Nursing Staff Provider  Office Location  [facility] Dating   12 wk [place]  Language  English Anatomy US   complete (outside institution)  Flu Vaccine   Genetic Screen  NIPS: normal XX   TDaP vaccine    Hgb A1C or  GTT Third trimester : 6.9  Rhogam   not needed   LAB RESULTS   Feeding Plan  Blood Type B/Positive/-- (08/24 0000)   Contraception  Antibody Negative (08/24 0000)  Circumcision  Rubella Immune (08/24 0000)  Pediatrician   RPR Nonreactive (08/24 0000)   Support Person  Boyfriend HBsAg Negative (08/24 0000)   Prenatal Classes  HIV Non-reactive, Non-reactive (08/24 0000)    Varicella   BTL Consent  GBS  (For PCN allergy, check sensitivities)        VBAC Consent  no needed Pap  collected 2021    Hgb Electro   not needed    CF  negative     SMA  negative        Normal Inheritest Normal Materniti 21 XX      Type 2 diabetes mellitus affecting pregnancy in third trimester, antepartum 12/10/2019 by [name], MD No   Overview Addendum 12/21/2019  1:44 PM by Gatha Mayer, MD    Lantus 38 units Humalog 16/16/16 Sliding scale  Growth scan 30 week (12/14/2019) 2149g or 4lbs 12oz c.w 89.1%ile AC 97.7%ile AFI 16.6cm Patient required hospitalization for diabetes control 1/21-23 Pt reports she has been on insulin for 4 years could never get control on oral agents-  hgb A1c in early pregnancy 6.2  No h/o DKA  Her mother has LADA type 1.5 diabetes - pt thinks that is the case for her as well           Gestational age appropriate obstetric precautions including but not limited to vaginal bleeding, contractions, leaking of fluid and fetal movement were reviewed in detail with the patient.    - reactive NST - no log today - Had holter and echo today   Return in about 31 days (around 02/04/2020) for ROB, NST, AFI.  [name], MD, [facility], [facility] 01/04/2020, 11:06 AM

## 2020-01-04 NOTE — Progress Notes (Signed)
*  PRELIMINARY RESULTS* Echocardiogram 2D Echocardiogram has been performed.  Cristela Blue 01/04/2020, 8:43 AM

## 2020-01-04 NOTE — Progress Notes (Signed)
ROB NST today GBS
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
## 2020-01-06 LAB — STREP GP B NAA: Strep Gp B NAA: NEGATIVE

## 2020-01-07 ENCOUNTER — Ambulatory Visit (INDEPENDENT_AMBULATORY_CARE_PROVIDER_SITE_OTHER): Payer: Medicaid Other

## 2020-01-07 ENCOUNTER — Ambulatory Visit (INDEPENDENT_AMBULATORY_CARE_PROVIDER_SITE_OTHER): Payer: Medicaid Other | Admitting: Certified Nurse Midwife

## 2020-01-07 ENCOUNTER — Other Ambulatory Visit: Payer: Self-pay

## 2020-01-07 VITALS — BP 134/80 | Temp 96.5°F | Wt 222.0 lb

## 2020-01-07 DIAGNOSIS — Z3A36 36 weeks gestation of pregnancy: Secondary | ICD-10-CM

## 2020-01-07 DIAGNOSIS — O0990 Supervision of high risk pregnancy, unspecified, unspecified trimester: Secondary | ICD-10-CM

## 2020-01-07 DIAGNOSIS — O10013 Pre-existing essential hypertension complicating pregnancy, third trimester: Secondary | ICD-10-CM

## 2020-01-07 DIAGNOSIS — O24113 Pre-existing diabetes mellitus, type 2, in pregnancy, third trimester: Secondary | ICD-10-CM

## 2020-01-07 DIAGNOSIS — Q270 Congenital absence and hypoplasia of umbilical artery: Secondary | ICD-10-CM

## 2020-01-07 DIAGNOSIS — F845 Asperger's syndrome: Secondary | ICD-10-CM

## 2020-01-07 DIAGNOSIS — O0993 Supervision of high risk pregnancy, unspecified, third trimester: Secondary | ICD-10-CM

## 2020-01-07 DIAGNOSIS — Z3689 Encounter for other specified antenatal screening: Secondary | ICD-10-CM

## 2020-01-07 LAB — POCT URINALYSIS DIPSTICK OB

## 2020-01-07 LAB — FETAL NONSTRESS TEST

## 2020-01-07 NOTE — Progress Notes (Signed)
C/o needs refill of baclofen; pain getting out of bed;

## 2020-01-08 NOTE — Progress Notes (Signed)
HROB at 36wk3d- T2DM on insulin, CHTN (on clonidine), h/o anxiety, Asperger's, obesity, lower back pain, seizure disorder, single umbilical artery Reports fasting blood sugars in the 90s to low 100s on Lantus 38 U qhs and Humalog 16/16/16/ ( varies between 14 and 18U depending on ac blood sugars and diet. On 2/8 EFW 5#12oz (34%). Today AFI 21.5cm/ cephalic presentation NST: baseline 140 with accelerations to 160s to 170, moderate variability  IOL scheduled for 01/11/2020. Can not get transportation to [facility] for Covid testing tomorrow AM. Will need to do rapid test on admission Discussed methods used for IOL [place] precautions Had long discussion with care manager  Farrel Conners, CNM

## 2020-01-11 ENCOUNTER — Encounter: Payer: Self-pay | Admitting: Obstetrics & Gynecology

## 2020-01-11 ENCOUNTER — Inpatient Hospital Stay
Admission: EM | Admit: 2020-01-11 | Discharge: 2020-01-16 | DRG: 786 | Disposition: A | Discharge status: Home / Self Care | Payer: Medicaid Other | Attending: Obstetrics & Gynecology | Admitting: Obstetrics & Gynecology

## 2020-01-11 ENCOUNTER — Other Ambulatory Visit: Payer: Self-pay

## 2020-01-11 DIAGNOSIS — O99354 Diseases of the nervous system complicating childbirth: Secondary | ICD-10-CM | POA: Diagnosis present

## 2020-01-11 DIAGNOSIS — F17210 Nicotine dependence, cigarettes, uncomplicated: Secondary | ICD-10-CM | POA: Diagnosis present

## 2020-01-11 DIAGNOSIS — R0989 Other specified symptoms and signs involving the circulatory and respiratory systems: Secondary | ICD-10-CM

## 2020-01-11 DIAGNOSIS — E669 Obesity, unspecified: Secondary | ICD-10-CM | POA: Diagnosis present

## 2020-01-11 DIAGNOSIS — O114 Pre-existing hypertension with pre-eclampsia, complicating childbirth: Principal | ICD-10-CM | POA: Diagnosis present

## 2020-01-11 DIAGNOSIS — F419 Anxiety disorder, unspecified: Secondary | ICD-10-CM | POA: Diagnosis present

## 2020-01-11 DIAGNOSIS — U071 COVID-19: Secondary | ICD-10-CM | POA: Diagnosis present

## 2020-01-11 DIAGNOSIS — O2412 Pre-existing diabetes mellitus, type 2, in childbirth: Secondary | ICD-10-CM | POA: Diagnosis present

## 2020-01-11 DIAGNOSIS — Z3490 Encounter for supervision of normal pregnancy, unspecified, unspecified trimester: Secondary | ICD-10-CM | POA: Diagnosis present

## 2020-01-11 DIAGNOSIS — O9852 Other viral diseases complicating childbirth: Secondary | ICD-10-CM | POA: Diagnosis present

## 2020-01-11 DIAGNOSIS — I4581 Long QT syndrome: Secondary | ICD-10-CM | POA: Diagnosis present

## 2020-01-11 DIAGNOSIS — G40909 Epilepsy, unspecified, not intractable, without status epilepticus: Secondary | ICD-10-CM

## 2020-01-11 DIAGNOSIS — F845 Asperger's syndrome: Secondary | ICD-10-CM | POA: Diagnosis present

## 2020-01-11 DIAGNOSIS — O99334 Smoking (tobacco) complicating childbirth: Secondary | ICD-10-CM | POA: Diagnosis present

## 2020-01-11 DIAGNOSIS — Z98891 History of uterine scar from previous surgery: Secondary | ICD-10-CM

## 2020-01-11 DIAGNOSIS — E119 Type 2 diabetes mellitus without complications: Secondary | ICD-10-CM | POA: Diagnosis present

## 2020-01-11 DIAGNOSIS — O99344 Other mental disorders complicating childbirth: Secondary | ICD-10-CM | POA: Diagnosis present

## 2020-01-11 DIAGNOSIS — O99343 Other mental disorders complicating pregnancy, third trimester: Secondary | ICD-10-CM

## 2020-01-11 DIAGNOSIS — O10013 Pre-existing essential hypertension complicating pregnancy, third trimester: Secondary | ICD-10-CM

## 2020-01-11 DIAGNOSIS — D62 Acute posthemorrhagic anemia: Secondary | ICD-10-CM | POA: Diagnosis not present

## 2020-01-11 DIAGNOSIS — O99214 Obesity complicating childbirth: Secondary | ICD-10-CM | POA: Diagnosis present

## 2020-01-11 DIAGNOSIS — O99892 Other specified diseases and conditions complicating childbirth: Secondary | ICD-10-CM | POA: Diagnosis not present

## 2020-01-11 DIAGNOSIS — Z3A37 37 weeks gestation of pregnancy: Secondary | ICD-10-CM

## 2020-01-11 DIAGNOSIS — O99350 Diseases of the nervous system complicating pregnancy, unspecified trimester: Secondary | ICD-10-CM

## 2020-01-11 DIAGNOSIS — Z794 Long term (current) use of insulin: Secondary | ICD-10-CM | POA: Diagnosis not present

## 2020-01-11 DIAGNOSIS — E1169 Type 2 diabetes mellitus with other specified complication: Secondary | ICD-10-CM | POA: Diagnosis not present

## 2020-01-11 DIAGNOSIS — O0990 Supervision of high risk pregnancy, unspecified, unspecified trimester: Secondary | ICD-10-CM

## 2020-01-11 DIAGNOSIS — O9081 Anemia of the puerperium: Secondary | ICD-10-CM | POA: Diagnosis not present

## 2020-01-11 DIAGNOSIS — O1002 Pre-existing essential hypertension complicating childbirth: Secondary | ICD-10-CM | POA: Diagnosis present

## 2020-01-11 DIAGNOSIS — O24113 Pre-existing diabetes mellitus, type 2, in pregnancy, third trimester: Secondary | ICD-10-CM

## 2020-01-11 LAB — CBC
HCT: 37.8 % (ref 36.0–46.0)
Hemoglobin: 12.6 g/dL (ref 12.0–15.0)
MCH: 27.9 pg (ref 26.0–34.0)
MCHC: 33.3 g/dL (ref 30.0–36.0)
MCV: 83.6 fL (ref 80.0–100.0)
Platelets: 312 10*3/uL (ref 150–400)
RBC: 4.52 MIL/uL (ref 3.87–5.11)
RDW: 15.5 % (ref 11.5–15.5)
WBC: 13.9 10*3/uL — ABNORMAL HIGH (ref 4.0–10.5)
nRBC: 0 % (ref 0.0–0.2)

## 2020-01-11 LAB — TYPE AND SCREEN
ABO/RH(D): B POS
Antibody Screen: NEGATIVE

## 2020-01-11 LAB — PROTEIN / CREATININE RATIO, URINE
Creatinine, Urine: 99 mg/dL
Protein Creatinine Ratio: 0.32 mg/mg{Cre} — ABNORMAL HIGH (ref 0.00–0.15)
Total Protein, Urine: 32 mg/dL

## 2020-01-11 LAB — GLUCOSE, CAPILLARY
Glucose-Capillary: 193 mg/dL — ABNORMAL HIGH (ref 70–99)
Glucose-Capillary: 126 mg/dL — ABNORMAL HIGH (ref 70–99)
Glucose-Capillary: 117 mg/dL — ABNORMAL HIGH (ref 70–99)
Glucose-Capillary: 118 mg/dL — ABNORMAL HIGH (ref 70–99)

## 2020-01-11 LAB — COMPREHENSIVE METABOLIC PANEL
ALT: 19 U/L (ref 0–44)
AST: 18 U/L (ref 15–41)
Albumin: 2.7 g/dL — ABNORMAL LOW (ref 3.5–5.0)
Alkaline Phosphatase: 95 U/L (ref 38–126)
Anion gap: 10 (ref 5–15)
BUN: 9 mg/dL (ref 6–20)
CO2: 19 mmol/L — ABNORMAL LOW (ref 22–32)
Calcium: 9 mg/dL (ref 8.9–10.3)
Chloride: 105 mmol/L (ref 98–111)
Creatinine, Ser: 0.55 mg/dL (ref 0.44–1.00)
GFR calc Af Amer: >60 mL/min (ref >60)
GFR calc non Af Amer: >60 mL/min (ref >60)
Glucose, Bld: 149 mg/dL — ABNORMAL HIGH (ref 70–99)
Potassium: 4 mmol/L (ref 3.5–5.1)
Sodium: 134 mmol/L — ABNORMAL LOW (ref 135–145)
Total Bilirubin: 0.3 mg/dL (ref 0.3–1.2)
Total Protein: 6.2 g/dL — ABNORMAL LOW (ref 6.5–8.1)

## 2020-01-11 LAB — RESPIRATORY PANEL BY RT PCR (FLU A&B, COVID)
Influenza A by PCR: NEGATIVE
Influenza B by PCR: NEGATIVE
SARS Coronavirus 2 by RT PCR: POSITIVE — AB

## 2020-01-11 MED ORDER — TRIMETHOBENZAMIDE HCL 100 MG/ML IM SOLN
200.0000 mg | Freq: Four times a day (QID) | INTRAMUSCULAR | Status: DC | PRN
  Administered 2020-01-11: 200 mg via INTRAMUSCULAR
  Filled 2020-01-11 (×2): qty 2

## 2020-01-11 MED ORDER — CLONIDINE HCL 0.1 MG PO TABS
0.3000 mg | ORAL_TABLET | Freq: Every day | ORAL | Status: DC
  Administered 2020-01-11: 0.3 mg via ORAL
  Filled 2020-01-11: qty 3

## 2020-01-11 MED ORDER — MAGNESIUM SULFATE BOLUS VIA INFUSION
4.0000 g | Freq: Once | INTRAVENOUS | Status: AC
  Administered 2020-01-11: 4 g via INTRAVENOUS
  Filled 2020-01-11: qty 1000

## 2020-01-11 MED ORDER — MAGNESIUM SULFATE 40 GM/1000ML IV SOLN
INTRAVENOUS | Status: AC
  Filled 2020-01-11: qty 1000

## 2020-01-11 MED ORDER — LIDOCAINE HCL (PF) 1 % IJ SOLN
30.0000 mL | INTRAMUSCULAR | Status: AC | PRN
  Administered 2020-01-12 (×2): 3 mL via SUBCUTANEOUS

## 2020-01-11 MED ORDER — PROCHLORPERAZINE MALEATE 5 MG PO TABS
5.0000 mg | ORAL_TABLET | Freq: Four times a day (QID) | ORAL | Status: DC | PRN
  Filled 2020-01-11: qty 1

## 2020-01-11 MED ORDER — INSULIN ASPART 100 UNIT/ML ~~LOC~~ SOLN
0.0000 [IU] | Freq: Three times a day (TID) | SUBCUTANEOUS | Status: DC
  Administered 2020-01-12 (×2): 2 [IU] via SUBCUTANEOUS
  Filled 2020-01-11 (×3): qty 1

## 2020-01-11 MED ORDER — AMMONIA AROMATIC IN INHA
RESPIRATORY_TRACT | Status: AC
  Filled 2020-01-11: qty 10

## 2020-01-11 MED ORDER — OXYTOCIN 10 UNIT/ML IJ SOLN
INTRAMUSCULAR | Status: AC
  Filled 2020-01-11: qty 2

## 2020-01-11 MED ORDER — LABETALOL HCL 5 MG/ML IV SOLN: 80.0000 mg | INTRAVENOUS | Status: DC | PRN

## 2020-01-11 MED ORDER — ACETAMINOPHEN 500 MG PO TABS
ORAL_TABLET | ORAL | Status: AC
  Filled 2020-01-11: qty 2

## 2020-01-11 MED ORDER — MISOPROSTOL 25 MCG QUARTER TABLET
ORAL_TABLET | ORAL | Status: AC
  Filled 2020-01-11: qty 1

## 2020-01-11 MED ORDER — BACLOFEN 10 MG PO TABS
10.0000 mg | ORAL_TABLET | Freq: Two times a day (BID) | ORAL | Status: DC | PRN
  Administered 2020-01-11 – 2020-01-12 (×3): 10 mg via ORAL
  Filled 2020-01-11 (×4): qty 1

## 2020-01-11 MED ORDER — INSULIN ASPART 100 UNIT/ML ~~LOC~~ SOLN
12.0000 [IU] | Freq: Three times a day (TID) | SUBCUTANEOUS | Status: DC
  Administered 2020-01-11 – 2020-01-12 (×4): 12 [IU] via SUBCUTANEOUS
  Filled 2020-01-11 (×4): qty 1

## 2020-01-11 MED ORDER — LIDOCAINE HCL (PF) 1 % IJ SOLN
INTRAMUSCULAR | Status: AC
  Filled 2020-01-11: qty 30

## 2020-01-11 MED ORDER — INSULIN GLARGINE 100 UNIT/ML ~~LOC~~ SOLN
38.0000 [IU] | Freq: Every day | SUBCUTANEOUS | Status: DC
  Administered 2020-01-11: 38 [IU] via SUBCUTANEOUS
  Filled 2020-01-11 (×2): qty 0.38

## 2020-01-11 MED ORDER — CALCIUM GLUCONATE 10 % IV SOLN
INTRAVENOUS | Status: AC
  Filled 2020-01-11: qty 10

## 2020-01-11 MED ORDER — LABETALOL HCL 5 MG/ML IV SOLN: 40.0000 mg | INTRAVENOUS | Status: DC | PRN

## 2020-01-11 MED ORDER — MAGNESIUM SULFATE 40 GM/1000ML IV SOLN
2.0000 g/h | INTRAVENOUS | Status: DC
  Administered 2020-01-11 – 2020-01-13 (×3): 2 g/h via INTRAVENOUS
  Filled 2020-01-11 (×2): qty 1000

## 2020-01-11 MED ORDER — TERBUTALINE SULFATE 1 MG/ML IJ SOLN: 0.2500 mg | Freq: Once | INTRAMUSCULAR | Status: DC | PRN

## 2020-01-11 MED ORDER — LABETALOL HCL 5 MG/ML IV SOLN
20.0000 mg | INTRAVENOUS | Status: DC | PRN
  Administered 2020-01-11: 20 mg via INTRAVENOUS
  Filled 2020-01-11: qty 4

## 2020-01-11 MED ORDER — MISOPROSTOL 25 MCG QUARTER TABLET
25.0000 ug | ORAL_TABLET | ORAL | Status: DC | PRN
  Administered 2020-01-11 – 2020-01-12 (×4): 25 ug via VAGINAL
  Filled 2020-01-11 (×4): qty 1

## 2020-01-11 MED ORDER — MISOPROSTOL 200 MCG PO TABS
ORAL_TABLET | ORAL | Status: AC
  Filled 2020-01-11: qty 4

## 2020-01-11 MED ORDER — LACTATED RINGERS IV SOLN: 500.0000 mL | INTRAVENOUS | Status: DC | PRN

## 2020-01-11 MED ORDER — ACETAMINOPHEN 500 MG PO TABS
1000.0000 mg | ORAL_TABLET | Freq: Four times a day (QID) | ORAL | Status: DC | PRN
  Administered 2020-01-11 – 2020-01-12 (×4): 1000 mg via ORAL
  Filled 2020-01-11 (×3): qty 2

## 2020-01-11 MED ORDER — OXYTOCIN BOLUS FROM INFUSION: 500.0000 mL | Freq: Once | INTRAVENOUS | Status: DC

## 2020-01-11 MED ORDER — CLONAZEPAM 0.5 MG PO TABS
0.5000 mg | ORAL_TABLET | Freq: Once | ORAL | Status: AC
  Administered 2020-01-11: 0.5 mg via ORAL
  Filled 2020-01-11: qty 1

## 2020-01-11 MED ORDER — OXYTOCIN 40 UNITS IN NORMAL SALINE INFUSION - SIMPLE MED
2.5000 [IU]/h | INTRAVENOUS | Status: DC
  Filled 2020-01-11: qty 1000

## 2020-01-11 MED ORDER — HYDRALAZINE HCL 20 MG/ML IJ SOLN: 10.0000 mg | INTRAMUSCULAR | Status: DC | PRN

## 2020-01-11 MED ORDER — TRIMETHOBENZAMIDE HCL 300 MG PO CAPS
300.0000 mg | ORAL_CAPSULE | Freq: Four times a day (QID) | ORAL | Status: DC | PRN
  Filled 2020-01-11: qty 1

## 2020-01-11 MED ORDER — LACTATED RINGERS IV SOLN: INTRAVENOUS | Status: DC

## 2020-01-11 NOTE — Progress Notes (Signed)
Seizure, contact, and airborne precautions initiated.  Bps cycling q1hr.  Consult to diabetes coordinator initiated- she will contact the provider and figure out a plan of care. Pt. stable at this time. Monitors assessing.

## 2020-01-11 NOTE — Progress Notes (Signed)
Inpatient Diabetes Program Recommendations  AACE/ADA: New Consensus Statement on Inpatient Glycemic Control (2015)  Target Ranges:  Prepandial:   less than 140 mg/dL      Peak postprandial:   less than 180 mg/dL (1-2 hours)      Critically ill patients:  140 - 180 mg/dL   Lab Results  Component Value Date   GLUCAP 126 (H) 01/11/2020   HGBA1C 6.9 (H) 12/10/2019    Review of Glycemic Control Results for Joy [name], Joy [name] (MRN [id number]) as of 01/11/2020 11:10  Ref. Range 01/11/2020 08:57 01/11/2020 10:39  Glucose-Capillary Latest Ref Range: 70 - 99 mg/dL 720 (H) 947 (H)   Diabetes history: DM 2 (possibly LADA) Outpatient Diabetes medications:  Lantus 38 units daily, Humalog 16 units tid with meals  Inpatient Diabetes Program Recommendations:    Spoke to midwife regarding patient care.  She is being induced starting today but will maintain regular diet.  Midwife, Jill Side will continue home insulin doses until patient is in active labor.  Will follow.   Thanks,  Beryl Meager, RN, BC-ADM Inpatient Diabetes Coordinator Pager [phone] (8a-5p)

## 2020-01-11 NOTE — Progress Notes (Addendum)
Bed padding for seizure precautions removed by patient due to feeling claustrophobic. Pt. States she understands the risks of not having pads on bed and is refusing them at this time. Education provided, provider made aware.

## 2020-01-11 NOTE — H&P (Signed)
OB History & Physical   History of Present Illness:  Chief Complaint:   HPI:  Joy [name] is a 29 y.o. G2P0010 female at [redacted]w[redacted]d dated by a 12 week ultrasound.  Her pregnancy has been significantly complicated by Type 2 DM on insulin, CHTN (taking clonidine 0.3 mgm daily), prolonged QT syndrome, Aspergers, obesity, asthma, seizure disorder, paranoid personality disorder, single umbilical artery, anxiety, dental decay, migraine headaches,  and chronic back pain. She takes multiple medications including clonidine for CHTN, Baclofen for muscle spasms in her back, clonazepam for anxiety, Benadryl, and Tigan for nausea. She was on Metformin 1000 mgm BID at one time, but she has not taken this in the last week.  She presents to L&D for an induction of labor for T2DM not well controlled on insulin and CHTN.  Patient was hospitalized several days in January for hyperglycemia. Currently patient is taking Lantus 38 U qhs and 14-18 Humalog with meals.Last growth scan on 01/04/2020 EFW was 5#12oz. NSTs and AFIs have been normal.    Prenatal care site: Prenatal care started in [place] and she transferred to  [facility] in her third trimester. Her PNC has also been remarkable for a 26#weight gain and the following:  Nursing Staff Provider  Office Location  [facility] Dating   12 wk [place]  Language  English Anatomy US   complete (outside institution)  Flu Vaccine  declined Genetic Screen  NIPS: normal XX   TDaP vaccine   12/14/2019 Hgb A1C or  GTT Third trimester : 6.9  Rhogam   not needed   LAB RESULTS   Feeding Plan Bottle Blood Type B/Positive/-- (08/24 0000)   Contraception condoms Antibody Negative (08/24 0000)  Circumcision  Rubella Immune (08/24 0000)  Pediatrician   RPR Nonreactive (08/24 0000)   Support Person  Boyfriend HBsAg Negative (08/24 0000)   Prenatal Classes  HIV Non-reactive, Non-reactive (08/24 0000)    Varicella   BTL Consent  GBS  negative 2/8       VBAC Consent  no needed Pap  NIL  12/11/2019    Hgb Electro   not needed    CF  negative     SMA  negative        Normal Inheritest Normal Materniti 21 XX     Maternal Medical History:   Past Medical History:  Diagnosis Date  . Anxiety   . Asperger's syndrome   . Diabetes mellitus without complication ([place])   . Headache   . Long Q-T syndrome   . Mental disorder    Depression. past suicide attempt  . Seizures ([place])     Past Surgical History:  Procedure Laterality Date  . TONSILLECTOMY      Allergies  Allergen Reactions  . Liraglutide     Drug induced acute pancreatitis.   Yvette Rack [Cyclobenzaprine]     Mood changes  . Gabapentin Other (See Comments)    Mood changes  . Latex Hives  . Manganese Trace Metal Additives [Manganese] Rash  . Other Rash    Prior to Admission medications   Medication Sig Start Date End Date Taking? Authorizing Provider  albuterol (VENTOLIN HFA) 108 (90 Base) MCG/ACT inhaler INHALE 2 PUFFS BY MOUTH EVERY 4 HOURS AS NEEDED FOR WHEEZING OR SHORTNESS OF BREATH 01/01/20  Yes [name], MD  baclofen (LIORESAL) 10 MG tablet Take 10 mg by mouth 3 (three) times daily.   Yes [provider]  cholecalciferol (VITAMIN D3) 25 MCG (1000 UNIT) tablet Take 1,000 Units by  mouth daily.   Yes [provider]  clonazePAM (KLONOPIN) 1 MG tablet Take 1 tablet (1 mg total) by mouth 2 (two) times daily. 12/10/19  Yes Schuman, Christanna R, MD  cloNIDine (CATAPRES) 0.3 MG tablet Take 1 tablet (0.3 mg total) by mouth daily. 12/10/19  Yes Schuman, Christanna R, MD  diphenhydrAMINE (BENADRYL) 25 mg capsule Take 25 mg by mouth at bedtime.   Yes [provider]  insulin glargine (LANTUS) 100 UNIT/ML injection Inject 0.38 mLs (38 Units total) into the skin daily. 12/19/19  Yes Vena Austria, MD  insulin lispro (HUMALOG) 100 UNIT/ML injection Inject 16 Units into the skin 3 (three) times daily before meals.   Yes [provider]  insulin lispro (HUMALOG) 100  UNIT/ML KwikPen Inject 0.18 mLs (18 Units total) into the skin 3 (three) times daily before meals. 12/28/19  Yes Vena Austria, MD  Insulin Pen Needle (PEN NEEDLES) 31G X 6 MM MISC 1 each by Does not apply route 4 (four) times daily. 12/10/19  Yes Schuman, Christanna R, MD  metFORMIN (GLUCOPHAGE) 1000 MG tablet Take 1,000 mg by mouth 2 (two) times daily with a meal.   Yes [provider]  Prenatal Vit-Fe Fumarate-FA (PRENATAL MULTIVITAMIN) TABS tablet Take 1 tablet by mouth daily at 12 noon.   Yes [provider]  trimethobenzamide (TIGAN) 300 MG capsule Take 1 capsule (300 mg total) by mouth 3 (three) times daily. 12/10/19  Yes Schuman, Jaquelyn Bitter, MD          Social History: She  reports that she has been smoking cigarettes. She has a 2.50 [name]-year smoking history. She has never used smokeless tobacco. She reports previous drug use. She reports that she does not drink alcohol.  Family History: family history includes Alzheimer's disease in her maternal grandfather; Asthma in her maternal grandmother; COPD in her mother; Cancer in her mother; Diabetes in her mother; Heart disease in her father and maternal grandmother; Multiple sclerosis in her maternal grandmother and mother; Parkinson's disease in her maternal grandmother; Supraventricular tachycardia in her brother; Thyroid cancer in her mother; Von Willebrand disease in her mother.   Review of Systems: Negative x 10 systems reviewed except as noted in the HPI.      Physical Exam:  Vital Signs: Temp 98.5 F (36.9 C) (Oral) Pulse 101  Resp 16   LMP 04/17/2019 Blood pressure : 154/97 General: gravid WF in NAD HEENT: normocephalic, atraumatic Heart: regular rate & rhythm.  No murmurs Lungs: clear to auscultation bilaterally Abdomen: soft, gravid, non-tender;  EFW: 6 1/2# Pelvic:   External: Normal external female genitalia  Cervix:  ? Closed/30%/-2 to -3 and deviated to left (unsure exam)  Extremities:  non-tender, symmetric, no edema bilaterally.  DTRs: +1 to +2  Neurologic: Alert & oriented x 3.   Baseline FHR: 150 baseline with accelerations to 180, moderate variability, no decelerations Toco: contractions mild, every 6-7 minutes apart.   Covid test positive this AM Initial CBG was 193 (was drinking OJ at that time Assessment:  Joy [name] is a 29 y.o. G2P0010 female at [redacted]w[redacted]d with T2DM on insulin CHTN with blood pressure in mild range  R/O preeclampsia Covid positive Prolonged QT intervalFWB: Cat 1 tracing  Plan:  1. Admit to Labor & Delivery on Airborne Isolation Precautions for IOL 2.  PIH labs, RPR, repeat CBG in 2 hours. Diabetic Coordinator consult 3. GBS negative.   4. Consents obtained. 5. CHO restricted diet. Plan Continuing on [place] insulin until in active  labor then switching to endotool and IV insulin.  6. Avoid drugs that may prolong QT interval. 7. Consulted Dr Leatha Gilding regarding use of Pitocin. Telemetry if placed on Pitocin 8.  Bottle feeding 9. Condoms 10. B POS/ RI  Farrel Conners  01/11/2020 9:41 AM

## 2020-01-11 NOTE — Progress Notes (Addendum)
L&D Progress Note    S: Complaining of frontal headache-rating it 4/10 (hx of headaches). Has been nauseous and received Tigan IM with some relief. Has had blurry vision x 2 weeks (has not had eyes checked in years), and some scintillating scotomata more recently. Complains of back spasms. Desires Baclofen  O: Vital signs: BP (!) 157/91   Pulse 95   Temp 98.3 F (36.8 C) (Oral)   Resp 18   Ht 5\' 5"  (1.651 m)   Wt 100.7 kg   LMP 04/17/2019   BMI 36.94 kg/m   Patient Vitals for the past 24 hrs:  BP Temp Temp src Pulse Resp Height Weight  01/11/20 1740 (!) 157/91 - - 95 - - -  01/11/20 1725 (!) 162/94 - - 94 - - -  01/11/20 1710 (!) 159/103 - - 94 - - -  01/11/20 1633 (!) 151/93 - - 91 - - -  01/11/20 1610 (!) 157/93 - - 94 - - -  01/11/20 1511 (!) 145/93 - - (!) 101 - - -  01/11/20 1411 (!) 141/86 - - (!) 101 - - -  01/11/20 1410 - 98.3 F (36.8 C) Oral - 18 - -  01/11/20 1104 - - - - - 5\' 5"  (1.651 m) 100.7 kg  01/11/20 1041 (!) 147/90 98.5 F (36.9 C) - (!) 110 - - -  01/11/20 1008 (!) 147/101 - - (!) 101 - - -  01/11/20 0836 (!) 154/97 - - (!) 101 - - -  01/11/20 0835 - 98.5 F (36.9 C) Oral - 16 - -   Had a severe range blood pressure during contraction then repeat 157/91  FHR: baseline 140s with accelerations, moderate to marked variability, no decelerations Toco: contractions 2-6 minutes apart, palpate mild  Cervix: ? Dimple/80%/-2 to -3 Second Cytotec inserted  LAbs:  Results for orders placed or performed during the hospital encounter of 01/11/20 (from the past 24 hour(s))  Respiratory Panel by RT PCR (Flu A&B, Covid) - Nasopharyngeal Swab     Status: Abnormal   Collection Time: 01/11/20  8:22 AM   Specimen: Nasopharyngeal Swab  Result Value Ref Range   SARS Coronavirus 2 by RT PCR POSITIVE (A) NEGATIVE   Influenza A by PCR NEGATIVE NEGATIVE   Influenza B by PCR NEGATIVE NEGATIVE  Protein / creatinine ratio, urine     Status: Abnormal   Collection Time:  01/11/20  8:22 AM  Result Value Ref Range   Creatinine, Urine 99 mg/dL   Total Protein, Urine 32 mg/dL   Protein Creatinine Ratio 0.32 (H) 0.00 - 0.15 mg/mg[Cre]  Glucose, capillary     Status: Abnormal   Collection Time: 01/11/20  8:57 AM  Result Value Ref Range   Glucose-Capillary 193 (H) 70 - 99 mg/dL  Type and screen [facility]     Status: None   Collection Time: 01/11/20  9:47 AM  Result Value Ref Range   ABO/RH(D) B POS    Antibody Screen NEG    Sample Expiration      01/14/2020,2359 Performed at [facility], [address] [place], [address] [place]   CBC     Status: Abnormal   Collection Time: 01/11/20  9:47 AM  Result Value Ref Range   WBC 13.9 (H) 4.0 - 10.5 K/uL   RBC 4.52 3.87 - 5.11 MIL/uL   Hemoglobin 12.6 12.0 - 15.0 g/dL   HCT 70017 01/13/20 - 49.4 %   MCV 83.6 80.0 - 100.0  fL   MCH 27.9 26.0 - 34.0 pg   MCHC 33.3 30.0 - 36.0 g/dL   RDW 15.5 11.5 - 15.5 %   Platelets 312 150 - 400 K/uL   nRBC 0.0 0.0 - 0.2 %  Comprehensive metabolic panel     Status: Abnormal   Collection Time: 01/11/20  9:47 AM  Result Value Ref Range   Sodium 134 (L) 135 - 145 mmol/L   Potassium 4.0 3.5 - 5.1 mmol/L   Chloride 105 98 - 111 mmol/L   CO2 19 (L) 22 - 32 mmol/L   Glucose, Bld 149 (H) 70 - 99 mg/dL   BUN 9 6 - 20 mg/dL   Creatinine, Ser 0.55 0.44 - 1.00 mg/dL   Calcium 9.0 8.9 - 10.3 mg/dL   Total Protein 6.2 (L) 6.5 - 8.1 g/dL   Albumin 2.7 (L) 3.5 - 5.0 g/dL   AST 18 15 - 41 U/L   ALT 19 0 - 44 U/L   Alkaline Phosphatase 95 38 - 126 U/L   Total Bilirubin 0.3 0.3 - 1.2 mg/dL   GFR calc non Af Amer >60 >60 mL/min   GFR calc Af Amer >60 >60 mL/min   Anion gap 10 5 - 15  Glucose, capillary     Status: Abnormal   Collection Time: 01/11/20 10:39 AM  Result Value Ref Range   Glucose-Capillary 126 (H) 70 - 99 mg/dL  Glucose, capillary     Status: Abnormal   Collection Time: 01/11/20  3:34 PM  Result Value Ref Range   Glucose-Capillary 117  (H) 70 - 99 mg/dL     A/P: CHTN with superimposed preeclampsia based on PC ratio  Most blood pressures elevated in the mild range  If develops preeclampsia with severe features-treat with labetalol, start  Magnesium and telemetry  Tylenol 1000 mgm for headache was given T2DM: Blood sugars decent on [place] insulin at this time Back spasms: Baclofen 10 mgm BID prn-notified nursery of possiblity of withdrawal symptoms in baby. Informed Dr Kenton Kingfisher of plan of management.   Dalia Heading, CNM  01/11/2020 1820 Headache not any better with Tylenol. Blood pressures border on severe range Has history of seizures, has a low seizure threshold Will start magnesium sulfate. PLan on giving Clonidine this evening also Add labetalol if needed for severe range pressures  Dalia Heading, CNM

## 2020-01-11 NOTE — Progress Notes (Signed)
   01/11/20 1300  Clinical Encounter Type  Visited With Health care provider  Visit Type Initial  Referral From Nurse  Consult/Referral To Chaplain  On Chaplain's way to patient's room she stopped by the nurse's station and was told patient is COVID positive. Chaplain left AD with nurse.

## 2020-01-11 NOTE — Progress Notes (Addendum)
Magnesium 4 g bolus initiated per CNM order.Bedside commode placed at bedside; pt. Educated on strict intake/output.  Bps cycling q30 min.  Cardiac monitoring initiated.  Patient re-educated on seizure precautions but continues to refuse bed pads.  Pt. Stable at this time, monitors assessing.

## 2020-01-12 ENCOUNTER — Inpatient Hospital Stay: Payer: Medicaid Other | Admitting: Anesthesiology

## 2020-01-12 LAB — COMPREHENSIVE METABOLIC PANEL
ALT: 21 U/L (ref 0–44)
AST: 25 U/L (ref 15–41)
Albumin: 2.9 g/dL — ABNORMAL LOW (ref 3.5–5.0)
Alkaline Phosphatase: 107 U/L (ref 38–126)
Anion gap: 11 (ref 5–15)
BUN: 12 mg/dL (ref 6–20)
CO2: 21 mmol/L — ABNORMAL LOW (ref 22–32)
Calcium: 8.3 mg/dL — ABNORMAL LOW (ref 8.9–10.3)
Chloride: 103 mmol/L (ref 98–111)
Creatinine, Ser: 0.54 mg/dL (ref 0.44–1.00)
GFR calc Af Amer: >60 mL/min (ref >60)
GFR calc non Af Amer: >60 mL/min (ref >60)
Glucose, Bld: 122 mg/dL — ABNORMAL HIGH (ref 70–99)
Potassium: 4.1 mmol/L (ref 3.5–5.1)
Sodium: 135 mmol/L (ref 135–145)
Total Bilirubin: 0.4 mg/dL (ref 0.3–1.2)
Total Protein: 6.8 g/dL (ref 6.5–8.1)

## 2020-01-12 LAB — GLUCOSE, CAPILLARY
Glucose-Capillary: 142 mg/dL — ABNORMAL HIGH (ref 70–99)
Glucose-Capillary: 143 mg/dL — ABNORMAL HIGH (ref 70–99)
Glucose-Capillary: 142 mg/dL — ABNORMAL HIGH (ref 70–99)
Glucose-Capillary: 153 mg/dL — ABNORMAL HIGH (ref 70–99)
Glucose-Capillary: 117 mg/dL — ABNORMAL HIGH (ref 70–99)
Glucose-Capillary: 198 mg/dL — ABNORMAL HIGH (ref 70–99)

## 2020-01-12 LAB — CBC
HCT: 41.5 % (ref 36.0–46.0)
Hemoglobin: 13.7 g/dL (ref 12.0–15.0)
MCH: 27.7 pg (ref 26.0–34.0)
MCHC: 33 g/dL (ref 30.0–36.0)
MCV: 84 fL (ref 80.0–100.0)
Platelets: 329 10*3/uL (ref 150–400)
RBC: 4.94 MIL/uL (ref 3.87–5.11)
RDW: 15.3 % (ref 11.5–15.5)
WBC: 12.7 10*3/uL — ABNORMAL HIGH (ref 4.0–10.5)
nRBC: 0 % (ref 0.0–0.2)

## 2020-01-12 LAB — RPR: RPR Ser Ql: NONREACTIVE

## 2020-01-12 MED ORDER — TERBUTALINE SULFATE 1 MG/ML IJ SOLN: 0.2500 mg | Freq: Once | INTRAMUSCULAR | Status: DC | PRN

## 2020-01-12 MED ORDER — BACLOFEN 10 MG PO TABS
10.0000 mg | ORAL_TABLET | Freq: Three times a day (TID) | ORAL | Status: DC
  Administered 2020-01-12 – 2020-01-16 (×11): 10 mg via ORAL
  Filled 2020-01-12 (×13): qty 1

## 2020-01-12 MED ORDER — CLONIDINE HCL 0.1 MG PO TABS
0.3000 mg | ORAL_TABLET | Freq: Every day | ORAL | Status: DC
  Administered 2020-01-12 – 2020-01-15 (×4): 0.3 mg via ORAL
  Filled 2020-01-12 (×6): qty 3

## 2020-01-12 MED ORDER — INSULIN ASPART 100 UNIT/ML ~~LOC~~ SOLN
0.0000 [IU] | SUBCUTANEOUS | Status: DC
  Administered 2020-01-12 – 2020-01-13 (×3): 3 [IU] via SUBCUTANEOUS
  Filled 2020-01-12 (×3): qty 1

## 2020-01-12 MED ORDER — BUTORPHANOL TARTRATE 1 MG/ML IJ SOLN
1.0000 mg | INTRAMUSCULAR | Status: DC | PRN
  Administered 2020-01-12 (×2): 1 mg via INTRAVENOUS
  Filled 2020-01-12 (×2): qty 1

## 2020-01-12 MED ORDER — FENTANYL 2.5 MCG/ML W/ROPIVACAINE 0.15% IN NS 100 ML EPIDURAL (ARMC)
EPIDURAL | Status: AC
  Filled 2020-01-12: qty 100

## 2020-01-12 MED ORDER — CLONAZEPAM 1 MG PO TABS
1.0000 mg | ORAL_TABLET | Freq: Once | ORAL | Status: AC
  Administered 2020-01-12: 1 mg via ORAL
  Filled 2020-01-12: qty 1

## 2020-01-12 MED ORDER — OXYTOCIN 40 UNITS IN NORMAL SALINE INFUSION - SIMPLE MED
1.0000 m[IU]/min | INTRAVENOUS | Status: DC
  Administered 2020-01-12: 2 m[IU]/min via INTRAVENOUS
  Filled 2020-01-12: qty 1000

## 2020-01-12 MED ORDER — INSULIN GLARGINE 100 UNIT/ML ~~LOC~~ SOLN
45.0000 [IU] | Freq: Every day | SUBCUTANEOUS | Status: DC
  Administered 2020-01-12: 45 [IU] via SUBCUTANEOUS
  Filled 2020-01-12 (×2): qty 0.45

## 2020-01-12 NOTE — Progress Notes (Signed)
   Subjective:  Painfully contracting on pitocin.    Objective:  Temp:  [97.7 F (36.5 C)-98.7 F (37.1 C)] 98.7 F (37.1 C) (02/16 1300) Pulse Rate:  [82-107] 96 (02/16 1625) Resp:  [12-31] 25 (02/16 1748) BP: (114-168)/(65-100) 130/69 (02/16 1625) SpO2:  [92 %-99 %] 95 % (02/16 1748)  General: painfully contracting  Abdomen: soft, non-tender. Contractions palpate moderate Cervical Exam:  Dilation: Fingertip Effacement (%): 70 Cervical Position: Posterior Station: -3 Presentation: Vertex Exam by:: Cirilo Canner MD Patient much more tolerant of exam, cervix remain on patient left and very posterior still  FHT: 145, moderate, +accels, no decels Toco: q1-30min  Results for orders placed or performed during the hospital encounter of 01/11/20 (from the past 24 hour(s))  Glucose, capillary     Status: Abnormal   Collection Time: 01/11/20  9:10 PM  Result Value Ref Range   Glucose-Capillary 118 (H) 70 - 99 mg/dL  Glucose, capillary     Status: Abnormal   Collection Time: 01/12/20  7:35 AM  Result Value Ref Range   Glucose-Capillary 142 (H) 70 - 99 mg/dL  Glucose, capillary     Status: Abnormal   Collection Time: 01/12/20 11:11 AM  Result Value Ref Range   Glucose-Capillary 143 (H) 70 - 99 mg/dL  Glucose, capillary     Status: Abnormal   Collection Time: 01/12/20 12:23 PM  Result Value Ref Range   Glucose-Capillary 142 (H) 70 - 99 mg/dL  Glucose, capillary     Status: Abnormal   Collection Time: 01/12/20  3:06 PM  Result Value Ref Range   Glucose-Capillary 153 (H) 70 - 99 mg/dL    Assessment:   29 y.o. G2P0010 [redacted]w[redacted]d IOL DMII insulin dependent and poorly controlled, superimposed preeclampsia  Plan:   1) Labor - continue to titrate pitocin.  Has been hesitant about epidural but is willing to talk to anesthesia to discuss her concerns.  We discussed that my other concern is that her pseudoseizures are also trigger by pain, these are usually just evidence by patient being  catatonic and no clonic/tonic activity as would be expected with an eclamptic seizure but I think it may still hamper decision making for the patient as labor progresses.   - continue to titrate pitocin - continue cardiac monitoring given prolonged Q-T syndrome  2) Fetus -  Cat I tracing  3) Superimposed preeclampsia - BP have remained mild range, continue magnesium sulfate - recycle labs this evening  4) Baclofen for back spasms - patient usually take tid, order frequency changed to reflect this  5) DM - now on clear liquid diet only, holding meal time insulin, increased frequency of accuchecks to q4-hrs.  Long acting evening insulin dose increased from 38U to 45U.  May need to adjust sliding scale insulin dose.  Vena Austria, MD, [facility], [facility] 01/12/2020, 5:59 PM
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
## 2020-01-12 NOTE — Anesthesia Preprocedure Evaluation (Signed)
Anesthesia Evaluation  Patient identified by MRN, date of birth, ID band Patient awake    Reviewed: Allergy & Precautions, H&P , NPO status , Patient's Chart, lab work & pertinent test results, reviewed documented beta blocker date and time   Airway Mallampati: II  TM Distance: >3 FB Neck ROM: full    Dental no notable dental hx. (+) Teeth Intact   Pulmonary asthma , Current Smoker,    Pulmonary exam normal breath sounds clear to auscultation       Cardiovascular Exercise Tolerance: Good hypertension, On Medications  Rhythm:regular Rate:Normal     Neuro/Psych  Headaches, Seizures -,  PSYCHIATRIC DISORDERS Anxiety    GI/Hepatic negative GI ROS, Neg liver ROS,   Endo/Other  negative endocrine ROSdiabetes, Gestational  Renal/GU      Musculoskeletal   Abdominal   Peds  Hematology negative hematology ROS (+)   Anesthesia Other Findings   Reproductive/Obstetrics (+) Pregnancy                             Anesthesia Physical Anesthesia Plan  ASA: III  Anesthesia Plan: Epidural   Post-op Pain Management:    Induction:   PONV Risk Score and Plan:   Airway Management Planned:   Additional Equipment:   Intra-op Plan:   Post-operative Plan:   Informed Consent: I have reviewed the patients History and Physical, chart, labs and discussed the procedure including the risks, benefits and alternatives for the proposed anesthesia with the patient or authorized representative who has indicated his/her understanding and acceptance.       Plan Discussed with:   Anesthesia Plan Comments:         Anesthesia Quick Evaluation

## 2020-01-12 NOTE — Progress Notes (Signed)
L&D Progress Note  S: Has a headache this AM and some scintillating scotomata. Only slept for about an hour last night-from 4-5 AM . Back pain and contractions kept her uncomfortable. Has been eating thru the night: diabetic jello, chips. Had a [place] sandwich for a snack last night at 2230-2300. Received Stadol IV  O: BP 130/75   Pulse 94   Temp 98.3 F (36.8 C) (Oral)   Resp (!) 21   Ht 5\' 5"  (1.651 m)   Wt 100.7 kg   LMP 04/17/2019   SpO2 98%   BMI 36.94 kg/m    Had severe range blood pressures last night at 2200, after receiving Clonidine 0.3mg m . She received labetalol 20 mgm IV and her blood pressures have been normal to mild range since then.  CBG this AM:142-not fasting  Urine output from 1900 to 0700 was 2600 ml Heart: RRR without murmur Lungs: CTAB, normal respiratory effort  Abdomen: contractions palpate moderate to firm.   FHR: 135 with moderate variability since Stadol at 0637 Toco: contractions every 3-5 minutes apart.  Cervix: still unable to find cervix   A/P: CHTN with superimposed preeclampsia with severe features-mild to normal range blood pressures thru the night  Tylenol for headache this AM  Continue Clonidine 0.3 mgm daily at hs  Labetalol for severe range blood pressures\  Excellent urine output T2DM on insulin: Continues on Lantus 38 U at hs and 12 U with meals  Have not needed correction insulin  Has 2 IVs now if endotool is employed  Continue CHO restricted diet for now Hx of Prolonged QT: on telemetry-QT interval has been normal.   Continue to avoid medications that can prolong QT Covid 19 infection: continue isolation precautions IOL: Cytotex #4 inserted now  No progress so far-assuming cervix is closed  04/19/2019, CNM   Farrel Conners, CNM

## 2020-01-12 NOTE — Progress Notes (Signed)
Educated pt on importance of position changing in the presence of late decelerations. Pt verbalized understanding and refused. Oxytocin drip titrated down from 4 milli-units/min to 2 milli-units/min.
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
## 2020-01-12 NOTE — Progress Notes (Signed)
Inpatient Diabetes Program Recommendations  AACE/ADA: New Consensus Statement on Inpatient Glycemic Control (2015)  Target Ranges:  Prepandial:   less than 140 mg/dL      Peak postprandial:   less than 180 mg/dL (1-2 hours)      Critically ill patients:  140 - 180 mg/dL   Lab Results  Component Value Date   GLUCAP 142 (H) 01/12/2020   HGBA1C 6.9 (H) 12/10/2019    Review of Glycemic Control Diabetes history: DM 2 (possibly LADA) Outpatient Diabetes medications:  Lantus 38 units daily, Humalog 16 units tid with meals  Current orders for Inpatient glycemic control:  Novolog 0-14 units tid 2 hours after meals Novolog 12 units tid with meals Lantus 38 units q HS  Inpatient Diabetes Program Recommendations:    Please consider adding 2 AM blood sugar check and cover with Novolog Correction (0-14 units). Once diet d/c'd and patient in active labor, will need orders for IV insulin/Endotool.   Thanks  Beryl Meager, RN, BC-ADM Inpatient Diabetes Coordinator Pager [phone] (8a-5p)
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
## 2020-01-13 ENCOUNTER — Encounter: Payer: Self-pay | Admitting: Obstetrics and Gynecology

## 2020-01-13 ENCOUNTER — Encounter
Admission: EM | Disposition: A | Discharge status: Home / Self Care | Payer: Self-pay | Source: Home / Self Care | Attending: Obstetrics & Gynecology

## 2020-01-13 ENCOUNTER — Inpatient Hospital Stay: Payer: Medicaid Other

## 2020-01-13 DIAGNOSIS — O99892 Other specified diseases and conditions complicating childbirth: Secondary | ICD-10-CM

## 2020-01-13 DIAGNOSIS — E1169 Type 2 diabetes mellitus with other specified complication: Secondary | ICD-10-CM

## 2020-01-13 DIAGNOSIS — O2412 Pre-existing diabetes mellitus, type 2, in childbirth: Secondary | ICD-10-CM

## 2020-01-13 DIAGNOSIS — O99214 Obesity complicating childbirth: Secondary | ICD-10-CM

## 2020-01-13 DIAGNOSIS — U071 COVID-19: Secondary | ICD-10-CM

## 2020-01-13 DIAGNOSIS — O114 Pre-existing hypertension with pre-eclampsia, complicating childbirth: Secondary | ICD-10-CM

## 2020-01-13 LAB — GLUCOSE, CAPILLARY
Glucose-Capillary: 172 mg/dL — ABNORMAL HIGH (ref 70–99)
Glucose-Capillary: 161 mg/dL — ABNORMAL HIGH (ref 70–99)
Glucose-Capillary: 167 mg/dL — ABNORMAL HIGH (ref 70–99)
Glucose-Capillary: 110 mg/dL — ABNORMAL HIGH (ref 70–99)
Glucose-Capillary: 174 mg/dL — ABNORMAL HIGH (ref 70–99)
Glucose-Capillary: 219 mg/dL — ABNORMAL HIGH (ref 70–99)
Glucose-Capillary: 171 mg/dL — ABNORMAL HIGH (ref 70–99)

## 2020-01-13 LAB — CBC
HCT: 34.9 % — ABNORMAL LOW (ref 36.0–46.0)
Hemoglobin: 11.3 g/dL — ABNORMAL LOW (ref 12.0–15.0)
MCH: 27.6 pg (ref 26.0–34.0)
MCHC: 32.4 g/dL (ref 30.0–36.0)
MCV: 85.3 fL (ref 80.0–100.0)
Platelets: 284 10*3/uL (ref 150–400)
RBC: 4.09 MIL/uL (ref 3.87–5.11)
RDW: 15.2 % (ref 11.5–15.5)
WBC: 14.2 10*3/uL — ABNORMAL HIGH (ref 4.0–10.5)
nRBC: 0 % (ref 0.0–0.2)

## 2020-01-13 SURGERY — Surgical Case
Anesthesia: Epidural | Site: Abdomen

## 2020-01-13 MED ORDER — DIBUCAINE (PERIANAL) 1 % EX OINT: 1.0000 "application " | TOPICAL_OINTMENT | CUTANEOUS | Status: DC | PRN

## 2020-01-13 MED ORDER — FENTANYL CITRATE (PF) 100 MCG/2ML IJ SOLN
25.0000 ug | INTRAMUSCULAR | Status: DC | PRN
  Administered 2020-01-13 (×2): 25 ug via INTRAVENOUS
  Filled 2020-01-13: qty 2

## 2020-01-13 MED ORDER — FENTANYL CITRATE (PF) 100 MCG/2ML IJ SOLN
INTRAMUSCULAR | Status: AC
  Filled 2020-01-13: qty 2

## 2020-01-13 MED ORDER — OXYTOCIN 40 UNITS IN NORMAL SALINE INFUSION - SIMPLE MED
INTRAVENOUS | Status: DC | PRN
  Administered 2020-01-13: 700 mL via INTRAVENOUS

## 2020-01-13 MED ORDER — ALBUTEROL SULFATE HFA 108 (90 BASE) MCG/ACT IN AERS
2.0000 | INHALATION_SPRAY | RESPIRATORY_TRACT | Status: DC | PRN
  Administered 2020-01-13 (×3): 2 via RESPIRATORY_TRACT
  Filled 2020-01-13: qty 6.7

## 2020-01-13 MED ORDER — SODIUM CHLORIDE FLUSH 0.9 % IV SOLN
INTRAVENOUS | Status: AC
  Filled 2020-01-13: qty 10

## 2020-01-13 MED ORDER — SIMETHICONE 80 MG PO CHEW: 80.0000 mg | CHEWABLE_TABLET | ORAL | Status: DC | PRN

## 2020-01-13 MED ORDER — ALBUTEROL SULFATE HFA 108 (90 BASE) MCG/ACT IN AERS
INHALATION_SPRAY | RESPIRATORY_TRACT | Status: DC | PRN
  Administered 2020-01-13: 2 via RESPIRATORY_TRACT

## 2020-01-13 MED ORDER — FENTANYL CITRATE (PF) 100 MCG/2ML IJ SOLN
50.0000 ug | Freq: Once | INTRAMUSCULAR | Status: AC
  Administered 2020-01-13: 50 ug via INTRAVENOUS

## 2020-01-13 MED ORDER — BUPIVACAINE HCL 0.5 % IJ SOLN
20.0000 mL | INTRAMUSCULAR | Status: DC
  Filled 2020-01-13: qty 20

## 2020-01-13 MED ORDER — PROPOFOL 10 MG/ML IV BOLUS
INTRAVENOUS | Status: AC
  Filled 2020-01-13: qty 20

## 2020-01-13 MED ORDER — SIMETHICONE 80 MG PO CHEW
80.0000 mg | CHEWABLE_TABLET | ORAL | Status: DC
  Administered 2020-01-13 – 2020-01-14 (×2): 80 mg via ORAL

## 2020-01-13 MED ORDER — OXYTOCIN 40 UNITS IN NORMAL SALINE INFUSION - SIMPLE MED
2.5000 [IU]/h | INTRAVENOUS | Status: DC
  Administered 2020-01-13: 2.5 [IU]/h via INTRAVENOUS

## 2020-01-13 MED ORDER — CEFAZOLIN SODIUM-DEXTROSE 2-4 GM/100ML-% IV SOLN
2.0000 g | INTRAVENOUS | Status: AC
  Administered 2020-01-13: 2 g via INTRAVENOUS
  Filled 2020-01-13: qty 100

## 2020-01-13 MED ORDER — PROPOFOL 10 MG/ML IV BOLUS
INTRAVENOUS | Status: DC | PRN
  Administered 2020-01-13: 170 mg via INTRAVENOUS

## 2020-01-13 MED ORDER — FENTANYL CITRATE (PF) 100 MCG/2ML IJ SOLN
INTRAMUSCULAR | Status: AC
  Administered 2020-01-13: 25 ug via INTRAVENOUS
  Filled 2020-01-13: qty 2

## 2020-01-13 MED ORDER — SOD CITRATE-CITRIC ACID 500-334 MG/5ML PO SOLN
ORAL | Status: AC
  Filled 2020-01-13: qty 15

## 2020-01-13 MED ORDER — PROMETHAZINE HCL 25 MG/ML IJ SOLN
INTRAMUSCULAR | Status: AC
  Filled 2020-01-13: qty 1

## 2020-01-13 MED ORDER — ONDANSETRON HCL 4 MG/2ML IJ SOLN
INTRAMUSCULAR | Status: AC
  Filled 2020-01-13: qty 2

## 2020-01-13 MED ORDER — SENNOSIDES-DOCUSATE SODIUM 8.6-50 MG PO TABS
2.0000 | ORAL_TABLET | ORAL | Status: DC
  Administered 2020-01-13 – 2020-01-15 (×3): 2 via ORAL
  Filled 2020-01-13 (×3): qty 2

## 2020-01-13 MED ORDER — MIDAZOLAM HCL 2 MG/2ML IJ SOLN
INTRAMUSCULAR | Status: AC
  Filled 2020-01-13: qty 2

## 2020-01-13 MED ORDER — CLONAZEPAM 0.5 MG PO TABS
0.5000 mg | ORAL_TABLET | Freq: Two times a day (BID) | ORAL | Status: DC
  Administered 2020-01-13 – 2020-01-16 (×7): 0.5 mg via ORAL
  Filled 2020-01-13 (×8): qty 1

## 2020-01-13 MED ORDER — LACTATED RINGERS IV SOLN: INTRAVENOUS | Status: DC

## 2020-01-13 MED ORDER — LACTATED RINGERS IV SOLN: INTRAVENOUS | Status: DC | PRN

## 2020-01-13 MED ORDER — SODIUM CHLORIDE 0.9 % IV SOLN
500.0000 mg | Freq: Once | INTRAVENOUS | Status: AC
  Administered 2020-01-13 (×2): 500 mg via INTRAVENOUS
  Filled 2020-01-13: qty 500

## 2020-01-13 MED ORDER — ONDANSETRON HCL 4 MG/2ML IJ SOLN
INTRAMUSCULAR | Status: DC | PRN
  Administered 2020-01-13: 4 mg via INTRAVENOUS

## 2020-01-13 MED ORDER — SOD CITRATE-CITRIC ACID 500-334 MG/5ML PO SOLN
30.0000 mL | ORAL | Status: AC
  Administered 2020-01-13: 30 mL via ORAL

## 2020-01-13 MED ORDER — BUPIVACAINE 0.25 % ON-Q PUMP DUAL CATH 400 ML
400.0000 mL | INJECTION | Status: DC
  Filled 2020-01-13: qty 400

## 2020-01-13 MED ORDER — IBUPROFEN 800 MG PO TABS
800.0000 mg | ORAL_TABLET | Freq: Three times a day (TID) | ORAL | Status: AC
  Administered 2020-01-13 – 2020-01-15 (×9): 800 mg via ORAL
  Filled 2020-01-13 (×9): qty 1

## 2020-01-13 MED ORDER — SIMETHICONE 80 MG PO CHEW
80.0000 mg | CHEWABLE_TABLET | Freq: Three times a day (TID) | ORAL | Status: DC
  Administered 2020-01-13 – 2020-01-15 (×6): 80 mg via ORAL
  Filled 2020-01-13 (×9): qty 1

## 2020-01-13 MED ORDER — INSULIN ASPART 100 UNIT/ML ~~LOC~~ SOLN
5.0000 [IU] | Freq: Three times a day (TID) | SUBCUTANEOUS | Status: DC
  Administered 2020-01-13 – 2020-01-16 (×10): 5 [IU] via SUBCUTANEOUS
  Filled 2020-01-13 (×11): qty 1

## 2020-01-13 MED ORDER — MENTHOL 3 MG MT LOZG
1.0000 | LOZENGE | OROMUCOSAL | Status: DC | PRN
  Filled 2020-01-13: qty 9

## 2020-01-13 MED ORDER — SUCCINYLCHOLINE CHLORIDE 20 MG/ML IJ SOLN
INTRAMUSCULAR | Status: DC | PRN
  Administered 2020-01-13: 160 mg via INTRAVENOUS

## 2020-01-13 MED ORDER — COCONUT OIL OIL: 1.0000 "application " | TOPICAL_OIL | Status: DC | PRN

## 2020-01-13 MED ORDER — ALBUTEROL SULFATE (5 MG/ML) 0.5% IN NEBU: 2.5000 mg | INHALATION_SOLUTION | RESPIRATORY_TRACT | Status: DC

## 2020-01-13 MED ORDER — WITCH HAZEL-GLYCERIN EX PADS: 1.0000 "application " | MEDICATED_PAD | CUTANEOUS | Status: DC | PRN

## 2020-01-13 MED ORDER — HYDROMORPHONE HCL 1 MG/ML IJ SOLN
INTRAMUSCULAR | Status: AC
  Filled 2020-01-13: qty 1

## 2020-01-13 MED ORDER — DIPHENHYDRAMINE HCL 25 MG PO CAPS: 25.0000 mg | ORAL_CAPSULE | Freq: Four times a day (QID) | ORAL | Status: DC | PRN

## 2020-01-13 MED ORDER — ENOXAPARIN SODIUM 40 MG/0.4ML ~~LOC~~ SOLN
40.0000 mg | SUBCUTANEOUS | Status: DC
  Administered 2020-01-14 – 2020-01-16 (×3): 40 mg via SUBCUTANEOUS
  Filled 2020-01-13 (×4): qty 0.4

## 2020-01-13 MED ORDER — INSULIN ASPART 100 UNIT/ML ~~LOC~~ SOLN
0.0000 [IU] | Freq: Four times a day (QID) | SUBCUTANEOUS | Status: DC
  Administered 2020-01-13: 4 [IU] via SUBCUTANEOUS
  Administered 2020-01-13 (×2): 3 [IU] via SUBCUTANEOUS
  Administered 2020-01-14 – 2020-01-15 (×3): 2 [IU] via SUBCUTANEOUS
  Filled 2020-01-13 (×4): qty 1

## 2020-01-13 MED ORDER — BUPIVACAINE HCL (PF) 0.5 % IJ SOLN
INTRAMUSCULAR | Status: DC | PRN
  Administered 2020-01-13: 10 mL

## 2020-01-13 MED ORDER — PRENATAL MULTIVITAMIN CH
1.0000 | ORAL_TABLET | Freq: Every day | ORAL | Status: DC
  Administered 2020-01-13 – 2020-01-16 (×4): 1 via ORAL
  Filled 2020-01-13 (×4): qty 1

## 2020-01-13 MED ORDER — BUPIVACAINE HCL (PF) 0.5 % IJ SOLN
INTRAMUSCULAR | Status: AC
  Filled 2020-01-13: qty 30

## 2020-01-13 MED ORDER — FENTANYL CITRATE (PF) 100 MCG/2ML IJ SOLN
25.0000 ug | INTRAMUSCULAR | Status: DC | PRN
  Administered 2020-01-13 (×2): 25 ug via INTRAVENOUS

## 2020-01-13 MED ORDER — INSULIN ASPART 100 UNIT/ML ~~LOC~~ SOLN
8.0000 [IU] | Freq: Three times a day (TID) | SUBCUTANEOUS | Status: DC
  Administered 2020-01-13: 8 [IU] via SUBCUTANEOUS
  Filled 2020-01-13: qty 1

## 2020-01-13 MED ORDER — OXYCODONE-ACETAMINOPHEN 5-325 MG PO TABS
1.0000 | ORAL_TABLET | ORAL | Status: DC | PRN
  Administered 2020-01-13 – 2020-01-16 (×19): 2 via ORAL
  Filled 2020-01-13 (×19): qty 2

## 2020-01-13 MED ORDER — FENTANYL CITRATE (PF) 100 MCG/2ML IJ SOLN
INTRAMUSCULAR | Status: DC | PRN
  Administered 2020-01-13 (×2): 100 ug via INTRAVENOUS
  Administered 2020-01-13 (×2): 50 ug via INTRAVENOUS

## 2020-01-13 MED ORDER — INSULIN GLARGINE 100 UNIT/ML ~~LOC~~ SOLN
30.0000 [IU] | Freq: Every day | SUBCUTANEOUS | Status: DC
  Administered 2020-01-13 – 2020-01-14 (×2): 30 [IU] via SUBCUTANEOUS
  Filled 2020-01-13 (×5): qty 0.3

## 2020-01-13 SURGICAL SUPPLY — 36 items
BAG COUNTER SPONGE EZ (MISCELLANEOUS) ×4 IMPLANT
CANISTER SUCT 3000ML PPV (MISCELLANEOUS) ×2 IMPLANT
CATH KIT ON-Q SILVERSOAK 5IN (CATHETERS) ×4 IMPLANT
CHLORAPREP W/TINT 26 (MISCELLANEOUS) ×4 IMPLANT
DERMABOND ADVANCED (GAUZE/BANDAGES/DRESSINGS) ×1
DERMABOND ADVANCED .7 DNX12 (GAUZE/BANDAGES/DRESSINGS) ×1 IMPLANT
DRSG OPSITE POSTOP 4X10 (GAUZE/BANDAGES/DRESSINGS) ×2 IMPLANT
DRSG TELFA 3X8 NADH (GAUZE/BANDAGES/DRESSINGS) IMPLANT
ELECT CAUTERY BLADE 6.4 (BLADE) ×2 IMPLANT
ELECT REM PT RETURN 9FT ADLT (ELECTROSURGICAL) ×2
ELECTRODE REM PT RTRN 9FT ADLT (ELECTROSURGICAL) ×1 IMPLANT
EXTRACTOR VACUUM KIWI (MISCELLANEOUS) IMPLANT
GAUZE SPONGE 4X4 12PLY STRL (GAUZE/BANDAGES/DRESSINGS) IMPLANT
GLOVE BIO SURGEON STRL SZ7 (GLOVE) ×6 IMPLANT
GLOVE INDICATOR 7.5 STRL GRN (GLOVE) ×6 IMPLANT
GOWN STRL REUS W/ TWL LRG LVL3 (GOWN DISPOSABLE) ×3 IMPLANT
GOWN STRL REUS W/TWL LRG LVL3 (GOWN DISPOSABLE) ×3
KIT PREVENA INCISION MGT20CM45 (CANNISTER) ×2 IMPLANT
NS IRRIG 1000ML POUR BTL (IV SOLUTION) ×2 IMPLANT
PACK C SECTION AR (MISCELLANEOUS) ×2 IMPLANT
PAD OB MATERNITY 4.3X12.25 (PERSONAL CARE ITEMS) ×4 IMPLANT
PAD PREP 24X41 OB/GYN DISP (PERSONAL CARE ITEMS) ×2 IMPLANT
PENCIL SMOKE ULTRAEVAC 22 CON (MISCELLANEOUS) ×2 IMPLANT
RETRACTOR TRAXI PANNICULUS (MISCELLANEOUS) ×1 IMPLANT
RETRACTOR WND ALEXIS-O 25 LRG (MISCELLANEOUS) IMPLANT
RTRCTR WOUND ALEXIS O 25CM LRG (MISCELLANEOUS)
SPONGE LAP 18X18 RF (DISPOSABLE) ×2 IMPLANT
STAPLER INSORB 30 2030 C-SECTI (MISCELLANEOUS) ×2 IMPLANT
STRIP CLOSURE SKIN 1/2X4 (GAUZE/BANDAGES/DRESSINGS) IMPLANT
SUT MNCRL AB 4-0 PS2 18 (SUTURE) ×2 IMPLANT
SUT PDS AB 1 TP1 96 (SUTURE) ×4 IMPLANT
SUT PLAIN 2 0 XLH (SUTURE) ×2 IMPLANT
SUT VIC AB 0 CTX 36 (SUTURE) ×1
SUT VIC AB 0 CTX36XBRD ANBCTRL (SUTURE) ×1 IMPLANT
SUT VIC AB 2-0 CT1 36 (SUTURE) ×2 IMPLANT
TRAXI PANNICULUS RETRACTOR (MISCELLANEOUS) ×1

## 2020-01-13 NOTE — Op Note (Signed)
Preoperative Diagnosis: 1) 29 y.o. G2P0010 at [redacted]w[redacted]d with fetal intolerance to labor 2) Insulin dependent type II diabetes 3) Superimposed preeclampsia 4) COVID positive 5) Obesity Body mass index is 36.94 kg/m.   Postoperative Diagnosis: 1) 29 y.o. [id number] at [redacted]w[redacted]d with fetal intolerance to labor 2) Insulin dependent type II diabetes 3) Superimposed preeclampsia 4) COVID positive 5) Obesity Body mass index is 36.94 kg/m. 6) Tight nuchal cord x 2  Operation Performed: Primary low transverse C-section via pfannenstiel skin incision  Indication: Fetal intolerance to labor, late decerlerations with loss of variability  Anesthesia: .General  Primary Surgeon: [name], MD  Assistant: [facility] surgical technologist  Preoperative Antibiotics: 2g ancef and 500mg  of azithromycin  Estimated Blood Loss: 816mL  IV Fluids: 2353mL  Drains or Tubes: Foley to gravity drainage, ON-Q catheter system (Single catheter)  Implants: none  Specimens Removed: none  Complications: none  Intraoperative Findings:  Normal tubes ovaries and uterus.  Delivery resulted in the birth of a liveborn female, APGAR (1 MIN): 3   APGAR (5 MINS): 9, weight pending  Patient Condition: stable  Procedure in Detail:  Patient was taken to the operating room positioned in the supine position, prepped and draped in the  Usual sterile fashion.  Prior to proceeding with the case a time out was performed.  After intubation the scalpel was used to score a pfannenstiel skin incision 2cm above the pubic symphysis, this carried down sharply to the the level of the rectus fascia.  The fascia was incised in the midline.  The fascia was extended using blunt dissection.   The midline was identified, the peritoneum was entered bluntly and expanded using manual tractions.  The uterus was noted to be in a none rotated position.  Next the bladder blade was placed retracting the bladder caudally.  A bladder flap was  not created.  A low transverse incision was scored on the lower uterine segment.  The hysterotomy was entered bluntly using the operators finger.  The hysterotomy incision was extended using manual traction.  The operators hand was placed within the hysterotomy position noting the fetus to be within the OA position.  The vertex was grasped, flexed, brought to the incision, and delivered a traumatically using fundal pressure.  The remainder of the body delivered with ease.  The infant was suctioned, cord was clamped and cut before handing off to the awaiting neonatologist.  The placenta was delivered using manual extraction.  The uterus was exteriorized, wiped clean of clots and debris using two moist laps.  The hysterotomy was closed using a two layer closure of 0 Vicryl, with the first being a running locked, the second a vertical imbricating.  The uterus was returned to the abdomen.  The peritoneal gutters were wiped clean of clots and debris using two moist laps.  The hysterotomy incision was re-inspected noted to be hemostatic.  The rectus muscles were inspected noted to be hemostatic.  The superior border of the rectus fascia was grasped with a Kocher clamp.  The ON-Q trocars were then placed 4cm above the superior border of the incision and tunneled subfascially.  The introducers were removed and the catheters were threaded through the sleeves after which the sleeves were removed.  The fascia was closed using a looped #1 PDS in a running fashion taking 1cm by 1cm bites.  The subcutaneous tissue was irrigated using warm saline, hemostasis achieved using the bovie.  The subcutaneous dead space was less than 3cm and was not closed.  The skin was closed using Insorb staples.  Attempt at placing Provena was unsuccessful secondary to inability to maintain vacuum.  One ON-Q catheter was removed secondary to inability of catheter to flush.  Sponge needle and instrument counts were corrects times two.  The patient  tolerated the procedure well and was taken to the recovery room in stable condition.

## 2020-01-13 NOTE — Progress Notes (Addendum)
Day of Surgery. 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  Had received Fentanyl and Percocet and ibuprofen for incisional pain as well as having ON Q pump. Would not cough and use the incentive spirometer due to pain. Drinking fluids, but had not eaten regular food.   Objective:  Blood pressure 128/83, pulse (!) 103, temperature 98.5 F (36.9 C), temperature source Oral, resp. rate (!) 21, height 5\' 5"  (1.651 m), weight 100.7 kg, last menstrual period 04/17/2019, SpO2 95 %,  BP range: Temp:  [97.8 F (36.6 C)-98.5 F (36.9 C)] 98.5 F (36.9 C) (02/17 1200) Pulse Rate:  [81-113] 103 (02/17 1345) Resp:  [10-33] 21 (02/17 1345) BP: (113-157)/(64-94) 128/83 (02/17 1200) SpO2:  [91 %-99 %] 95 % (02/17 1345)  QT was .470 this AM 4950 ml urine output over the last 24 hours, 4482 ml intake, 898 ml blood loss.  U/O 125-225 ml/hour  General: moaning with pain, texting on phone at times Heart: RRR without murmur Pulmonary: no increased work of breathing/ rhonchi bilaterally all fields Abdomen: non-distended, appropriately tender, fundus firm at level of umbilicus Incision: Honeycomb dressing C+D+I; ON Q with single catheter intact Extremities: SCDs on  Results for orders placed or performed during the hospital encounter of 01/11/20 (from the past 72 hour(s))  Respiratory Panel by RT PCR (Flu A&B, Covid) - Nasopharyngeal Swab     Status: Abnormal   Collection Time: 01/11/20  8:22 AM   Specimen: Nasopharyngeal Swab  Result Value Ref Range   SARS Coronavirus 2 by RT PCR POSITIVE (A) NEGATIVE    Comment: RESULT CALLED TO, READ BACK BY AND VERIFIED WITH: MINDY EDGE RN AT 01/13/20 ON 01/11/20 SNG (NOTE) SARS-CoV-2 target nucleic acids are DETECTED. SARS-CoV-2 RNA is generally detectable in upper respiratory specimens  during the  acute phase of infection. Positive results are indicative of the presence of the identified virus, but do not rule out bacterial infection or co-infection with other pathogens not detected by the test. Clinical correlation with patient history and other diagnostic information is necessary to determine patient infection status. The expected result is Negative. Fact Sheet for Patients:  01/13/20 Fact Sheet for Healthcare Providers: https://www.moore.com/ This test is not yet approved or cleared by the https://www.young.biz/ FDA and  has been authorized for detection and/or diagnosis of SARS-CoV-2 by FDA under an Emergency Use Authorization (EUA).  This EUA will remain in effect (meaning this test can be used)  for the duration of  the COVID-19 declaration under Section 564(b)(1) of the Act, 21 U.S.C. section 360bbb-3(b)(1), unless the authorization is terminated or revoked sooner.    Influenza A by PCR NEGATIVE NEGATIVE   Influenza B by PCR NEGATIVE NEGATIVE    Comment: (NOTE) The Xpert Xpress SARS-CoV-2/FLU/RSV assay is intended as an aid in  the diagnosis of influenza from Nasopharyngeal swab specimens and  should not be used as a sole basis for treatment. Nasal washings and  aspirates are unacceptable for Xpert Xpress SARS-CoV-2/FLU/RSV  testing. Fact Sheet for Patients: [place] Fact Sheet for Healthcare Providers: https://www.moore.com/ This test is not yet approved or cleared by the https://www.young.biz/ FDA and  has been authorized for detection and/or diagnosis of SARS-CoV-2 by  FDA under an Emergency Use Authorization (EUA). This EUA will remain  in effect (meaning this test can be used) for the duration  of the  Covid-19 declaration under Section 564(b)(1) of the Act, 21  U.S.C. section 360bbb-3(b)(1), unless the authorization is  terminated or revoked. Performed at [facility], [address] [place], [place] [address]   Protein / creatinine ratio, urine     Status: Abnormal   Collection Time: 01/11/20  8:22 AM  Result Value Ref Range   Creatinine, Urine 99 mg/dL   Total Protein, Urine 32 mg/dL    Comment: NO NORMAL RANGE ESTABLISHED FOR THIS TEST   Protein Creatinine Ratio 0.32 (H) 0.00 - 0.15 mg/mg[Cre]    Comment: Performed at [facility], [address] [place], [place] [address]  Glucose, capillary     Status: Abnormal   Collection Time: 01/11/20  8:57 AM  Result Value Ref Range   Glucose-Capillary 193 (H) 70 - 99 mg/dL  Type and screen [facility]     Status: None   Collection Time: 01/11/20  9:47 AM  Result Value Ref Range   ABO/RH(D) B POS    Antibody Screen NEG    Sample Expiration      01/14/2020,2359 Performed at [facility], [address] [place], [place] [address]   CBC     Status: Abnormal   Collection Time: 01/11/20  9:47 AM  Result Value Ref Range   WBC 13.9 (H) 4.0 - 10.5 K/uL   RBC 4.52 3.87 - 5.11 MIL/uL   Hemoglobin 12.6 12.0 - 15.0 g/dL   HCT 17.4 08.1 - 44.8 %   MCV 83.6 80.0 - 100.0 fL   MCH 27.9 26.0 - 34.0 pg   MCHC 33.3 30.0 - 36.0 g/dL   RDW 18.5 63.1 - 49.7 %   Platelets 312 150 - 400 K/uL   nRBC 0.0 0.0 - 0.2 %    Comment: Performed at [facility], [address] [place], [place] [address]  Comprehensive metabolic panel     Status: Abnormal   Collection Time: 01/11/20  9:47 AM  Result Value Ref Range   Sodium 134 (L) 135 - 145 mmol/L   Potassium 4.0 3.5 - 5.1 mmol/L   Chloride 105 98 - 111 mmol/L   CO2 19 (L) 22 - 32 mmol/L   Glucose, Bld 149 (H) 70 - 99 mg/dL   BUN 9 6 - 20 mg/dL   Creatinine, Ser 8.58 0.44 - 1.00 mg/dL   Calcium 9.0 8.9 - 85.0 mg/dL   Total Protein 6.2 (L) 6.5 - 8.1 g/dL   Albumin 2.7 (L) 3.5 - 5.0 g/dL   AST 18 15 - 41 U/L   ALT 19 0 - 44 U/L   Alkaline Phosphatase 95 38 - 126 U/L   Total Bilirubin 0.3 0.3 - 1.2 mg/dL   GFR  calc non Af Amer >60 >60 mL/min   GFR calc Af Amer >60 >60 mL/min   Anion gap 10 5 - 15    Comment: Performed at [facility], [address] [place], [place] [address]  RPR     Status: None   Collection Time: 01/11/20  9:47 AM  Result Value Ref Range   RPR Ser Ql NON REACTIVE NON REACTIVE    Comment: Performed at [facility], [address] [place], [place] [address]  Glucose, capillary     Status: Abnormal   Collection Time: 01/11/20 10:39 AM  Result Value Ref Range   Glucose-Capillary 126 (H) 70 - 99 mg/dL  Glucose, capillary     Status: Abnormal  Collection Time: 01/11/20  3:34 PM  Result Value Ref Range   Glucose-Capillary 117 (H) 70 - 99 mg/dL  Glucose, capillary     Status: Abnormal   Collection Time: 01/11/20  9:10 PM  Result Value Ref Range   Glucose-Capillary 118 (H) 70 - 99 mg/dL  Glucose, capillary     Status: Abnormal   Collection Time: 01/12/20  7:35 AM  Result Value Ref Range   Glucose-Capillary 142 (H) 70 - 99 mg/dL  Glucose, capillary     Status: Abnormal   Collection Time: 01/12/20 11:11 AM  Result Value Ref Range   Glucose-Capillary 143 (H) 70 - 99 mg/dL  Glucose, capillary     Status: Abnormal   Collection Time: 01/12/20 12:23 PM  Result Value Ref Range   Glucose-Capillary 142 (H) 70 - 99 mg/dL  Glucose, capillary     Status: Abnormal   Collection Time: 01/12/20  3:06 PM  Result Value Ref Range   Glucose-Capillary 153 (H) 70 - 99 mg/dL  Comprehensive metabolic panel     Status: Abnormal   Collection Time: 01/12/20  6:12 PM  Result Value Ref Range   Sodium 135 135 - 145 mmol/L   Potassium 4.1 3.5 - 5.1 mmol/L   Chloride 103 98 - 111 mmol/L   CO2 21 (L) 22 - 32 mmol/L   Glucose, Bld 122 (H) 70 - 99 mg/dL   BUN 12 6 - 20 mg/dL   Creatinine, Ser 0.54 0.44 - 1.00 mg/dL   Calcium 8.3 (L) 8.9 - 10.3 mg/dL   Total Protein 6.8 6.5 - 8.1 g/dL   Albumin 2.9 (L) 3.5 - 5.0 g/dL   AST 25 15 - 41 U/L   ALT 21 0 - 44 U/L   Alkaline  Phosphatase 107 38 - 126 U/L   Total Bilirubin 0.4 0.3 - 1.2 mg/dL   GFR calc non Af Amer >60 >60 mL/min   GFR calc Af Amer >60 >60 mL/min   Anion gap 11 5 - 15    Comment: Performed at [facility], [address] [place], [place] [address]  CBC     Status: Abnormal   Collection Time: 01/12/20  6:12 PM  Result Value Ref Range   WBC 12.7 (H) 4.0 - 10.5 K/uL   RBC 4.94 3.87 - 5.11 MIL/uL   Hemoglobin 13.7 12.0 - 15.0 g/dL   HCT 41.5 36.0 - 46.0 %   MCV 84.0 80.0 - 100.0 fL   MCH 27.7 26.0 - 34.0 pg   MCHC 33.0 30.0 - 36.0 g/dL   RDW 15.3 11.5 - 15.5 %   Platelets 329 150 - 400 K/uL   nRBC 0.0 0.0 - 0.2 %    Comment: Performed at [facility], [address] [place], [place] [address]  Glucose, capillary     Status: Abnormal   Collection Time: 01/12/20  6:52 PM  Result Value Ref Range   Glucose-Capillary 117 (H) 70 - 99 mg/dL  Glucose, capillary     Status: Abnormal   Collection Time: 01/12/20 10:59 PM  Result Value Ref Range   Glucose-Capillary 198 (H) 70 - 99 mg/dL  Glucose, capillary     Status: Abnormal   Collection Time: 01/13/20  1:09 AM  Result Value Ref Range   Glucose-Capillary 172 (H) 70 - 99 mg/dL  Glucose, capillary     Status: Abnormal   Collection Time: 01/13/20  3:25 AM  Result Value Ref Range   Glucose-Capillary 161 (H) 70 -  99 mg/dL  CBC     Status: Abnormal   Collection Time: 01/13/20  7:22 AM  Result Value Ref Range   WBC 14.2 (H) 4.0 - 10.5 K/uL   RBC 4.09 3.87 - 5.11 MIL/uL   Hemoglobin 11.3 (L) 12.0 - 15.0 g/dL   HCT 17.4 (L) 08.1 - 44.8 %   MCV 85.3 80.0 - 100.0 fL   MCH 27.6 26.0 - 34.0 pg   MCHC 32.4 30.0 - 36.0 g/dL   RDW 18.5 63.1 - 49.7 %   Platelets 284 150 - 400 K/uL   nRBC 0.0 0.0 - 0.2 %    Comment: Performed at [facility], [address] [place], [place] [address]  Glucose, capillary     Status: Abnormal   Collection Time: 01/13/20  8:08 AM  Result Value Ref Range   Glucose-Capillary 167 (H) 70 - 99  mg/dL  Glucose, capillary     Status: Abnormal   Collection Time: 01/13/20 12:05 PM  Result Value Ref Range   Glucose-Capillary 110 (H) 70 - 99 mg/dL     Assessment/Plan:   29 y.o. G2P1011 postoperativeday # 0 LTCS-stable vital signs Rhonchi-bilaterally. Probably from poor pulmonary toiletry. R/O pneumonia/ R/O Covid  Obtained CXR: DG Chest Port 1 View  Result Date: 01/13/2020 CLINICAL DATA:  29 year old female with history of bronchi in the lung bases today. EXAM: PORTABLE CHEST 1 VIEW COMPARISON:  No priors. FINDINGS: Lung volumes are normal. No consolidative airspace disease. No pleural effusions. No pneumothorax. No pulmonary nodule or mass noted. Pulmonary vasculature and the cardiomediastinal silhouette are within normal limits. IMPRESSION: No radiographic evidence of acute cardiopulmonary disease. Electronically Signed   By: Trudie Reed M.D.   On: 01/13/2020 12:28   Albuterol inhaler every 4 hours prn  Continue to encourage pulmonary toiletry  Pain control: Added Fentanyl 25 mgm q2 hours to Percocet and Motrin  Have resumed Baclfen  Plan on resuming Klonopin this afternoon  T2DM-when tolerating regular diet will change to checking CBGs 2 hr pp and at hs rather than every 4 hours And do correction sliding scale.  CHTN with superimposed preeclampsia with severe features  Mild to normal blood pressures postpartum  Continue Clonidine 0.3 mgm daily  Treat severe range blood pressures  Plan to discontinue magnesium after 24 hours postop-around 2 AM tomorrow  4) Baby in NICU receiving O2 via oxyhood and for glucose regulation  NICU nurses took pictures of baby and video for parents  5) Prolonged QT -plan on discontinuing Pitocin after 12 hours if bleeding is mild.   6) DVT PPX: SCDs currently. Lovenox to start tomorrow  Farrel Conners, CNM

## 2020-01-13 NOTE — Progress Notes (Signed)
RN set up incentive spirometer and began education about importance of use following surgery, as well as superimposed importance d/t COVID+ status. Pt. and SO interrupted RN, stating that using an incentive spirometer had caused "asthma attacks since childhood." RN encouraged pt. to try to use the IS while RN was at the bedside with rescue inhaler available, but pt. refused.  Pt. currently presents with bilateral rhonchi upon auscultation with O2 in the mid-90's at the time of the note.

## 2020-01-13 NOTE — Progress Notes (Signed)
RN requested pt. and SO to please wear their masks while RN in the room performing pt. care, as pt. is symptomatic and COVID+. SO placed his mask and pt.'s mask on, but pulled them below their noses. RN requested that masks be pulled up to cover their noses, as both pt. and SO are actively coughing, but they refused. They were educated on the importance of masks needing to cover both mouth and nose, especially when symptoms are present, for the personal safety of the RN taking care of the pt. and anyone else that may enter the room, but they still refused.

## 2020-01-13 NOTE — Transfer of Care (Signed)
Immediate Anesthesia Transfer of Care Note  Patient: Joy [name]  Procedure(s) Performed: CESAREAN SECTION (N/A Abdomen)  Patient Location: PACU  Anesthesia Type:General  Level of Consciousness: oriented and patient cooperative  Airway & Oxygen Therapy: Patient Spontanous Breathing and Patient connected to face mask oxygen  Post-op Assessment: Report given to RN and Post -op Vital signs reviewed and stable  Post vital signs: Reviewed and stable  Last Vitals:  Vitals Value Taken Time  BP    Temp    Pulse    Resp    SpO2      Last Pain:  Vitals:   01/13/20 0325  TempSrc:   PainSc: (P) 0-No pain      Patients Stated Pain Goal: 0 (01/12/20 1900)  Complications: No apparent anesthesia complications

## 2020-01-13 NOTE — Progress Notes (Signed)
Subjective:    Objective:   Vitals: Blood pressure (!) 153/68, pulse 99, temperature 98.4 F (36.9 C), temperature source Oral, resp. rate 17, height 5\' 5"  (1.651 m), weight 100.7 kg, last menstrual period 04/17/2019, SpO2 91 %. General: NAD Abdomen: gravid Cervical Exam:  Dilation: Fingertip Effacement (%): 70 Cervical Position: Posterior Station: -3 Presentation: Vertex Exam by:: Braelynn Benning MD  FHT: 160, minimal to moderate, no accels, intermittent late decelerations Toco: none picking up   Results for orders placed or performed during the hospital encounter of 01/11/20 (from the past 24 hour(s))  Glucose, capillary     Status: Abnormal   Collection Time: 01/12/20  7:35 AM  Result Value Ref Range   Glucose-Capillary 142 (H) 70 - 99 mg/dL  Glucose, capillary     Status: Abnormal   Collection Time: 01/12/20 11:11 AM  Result Value Ref Range   Glucose-Capillary 143 (H) 70 - 99 mg/dL  Glucose, capillary     Status: Abnormal   Collection Time: 01/12/20 12:23 PM  Result Value Ref Range   Glucose-Capillary 142 (H) 70 - 99 mg/dL  Glucose, capillary     Status: Abnormal   Collection Time: 01/12/20  3:06 PM  Result Value Ref Range   Glucose-Capillary 153 (H) 70 - 99 mg/dL  Comprehensive metabolic panel     Status: Abnormal   Collection Time: 01/12/20  6:12 PM  Result Value Ref Range   Sodium 135 135 - 145 mmol/L   Potassium 4.1 3.5 - 5.1 mmol/L   Chloride 103 98 - 111 mmol/L   CO2 21 (L) 22 - 32 mmol/L   Glucose, Bld 122 (H) 70 - 99 mg/dL   BUN 12 6 - 20 mg/dL   Creatinine, Ser 01/14/20 0.44 - 1.00 mg/dL   Calcium 8.3 (L) 8.9 - 10.3 mg/dL   Total Protein 6.8 6.5 - 8.1 g/dL   Albumin 2.9 (L) 3.5 - 5.0 g/dL   AST 25 15 - 41 U/L   ALT 21 0 - 44 U/L   Alkaline Phosphatase 107 38 - 126 U/L   Total Bilirubin 0.4 0.3 - 1.2 mg/dL   GFR calc non Af Amer >60 >60 mL/min   GFR calc Af Amer >60 >60 mL/min   Anion gap 11 5 - 15  CBC     Status: Abnormal   Collection Time: 01/12/20   6:12 PM  Result Value Ref Range   WBC 12.7 (H) 4.0 - 10.5 K/uL   RBC 4.94 3.87 - 5.11 MIL/uL   Hemoglobin 13.7 12.0 - 15.0 g/dL   HCT 01/14/20 70.9 - 62.8 %   MCV 84.0 80.0 - 100.0 fL   MCH 27.7 26.0 - 34.0 pg   MCHC 33.0 30.0 - 36.0 g/dL   RDW 36.6 29.4 - 76.5 %   Platelets 329 150 - 400 K/uL   nRBC 0.0 0.0 - 0.2 %  Glucose, capillary     Status: Abnormal   Collection Time: 01/12/20  6:52 PM  Result Value Ref Range   Glucose-Capillary 117 (H) 70 - 99 mg/dL  Glucose, capillary     Status: Abnormal   Collection Time: 01/12/20 10:59 PM  Result Value Ref Range   Glucose-Capillary 198 (H) 70 - 99 mg/dL    Assessment:   29 y.o. G2P0010 [redacted]w[redacted]d IOL for insulin dependent DMII, superimposed preeclampsia, COVID positive, now with fetal intolerance to labor  Plan:   1) Fetus - cat II tracing, still intermittent subtle late deceleration if with discontinuation of pitocin,  now with fetal tachycardia to 160's and loss of variability.  Patient remains resistant to position changes secondary to back spasms.  The patient was counseled regarding risk and benefits to proceeding with Cesarean section to expedite delivery.  Risk of cesarean section were discussed including risk of bleeding and need for potential intraoperative or postoperative blood transfusion with a rate of approximately 5% quoted for all Cesarean sections, risk of injury to adjacent organs including but not limited to bowl and bladder, the need for additional surgical procedures to address such injuries, and the risk of infection.  The risk of continued attempts at vaginal delivery include but are note limited to worsening fetal or maternal status.  After consideration of options the patient is amenable to proceed with primary cesarean section for delivery.  - 2g ancef and 500mg  IV azithromycin - Will plan on placing provena at conclusion of the case - stat CBG and cover with additional sliding scale given delivery imminent   [name], MD, [place] 01/13/2020, 12:53 AM

## 2020-01-13 NOTE — Progress Notes (Signed)
Inpatient Diabetes Program Recommendations  AACE/ADA: New Consensus Statement on Inpatient Glycemic Control (2015)  Target Ranges:  Prepandial:   less than 140 mg/dL      Peak postprandial:   less than 180 mg/dL (1-2 hours)      Critically ill patients:  140 - 180 mg/dL   Lab Results  Component Value Date   GLUCAP 110 (H) 01/13/2020   HGBA1C 6.9 (H) 12/10/2019    Review of Glycemic Control Results for Joy [name], MCPHEARSON (MRN [id number]) as of 01/13/2020 13:38  Ref. Range 01/13/2020 03:25 01/13/2020 08:08 01/13/2020 12:05  Glucose-Capillary Latest Ref Range: 70 - 99 mg/dL 987 (H) 215 (H) 872 (H)   Diabetes history: DM 2 (? LADA) Outpatient Diabetes medications:   Lantus 38 units daily, Humalog 16 units tid with meals Current orders for Inpatient glycemic control:  Novolog 0-14 units q 4 hours  Novolog 8 units tid with meals Lantus 45 units q HS  Inpatient Diabetes Program Recommendations:    Note delivery overnight.  Insulin needs will likely decrease.   -Please consider reducing Lantus to 30 units q HS.  -Reduce Novolog meal coverage to 5 units tid with meals.  -D/C Novolog 0-14 units q 4 hours and add Novolog sensitive tid with meals and HS (Glycemic control order set).  Thanks,  Beryl Meager, RN, BC-ADM Inpatient Diabetes Coordinator Pager [phone] (8a-5p)

## 2020-01-13 NOTE — Discharge Summary (Signed)
Obstetric Discharge Summary Reason for Admission: induction of labor, insulin dependent DMII, superimposed preeclampsia, COVID positive Prenatal Procedures: none Intrapartum Procedures: cesarean: low cervical, transverse Postpartum Procedures: none Complications-Operative and Postpartum: none Hemoglobin  Date Value Ref Range Status  01/14/2020 9.9 (L) 12.0 - 15.0 g/dL Final  12/10/2019 13.0 11.1 - 15.9 g/dL Final  07/20/2019 13.4  Final  07/20/2019 13.4  Final   HCT  Date Value Ref Range Status  01/14/2020 30.9 (L) 36.0 - 46.0 % Final  07/20/2019 39 29 - 41 Final  07/20/2019 39 29 - 41 Final   Hematocrit  Date Value Ref Range Status  12/10/2019 37.9 34.0 - 46.6 % Final    Physical Exam:  General: alert, appears stated age and no distress Lochia: appropriate Uterine Fundus: firm Incision: healing well DVT Evaluation: No evidence of DVT seen on physical exam.  Discharge Diagnoses: Term Pregnancy-delivered  Discharge Information: Date: 01/16/2020 Activity: pelvic rest Diet: diabetic carb modified Allergies as of 01/16/2020      Reactions   Liraglutide    Drug induced acute pancreatitis.    Flexeril [cyclobenzaprine]    Mood changes   Gabapentin Other (See Comments)   Mood changes   Latex Hives   Manganese Trace Metal Additives [manganese] Rash   Other Rash      Medication List    STOP taking these medications   insulin glargine 100 UNIT/ML injection Commonly known as: LANTUS Replaced by: insulin glargine 100 unit/mL Sopn   metFORMIN 1000 MG tablet Commonly known as: GLUCOPHAGE     TAKE these medications   albuterol 108 (90 Base) MCG/ACT inhaler Commonly known as: VENTOLIN HFA INHALE 2 PUFFS BY MOUTH EVERY 4 HOURS AS NEEDED FOR WHEEZING OR SHORTNESS OF BREATH   baclofen 10 MG tablet Commonly known as: LIORESAL Take 10 mg by mouth 3 (three) times daily.   cholecalciferol 25 MCG (1000 UNIT) tablet Commonly known as: VITAMIN D3 Take 1,000 Units by mouth  daily.   clonazePAM 1 MG tablet Commonly known as: KLONOPIN Take 1 tablet (1 mg total) by mouth 2 (two) times daily.   cloNIDine 0.3 MG tablet Commonly known as: CATAPRES Take 1 tablet (0.3 mg total) by mouth daily.   diphenhydrAMINE 25 mg capsule Commonly known as: BENADRYL Take 25 mg by mouth at bedtime.   enoxaparin 40 MG/0.4ML injection Commonly known as: LOVENOX Inject 0.4 mLs (40 mg total) into the skin daily for 28 days. Start taking on: January 17, 2020   ibuprofen 600 MG tablet Commonly known as: ADVIL Take 1 tablet (600 mg total) by mouth every 6 (six) hours as needed.   insulin glargine 100 unit/mL Sopn Commonly known as: LANTUS Inject 0.26 mLs (26 Units total) into the skin at bedtime. Replaces: insulin glargine 100 UNIT/ML injection   insulin lispro 100 UNIT/ML KwikPen Commonly known as: HUMALOG Inject 0.06 mLs (6 Units total) into the skin 3 (three) times daily before meals. What changed:   how much to take  Another medication with the same name was removed. Continue taking this medication, and follow the directions you see here.   oxyCODONE-acetaminophen 5-325 MG tablet Commonly known as: PERCOCET/ROXICET Take 1 tablet by mouth every 4 (four) hours as needed for moderate pain or severe pain.   Pen Needles 31G X 6 MM Misc 1 each by Does not apply route 4 (four) times daily.   prenatal multivitamin Tabs tablet Take 1 tablet by mouth daily at 12 noon.   trimethobenzamide 300 MG capsule Commonly known  as: TIGAN Take 1 capsule (300 mg total) by mouth 3 (three) times daily.            Discharge Care Instructions  (From admission, onward)         Start     Ordered   01/16/20 0000  Discharge wound care:    Comments: You may apply a light dressing for minor discharge from the incision or to keep waistbands of clothing from rubbing.  You may also have been discharge with a clear dressing in which case this will be removed at your postoperative  clinic visit.  You may shower, use soap on your incision.  Avoid baths or soaking the incision in the first 6 weeks following your surgery.[name]   01/16/20 1105          Condition: stable Discharge to: home Follow-up Information    Vena Austria, MD Follow up on 01/25/2020.   Specialty: Obstetrics and Gynecology Why: For wound re-check (patient will need to report respiratory symptom improvement and be afebrile for 24-hrs of tylenol in order to be seen in person) Contact information: [address] [place] [address] [phone]           Newborn Data: Live born female  Birth Weight: 6 lb 5.6 oz (2880 g) APGAR: 3, 9  Newborn Delivery   Birth date/time: 01/13/2019 02:07:00 Delivery type: C-Section, Low Transverse Trial of labor: Yes C-section categorization: Primary      Home with mother.  Vena Austria 01/16/2020, 11:05 AM

## 2020-01-13 NOTE — Progress Notes (Signed)
Called into room for patient assessment, significant other answered phone after numerous rings and reported that patient was not doing well right now. CM introduced self and role, significant other reports that he can try to get her to talk. Discussed that this was not urgent and CM would call back later this evening or in the morning.

## 2020-01-13 NOTE — Progress Notes (Signed)
Subjective:  Still crying, non-verbal when asked questions, whispers to husband and his him relay answers  Objective:   Vitals: Blood pressure (!) 153/68, pulse 99, temperature 98.4 F (36.9 C), temperature source Oral, resp. rate 17, height 5\' 5"  (1.651 m), weight 100.7 kg, last menstrual period 04/17/2019, SpO2 91 %. General: Crying Abdomen: soft, non-tender Cervical Exam:  Dilation: Fingertip Effacement (%): 70 Cervical Position: Posterior Station: -3 Presentation: Vertex Exam by:: Tasmia Blumer MD Slightly more midline, better engagement of fetal head appreciated, still very intolerant of exam  FHT: 150, moderate, accelerations absent, intermittent late deceleration Toco: q59min  Results for orders placed or performed during the hospital encounter of 01/11/20 (from the past 24 hour(s))  Glucose, capillary     Status: Abnormal   Collection Time: 01/12/20  7:35 AM  Result Value Ref Range   Glucose-Capillary 142 (H) 70 - 99 mg/dL  Glucose, capillary     Status: Abnormal   Collection Time: 01/12/20 11:11 AM  Result Value Ref Range   Glucose-Capillary 143 (H) 70 - 99 mg/dL  Glucose, capillary     Status: Abnormal   Collection Time: 01/12/20 12:23 PM  Result Value Ref Range   Glucose-Capillary 142 (H) 70 - 99 mg/dL  Glucose, capillary     Status: Abnormal   Collection Time: 01/12/20  3:06 PM  Result Value Ref Range   Glucose-Capillary 153 (H) 70 - 99 mg/dL  Comprehensive metabolic panel     Status: Abnormal   Collection Time: 01/12/20  6:12 PM  Result Value Ref Range   Sodium 135 135 - 145 mmol/L   Potassium 4.1 3.5 - 5.1 mmol/L   Chloride 103 98 - 111 mmol/L   CO2 21 (L) 22 - 32 mmol/L   Glucose, Bld 122 (H) 70 - 99 mg/dL   BUN 12 6 - 20 mg/dL   Creatinine, Ser 01/14/20 0.44 - 1.00 mg/dL   Calcium 8.3 (L) 8.9 - 10.3 mg/dL   Total Protein 6.8 6.5 - 8.1 g/dL   Albumin 2.9 (L) 3.5 - 5.0 g/dL   AST 25 15 - 41 U/L   ALT 21 0 - 44 U/L   Alkaline Phosphatase 107 38 - 126 U/L    Total Bilirubin 0.4 0.3 - 1.2 mg/dL   GFR calc non Af Amer >60 >60 mL/min   GFR calc Af Amer >60 >60 mL/min   Anion gap 11 5 - 15  CBC     Status: Abnormal   Collection Time: 01/12/20  6:12 PM  Result Value Ref Range   WBC 12.7 (H) 4.0 - 10.5 K/uL   RBC 4.94 3.87 - 5.11 MIL/uL   Hemoglobin 13.7 12.0 - 15.0 g/dL   HCT 01/14/20 59.5 - 63.8 %   MCV 84.0 80.0 - 100.0 fL   MCH 27.7 26.0 - 34.0 pg   MCHC 33.0 30.0 - 36.0 g/dL   RDW 75.6 43.3 - 29.5 %   Platelets 329 150 - 400 K/uL   nRBC 0.0 0.0 - 0.2 %  Glucose, capillary     Status: Abnormal   Collection Time: 01/12/20  6:52 PM  Result Value Ref Range   Glucose-Capillary 117 (H) 70 - 99 mg/dL  Glucose, capillary     Status: Abnormal   Collection Time: 01/12/20 10:59 PM  Result Value Ref Range   Glucose-Capillary 198 (H) 70 - 99 mg/dL    Assessment:   29 y.o. G2P0010 [redacted]w[redacted]d IOL insulin dependent DMII, superimposed preeclampsia  Plan:   1) Labor -  Pitocin of at present, patient refusing/intolerant of position changes.  Discussed with OR team.  If unable to get late decelerations to resolve will need to proceed with 1LTCS for delivery  2) Fetus - cat II tracing   [name], MD, [place], [facility] 01/13/2020, 12:17 AM

## 2020-01-13 NOTE — Anesthesia Procedure Notes (Signed)
Procedure Name: Intubation Date/Time: 01/13/2020 2:06 AM Performed by: Waldo Laine, CRNA Pre-anesthesia Checklist: Patient identified, Patient being monitored, Timeout performed, Emergency Drugs available and Suction available Patient Re-evaluated:Patient Re-evaluated prior to induction Oxygen Delivery Method: Circle system utilized Preoxygenation: Pre-oxygenation with 100% oxygen Induction Type: IV induction, Rapid sequence and Cricoid Pressure applied Laryngoscope Size: 3 and McGraph Grade View: Grade I Tube type: Oral Tube size: 7.0 mm Number of attempts: 1 Airway Equipment and Method: Stylet Placement Confirmation: ETT inserted through vocal cords under direct vision,  positive ETCO2 and breath sounds checked- equal and bilateral Secured at: 21 cm Tube secured with: Tape Dental Injury: Teeth and Oropharynx as per pre-operative assessment

## 2020-01-14 LAB — CBC
HCT: 30.9 % — ABNORMAL LOW (ref 36.0–46.0)
Hemoglobin: 9.9 g/dL — ABNORMAL LOW (ref 12.0–15.0)
MCH: 27.6 pg (ref 26.0–34.0)
MCHC: 32 g/dL (ref 30.0–36.0)
MCV: 86.1 fL (ref 80.0–100.0)
Platelets: 265 10*3/uL (ref 150–400)
RBC: 3.59 MIL/uL — ABNORMAL LOW (ref 3.87–5.11)
RDW: 15.7 % — ABNORMAL HIGH (ref 11.5–15.5)
WBC: 11 10*3/uL — ABNORMAL HIGH (ref 4.0–10.5)
nRBC: 0 % (ref 0.0–0.2)

## 2020-01-14 LAB — GLUCOSE, CAPILLARY
Glucose-Capillary: 99 mg/dL (ref 70–99)
Glucose-Capillary: 131 mg/dL — ABNORMAL HIGH (ref 70–99)
Glucose-Capillary: 159 mg/dL — ABNORMAL HIGH (ref 70–99)
Glucose-Capillary: 115 mg/dL — ABNORMAL HIGH (ref 70–99)
Glucose-Capillary: 88 mg/dL (ref 70–99)

## 2020-01-14 NOTE — Progress Notes (Signed)
Admit Date: 01/11/2020 Today's Date: 01/14/2020  Subjective: Postpartum Day 1: Cesarean Delivery Patient reports incisional pain, tolerating PO and no problems voiding.    Objective: Vital signs in last 24 hours: Temp:  [98 F (36.7 C)-98.8 F (37.1 C)] 98.7 F (37.1 C) (02/18 0828) Pulse Rate:  [81-122] 97 (02/18 0616) Resp:  [10-31] 16 (02/18 0828) BP: (111-157)/(58-91) 135/82 (02/18 0616) SpO2:  [91 %-99 %] 95 % (02/18 0201)  Physical Exam:  General: alert, cooperative and no distress  Chest CTA B Heart Reg r/r, no m/g/r Abd NT, ND Lochia: appropriate Uterine Fundus: firm Incision: healing well, no significant drainage, no dehiscence, no significant erythema DVT Evaluation: No evidence of DVT seen on physical exam. Negative Homan's sign. No cords or calf tenderness. No significant calf/ankle edema.  Recent Labs    01/12/20 1812 01/13/20 0722  HGB 13.7 11.3*  HCT 41.5 34.9*    Assessment/Plan: Status post Cesarean section. Doing well postoperatively.  Continue current care. Discussed Bottle feeding, monitor for breast changes and for mastitis Discussed birth control, she mentions condoms and spermicide; counseled on importance of contraception due to CS and need to wait >18 mos as well as high risk nature for future pregnancies, options for Nexplanon discussed and encouraged Monitor for s/sx Covid (asymptomatic Pos testing), stable today Infant in SCN doing well, on bottle feeds.  Blood sugar low.  Anticipate rooming in tomorrow perhaps. HTN, well controlled, Clonidine DM, Insulin adjusted per rec's, BS improving Lovenox as prophylaxis Monitor bleeding  Letitia Libra 01/14/2020, 9:24 AM

## 2020-01-14 NOTE — Progress Notes (Signed)
I spoke with Lanice Schwab, a case coordinator, about resources for housing and low cost healthcare for the patient. I expressed my concerned about the patient's lack of resources upon discharge. Pamphlets were given to the patient and her significant other.  I spoke with the lactation consultant about getting WIC set up before discharge.  The patient is aware of this plan and has no questions at this time.

## 2020-01-14 NOTE — Progress Notes (Signed)
[place] [employer] as well as Teacher, English as a foreign language given to bedside nurse Corrie Dandy to give to patient when she is back in room. Discussed any further concerns and encouraged her to reach out if anything else came up.

## 2020-01-14 NOTE — Lactation Note (Signed)
This note was copied from a baby's chart. Lactation Consultation Note  Patient Name: Joy [name] [id number] Date: 01/14/2020   Mom is in isolation LDR5 tested positive for COVID, with baby in isolation unit in SCN. RN contacted [facility] for assistance with [facility] referral. RN caring for mom expressed concerns over baby and resources available to ensure baby has what it needs after discharge. Family is currently living in a hotel, and reports moving frequently. Referral was faxed over to [facility] with mom's information, and LC spoke with [facility] Nutritionist who is following up to ensure benefits for mother/baby dyad. Referral is also being made on the [facility] platform for care management needs by the [facility] department.  Mom had mentioned briefly to RN caring for her that she wanted to breastfeed only while in the hospital. RN planned to follow-up with mom's desires and will contact LC if mom decides she wants to/needs assistance.   Maternal Data    Feeding Feeding Type: Formula Nipple Type: Slow - flow  LATCH Score                   Interventions    Lactation Tools Discussed/Used Tools: Bottle   Consult Status      Joy [name] 01/14/2020, 2:42 PM

## 2020-01-14 NOTE — Anesthesia Post-op Follow-up Note (Signed)
  Anesthesia Pain Follow-up Note  Patient: Joy [name]  Day #: 1  Date of Follow-up: 01/14/2020 Time: 7:53 AM  Last Vitals:  Vitals:   01/14/20 0323 01/14/20 0616  BP: 117/63 135/82  Pulse: 94 97  Resp: 16 16  Temp:  36.8 C  SpO2:      Level of Consciousness: alert  Pain: mild   Side Effects:None  Catheter Site Exam:clean, dry  Anti-Coag Meds (From admission, onward)   Start     Dose/Rate Route Frequency Ordered Stop   01/14/20 0800  enoxaparin (LOVENOX) injection 40 mg     40 mg Subcutaneous Every 24 hours 01/13/20 0440         Plan: D/C from anesthesia care at surgeon's request  Rosanne Gutting

## 2020-01-14 NOTE — Progress Notes (Signed)
Discussed POC and concerns with social work. WIC, housing, food stamps,transportation, and mental health barriers all discussed. Social work is currently working on a Higher education careers adviser for [employer] after discharge.

## 2020-01-14 NOTE — Progress Notes (Signed)
RNCM assessed patient by phone with assistance of bedside nurse. Consults received for both mother and baby due to mother with significant mental health history as well referral for WIC. Bedside nurse reports that she will contact lactation to assist with setting up WIC.  Speaking with patient on phone she reports to this CM that she and her fiance had been living up with a friend prior to coming in but they had to leave that home and there plan is to return to the [facility] and when her SSI check comes in they will seek out a more permanent residence.  She reports that she does not currently have a primary MD and ob/gyn has been prescribing her medications, she also reports feeling like she is stable with her current status. Discussed that she will need to get set up with a PCP and is open to this CM bringing her resources for [place].  Patient reports that they currently don't have transportation and that they are planning on calling a friend to pick them up. Discussed that due to +Covid diagnosis setting up other transportation would be difficult.  Again discussed with patient bringing some resources up for bedside nurse to bring in to her which she expresses appreciation for.   No further needs identified at this time but RNCM will remain available for any further needs.           

## 2020-01-14 NOTE — Anesthesia Postprocedure Evaluation (Signed)
Anesthesia Post Note  Patient: Joy [name]  Procedure(s) Performed: CESAREAN SECTION (N/A Abdomen)  Patient location during evaluation: Mother Baby Anesthesia Type: Epidural Level of consciousness: oriented and awake and alert Pain management: pain level controlled Vital Signs Assessment: post-procedure vital signs reviewed and stable Respiratory status: spontaneous breathing and respiratory function stable Cardiovascular status: blood pressure returned to baseline and stable Postop Assessment: no headache, no backache, no apparent nausea or vomiting and able to ambulate Anesthetic complications: no     Last Vitals:  Vitals:   01/14/20 0323 01/14/20 0616  BP: 117/63 135/82  Pulse: 94 97  Resp: 16 16  Temp:  36.8 C  SpO2:      Last Pain:  Vitals:   01/14/20 0616  TempSrc: Oral  PainSc: 8                  Malachi Pro C

## 2020-01-14 NOTE — Progress Notes (Signed)
I walked in to find Joy [name] bent over on her bed crying. Her significant other was attempting to comfort her. I asked what was wrong, and she would not look at me or answer. Her significant other, Casimiro Needle, stated that she was having one of her "PTSD episodes". He said that she would best communicate by texting him and him reading the messages to me during this time.  I asked if she was in pain, and she finally nodded no. I asked if there was anything I could do and encouraged her that a plan was in place for her care. Casimiro Needle, her significant other, assured me that this was a normal occurrence for her and that there was nothing I needed to do. I helped the patient back into the bed and encouraged rest, decreased stimulation, and to call me for any needs or if she needed to talk.  Social work was made aware. I checked back a few minutes later, and the patient was resting.

## 2020-01-15 LAB — GLUCOSE, CAPILLARY
Glucose-Capillary: 80 mg/dL (ref 70–99)
Glucose-Capillary: 122 mg/dL — ABNORMAL HIGH (ref 70–99)
Glucose-Capillary: 79 mg/dL (ref 70–99)
Glucose-Capillary: 106 mg/dL — ABNORMAL HIGH (ref 70–99)
Glucose-Capillary: 109 mg/dL — ABNORMAL HIGH (ref 70–99)
Glucose-Capillary: 131 mg/dL — ABNORMAL HIGH (ref 70–99)

## 2020-01-15 MED ORDER — INSULIN GLARGINE 100 UNIT/ML ~~LOC~~ SOLN
26.0000 [IU] | Freq: Every day | SUBCUTANEOUS | Status: DC
  Administered 2020-01-15: 26 [IU] via SUBCUTANEOUS
  Filled 2020-01-15 (×2): qty 0.26

## 2020-01-15 NOTE — Progress Notes (Signed)
Inpatient Diabetes Program Recommendations  AACE/ADA: New Consensus Statement on Inpatient Glycemic Control (2015)  Target Ranges:  Prepandial:   less than 140 mg/dL      Peak postprandial:   less than 180 mg/dL (1-2 hours)      Critically ill patients:  140 - 180 mg/dL   Lab Results  Component Value Date   GLUCAP 80 01/15/2020   HGBA1C 6.9 (H) 12/10/2019    Review of Glycemic Control Results for Joy [name], Joy [name] (MRN [id number]) as of 01/15/2020 09:48  Ref. 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  Consider reducing Lantus to 26 units q HS.   Thanks,  Beryl Meager, RN, BC-ADM Inpatient Diabetes Coordinator Pager [phone] (8a-5p)

## 2020-01-15 NOTE — Progress Notes (Signed)
RNCM reached out to CPS to discuss case. Spoke with Joy [name] and relayed all details as they are available. Discussed that although there is not once specific alarming concern with patient's situation there are a lot of small/worrisome factors. Joy [name] relayed that he would type up case and review with his supervisor and give this CM a call back to update on whether they plan to pursue investigation.

## 2020-01-15 NOTE — Progress Notes (Signed)
RNCM received call from [facility] with Pregnancy Care Management through [facility]. [name] reports that she has been following patient for the past couple of months and plans to continue to follow after discharge. Discussed case with [name] and did inform her that this CM had contacted [facility] and discussed a CPS referral but that they had not returned call yet. [name] assured this CM that she would continue to follow. Provided [name] with bedside nurse name and phone number so that she may inquire about a couple of items she knows patient needs.

## 2020-01-15 NOTE — Progress Notes (Addendum)
Obstetric Postpartum/PostOperative Daily Progress Note Subjective:  29 y.o. G2P1011 post-operative day # 2 status post primary cesarean delivery.  She is ambulating, is tolerating po, is voiding spontaneously.  Her pain is well controlled on PO pain medications and On Q pump. Her lochia is less than menses.   Medications SCHEDULED MEDICATIONS  . baclofen  10 mg Oral TID  . clonazePAM  0.5 mg Oral BID  . cloNIDine  0.3 mg Oral Daily  . enoxaparin (LOVENOX) injection  40 mg Subcutaneous Q24H  . ibuprofen  800 mg Oral Q8H  . insulin aspart  0-14 Units Subcutaneous QID  . insulin aspart  5 Units Subcutaneous TID WC  . insulin glargine  30 Units Subcutaneous QHS  . prenatal multivitamin  1 tablet Oral Q1200  . senna-docusate  2 tablet Oral Q24H  . simethicone  80 mg Oral TID PC  . simethicone  80 mg Oral Q24H    MEDICATION INFUSIONS  . bupivacaine 0.25 % ON-Q pump DUAL CATH 400 mL    . lactated ringers Stopped (01/14/20 0222)  . lactated ringers Stopped (01/14/20 0221)    PRN MEDICATIONS  albuterol, coconut oil, witch hazel-glycerin **AND** dibucaine, diphenhydrAMINE, fentaNYL (SUBLIMAZE) injection, labetalol **AND** labetalol **AND** labetalol **AND** hydrALAZINE **AND** Measure blood pressure, menthol-cetylpyridinium, oxyCODONE-acetaminophen, simethicone    Objective:   Vitals:   01/14/20 2234 01/15/20 0042 01/15/20 0416 01/15/20 0754  BP: (!) 154/84 (!) 105/55 127/64 132/73  Pulse: 100 91 89 94  Resp:  19 17 18   Temp:  99 F (37.2 C) 98.6 F (37 C) 99.1 F (37.3 C)  TempSrc:  Oral Oral Oral  SpO2: 97% 97% 98% 99%  Weight:      Height:        Current Vital Signs 24h Vital Sign Ranges  T 99.1 F (37.3 C) Temp  Avg: 98.7 F (37.1 C)  Min: 98.2 F (36.8 C)  Max: 99.1 F (37.3 C)  BP 132/73 BP  Min: 105/55  Max: 154/84  HR 94 Pulse  Avg: 96.2  Min: 89  Max: 107  RR 18 Resp  Avg: 17.6  Min: 16  Max: 19  SaO2 99 % Room Air SpO2  Avg: 97.8 %  Min: 97 %  Max: 99 %       24  Hour I/O Current Shift I/O  Time Ins Outs No intake/output data recorded. No intake/output data recorded.  General: NAD Pulmonary: no increased work of breathing Abdomen: non-distended, non-tender, fundus firm at level of umbilicus Inc: Clean/dry/intact, scant sero/sang drainage at On Q pump Extremities: no edema, no erythema, no tenderness  Labs:  Recent Labs  Lab 01/12/20 1812 01/13/20 0722 01/14/20 0949  WBC 12.7* 14.2* 11.0*  HGB 13.7 11.3* 9.9*  HCT 41.5 34.9* 30.9*  PLT 329 284 265     Assessment:   29 y.o. G2P1011 postoperative day # 2 status post primary cesarean section  Plan:  1) Acute blood loss anemia - hemodynamically stable and asymptomatic - po ferrous sulfate  2) B POS / Rubella Immune (08/24 0000)/ Varicella Unknown (patient reports hx chicken pox)  3) TDAP status given antepartum  4) Formula feeding/Contraception = plans condoms/spermicide. She may consider LARC- Nexplanon or IUD   5) IDDM: continue current care with BG checks and insulin AC/correction post prandial, HS. Better control since adjustments yesterday  6) HTN well controlled: Clonidine  7) Lovenox prophylaxis  8) Monitor for s/s Covid: anticipate rooming in with newborn today  9) Disposition: continue current care  Social work has given Clinical cytogeneticist for care/housing/etc after discharge   Tresea Mall, [place] 01/15/2020 9:52 AM

## 2020-01-16 LAB — GLUCOSE, CAPILLARY
Glucose-Capillary: 77 mg/dL (ref 70–99)
Glucose-Capillary: 111 mg/dL — ABNORMAL HIGH (ref 70–99)
Glucose-Capillary: 109 mg/dL — ABNORMAL HIGH (ref 70–99)
Glucose-Capillary: 139 mg/dL — ABNORMAL HIGH (ref 70–99)

## 2020-01-16 MED ORDER — ENOXAPARIN SODIUM 40 MG/0.4ML ~~LOC~~ SOLN: 40.0000 mg | SUBCUTANEOUS | 0 refills | Status: DC

## 2020-01-16 MED ORDER — IBUPROFEN 600 MG PO TABS: 600.0000 mg | ORAL_TABLET | Freq: Four times a day (QID) | ORAL | 3 refills | Status: DC | PRN

## 2020-01-16 MED ORDER — INSULIN LISPRO (1 UNIT DIAL) 100 UNIT/ML (KWIKPEN): 6.0000 [IU] | PEN_INJECTOR | Freq: Three times a day (TID) | SUBCUTANEOUS | 11 refills | Status: DC

## 2020-01-16 MED ORDER — INSULIN GLARGINE 100 UNITS/ML SOLOSTAR PEN: 26.0000 [IU] | PEN_INJECTOR | Freq: Every day | SUBCUTANEOUS | 0 refills | Status: DC

## 2020-01-16 MED ORDER — OXYCODONE-ACETAMINOPHEN 5-325 MG PO TABS: 1.0000 | ORAL_TABLET | ORAL | 0 refills | Status: DC | PRN

## 2020-01-16 NOTE — Progress Notes (Signed)
EMS contacted multiple supervisors to clarify payment and authorization of services. Family declined transport due to dad being unable to ride, apparently hotel is in his name, mom will be unable to carry items, baby, etc. EMS not authorized to transport FOB. Family has elected to take an [place]. Informed pt and FOB of risks to [facility] driver and others by not disclosing information of Covid positive status at time of transport. Pt appreciative of our input but stated, "we are going to wear our masks, and we have to go together".

## 2020-01-16 NOTE — Clinical Social Work Note (Signed)
Clinical Social Work Assessment  Patient Details  Name: Joy [name] MRN: [id number] Date of Birth: 05/22/1991  Date of referral:  01/16/20               Reason for consult:  Care Management Concerns, Mental Health Concerns, Housing Concerns/Homelessness                Permission sought to share information with:  Case Manager, Magazine features editor, PCP Permission granted to share information::  Yes, Verbal Permission Granted   Housing/Transportation Living arrangements for the past 2 months:    Source of Information:    Patient Interpreter Needed:    Criminal Activity/Legal Involvement Pertinent to Current Situation/Hospitalization:  No - Comment as needed Significant Relationships:  Community Support(Pregnancy Care Management through [facility]) Lives with:  Significant Other Do you feel safe going back to the place where you live?    Need for family participation in patient care:  No (Coment)  Care giving concerns:  Patient discharging to [facility] with her newborn. Patient w/hx of depression admitted to mild symptoms of depression.  Stated that she has Pharmacist, hospital and BH urgent numbers on her phone.  She noted that she has her fiance and her fiance's father who will help her once she is discharge.   Newborn's name: Joy [name]   Social Worker assessment / plan:  Patient will discharge to [facility], [address], [place], [place] with her newborn. [place] Child Protective Service informed of discharge.    Employment status:  Unemployed Health and safety inspector:  Medicaid In [place] PT Recommendations:    Information / Referral to community resources:  [facility] CPS report placed)  Patient/Family's Response to care:  Patient responded well to plan and understands that CPS will contact her once discharge.   Patient/Family's Understanding of and Emotional Response to Diagnosis, Current Treatment, and Prognosis:  Patient demonstrated good  understanding of plan.   Emotional Assessment Appearance:  Other (Comment Required(Virtual visit.  Not able to asses) Attitude/Demeanor/Rapport:  (cooperative) Affect (typically observed):    Orientation:  Oriented to Self, Oriented to Place, Oriented to  Time, Oriented to Situation Alcohol / Substance use:    Psych involvement (Current and /or in the community):  No (Comment)  Discharge Needs  Concerns to be addressed:  Homelessness(Resolved.  Pt will be discharged to [facility].  CPS notified.) Readmission within the last 30 days:  No Current discharge risk:  Homeless Barriers to Discharge:  Barriers Resolved(Patient plans to discharged to [facility])   Kandis Cocking I [place], LCSW 01/16/2020, 1:52 PM

## 2020-01-16 NOTE — Progress Notes (Signed)
Family informed by SW that EMS unit will provide transportation through [facility] unit # 28. Supervisor Armed forces operational officer approved. (This RN spoke with [place]ons from [facility]). Awaiting transport.

## 2020-01-16 NOTE — Progress Notes (Signed)
Spoke with SW, unable to find transport due to Covid positive status. EMS was not approved for transport.  Per pt, no one is available to pick them up. Will notify MD.

## 2020-01-16 NOTE — Progress Notes (Signed)
EMS here, request info on patient. Will be unable to transport FOB due to COVID restrictions. EMS in room to inform the family.

## 2020-01-16 NOTE — Progress Notes (Signed)
Pt reports they have been able to acquire transportation. Will discharge as planned.

## 2020-01-16 NOTE — Plan of Care (Signed)
Reviewed D/C instructions with pt and family. Pt verbalized understanding of teaching. Discharged to home via W/C. Pt to schedule f/u appt.  

## 2020-01-28 ENCOUNTER — Telehealth: Payer: Self-pay

## 2020-01-28 NOTE — Telephone Encounter (Signed)
Pt calling; delivered by unplanned C/S 2/17; is it okay to take the dressing off now instead of at appt 3/9?  [phone]

## 2020-01-28 NOTE — Telephone Encounter (Signed)
yes

## 2020-01-28 NOTE — Telephone Encounter (Signed)
Advise

## 2020-01-29 ENCOUNTER — Other Ambulatory Visit: Payer: Medicaid Other

## 2020-01-29 NOTE — Telephone Encounter (Signed)
Pt aware.

## 2020-02-01 ENCOUNTER — Inpatient Hospital Stay: Admit: 2020-02-01 | Payer: Self-pay

## 2020-02-02 ENCOUNTER — Ambulatory Visit (INDEPENDENT_AMBULATORY_CARE_PROVIDER_SITE_OTHER): Payer: Medicaid Other | Admitting: Obstetrics and Gynecology

## 2020-02-02 ENCOUNTER — Encounter: Payer: Self-pay | Admitting: Obstetrics and Gynecology

## 2020-02-02 ENCOUNTER — Other Ambulatory Visit: Payer: Self-pay

## 2020-02-02 VITALS — BP 144/82 | Wt 210.0 lb

## 2020-02-02 DIAGNOSIS — Z4889 Encounter for other specified surgical aftercare: Secondary | ICD-10-CM

## 2020-02-02 DIAGNOSIS — B354 Tinea corporis: Secondary | ICD-10-CM

## 2020-02-02 MED ORDER — BACLOFEN 10 MG PO TABS: 10.0000 mg | ORAL_TABLET | Freq: Three times a day (TID) | ORAL | 0 refills | Status: DC

## 2020-02-02 MED ORDER — OXYCODONE-ACETAMINOPHEN 5-325 MG PO TABS: 1.0000 | ORAL_TABLET | ORAL | 0 refills | Status: DC | PRN

## 2020-02-02 MED ORDER — NYSTATIN 100000 UNIT/GM EX CREA: 1.0000 "application " | TOPICAL_CREAM | Freq: Two times a day (BID) | CUTANEOUS | 1 refills | Status: DC

## 2020-02-02 NOTE — Progress Notes (Signed)
Postoperative Follow-up Patient presents post op from 1LTCS 2weeks ago for fetal intolerance to labor.  Subjective: Patient reports marked improvement in her preop symptoms. Eating a regular diet without difficulty. Pain is controlled with current analgesics. Medications being used: narcotic analgesics including oxycodone/acetaminophen (Percocet, Tylox).  Activity: normal activities of daily living.  Objective: Blood pressure (!) 144/82, weight 210 lb (95.3 kg), last menstrual period 04/17/2019, not currently breastfeeding.  General: NAD Pulmonary: no increased work of breathing Abdomen: soft, non-tender, non-distended, incision(s) D/C/I, some erythema beneath panis consistent with candida Extremities: no edema Neurologic: normal gait    Admission on 01/11/2020, Discharged on 01/16/2020  Component Date Value Ref Range Status  . SARS Coronavirus 2 by RT PCR 01/11/2020 POSITIVE* NEGATIVE Final   Comment: RESULT CALLED TO, READ BACK BY AND VERIFIED WITH: MINDY EDGE RN AT 0175 ON 01/11/20 SNG (NOTE) SARS-CoV-2 target nucleic acids are DETECTED. SARS-CoV-2 RNA is generally detectable in upper respiratory specimens  during the acute phase of infection. Positive results are indicative of the presence of the identified virus, but do not rule out bacterial infection or co-infection with other pathogens not detected by the test. Clinical correlation with patient history and other diagnostic information is necessary to determine patient infection status. The expected result is Negative. Fact Sheet for Patients:  https://www.moore.com/ Fact Sheet for Healthcare Providers: https://www.young.biz/ This test is not yet approved or cleared by the [place] FDA and  has been authorized for detection and/or diagnosis of SARS-CoV-2 by FDA under an Emergency Use Authorization (EUA).  This EUA will remain in effect (meaning this test can be used)                           for the duration of  the COVID-19 declaration under Section 564(b)(1) of the Act, 21 U.S.C. section 360bbb-3(b)(1), unless the authorization is terminated or revoked sooner.   . Influenza A by PCR 01/11/2020 NEGATIVE  NEGATIVE Final  . Influenza B by PCR 01/11/2020 NEGATIVE  NEGATIVE Final   Comment: (NOTE) The Xpert Xpress SARS-CoV-2/FLU/RSV assay is intended as an aid in  the diagnosis of influenza from Nasopharyngeal swab specimens and  should not be used as a sole basis for treatment. Nasal washings and  aspirates are unacceptable for Xpert Xpress SARS-CoV-2/FLU/RSV  testing. Fact Sheet for Patients: https://www.moore.com/ Fact Sheet for Healthcare Providers: https://www.young.biz/ This test is not yet approved or cleared by the [place] FDA and  has been authorized for detection and/or diagnosis of SARS-CoV-2 by  FDA under an Emergency Use Authorization (EUA). This EUA will remain  in effect (meaning this test can be used) for the duration of the  Covid-19 declaration under Section 564(b)(1) of the Act, 21  U.S.C. section 360bbb-3(b)(1), unless the authorization is  terminated or revoked. Performed at [facility], [address] [place], [place] [address]   . Glucose-Capillary 01/11/2020 193* 70 - 99 mg/dL Final  . ABO/RH(D) 52/77/8242 B POS   Final  . Antibody Screen 01/11/2020 NEG   Final  . Sample Expiration 01/11/2020    Final                   Value:01/14/2020,2359 Performed at [facility], [address] [place], [place] [address]   . WBC 01/11/2020 13.9* 4.0 - 10.5 K/uL Final  . RBC 01/11/2020 4.52  3.87 - 5.11 MIL/uL Final  . Hemoglobin 01/11/2020 12.6  12.0 - 15.0  g/dL Final  . HCT 56/43/3295 37.8  36.0 - 46.0 % Final  . MCV 01/11/2020 83.6  80.0 - 100.0 fL Final  . MCH 01/11/2020 27.9  26.0 - 34.0 pg Final  . MCHC 01/11/2020 33.3  30.0 - 36.0 g/dL Final  . RDW 18/84/1660  15.5  11.5 - 15.5 % Final  . Platelets 01/11/2020 312  150 - 400 K/uL Final  . nRBC 01/11/2020 0.0  0.0 - 0.2 % Final   Performed at [facility], [address], [place], [place] [address]  . Sodium 01/11/2020 134* 135 - 145 mmol/L Final  . Potassium 01/11/2020 4.0  3.5 - 5.1 mmol/L Final  . Chloride 01/11/2020 105  98 - 111 mmol/L Final  . CO2 01/11/2020 19* 22 - 32 mmol/L Final  . Glucose, Bld 01/11/2020 149* 70 - 99 mg/dL Final  . BUN 12/04/3233 9  6 - 20 mg/dL Final  . Creatinine, Ser 01/11/2020 0.55  0.44 - 1.00 mg/dL Final  . Calcium 57/32/2025 9.0  8.9 - 10.3 mg/dL Final  . Total Protein 01/11/2020 6.2* 6.5 - 8.1 g/dL Final  . Albumin 42/70/6237 2.7* 3.5 - 5.0 g/dL Final  . AST 62/83/1517 18  15 - 41 U/L Final  . ALT 01/11/2020 19  0 - 44 U/L Final  . Alkaline Phosphatase 01/11/2020 95  38 - 126 U/L Final  . Total Bilirubin 01/11/2020 0.3  0.3 - 1.2 mg/dL Final  . GFR calc non Af Amer 01/11/2020 >60  >60 mL/min Final  . GFR calc Af Amer 01/11/2020 >60  >60 mL/min Final  . Anion gap 01/11/2020 10  5 - 15 Final   Performed at [facility], [address], [place], [place] [address]  . Creatinine, Urine 01/11/2020 99  mg/dL Final  . Total Protein, Urine 01/11/2020 32  mg/dL Final   NO NORMAL RANGE ESTABLISHED FOR THIS TEST  . Protein Creatinine Ratio 01/11/2020 0.32* 0.00 - 0.15 mg/mg[Cre] Final   Performed at [facility], [address] [place], [place] [address]  . RPR Ser Ql 01/11/2020 NON REACTIVE  NON REACTIVE Final   Performed at [facility], [address], [place], [place] [address]  . Glucose-Capillary 01/11/2020 126* 70 - 99 mg/dL Final  . Glucose-Capillary 01/11/2020 117* 70 - 99 mg/dL Final  . Glucose-Capillary 01/11/2020 118* 70 - 99 mg/dL Final  . Glucose-Capillary 01/12/2020 142* 70 - 99 mg/dL Final  . Glucose-Capillary 01/12/2020 143* 70 - 99 mg/dL Final  . Glucose-Capillary 01/12/2020 142* 70 - 99 mg/dL Final  .  Glucose-Capillary 01/12/2020 153* 70 - 99 mg/dL Final  . Sodium 54/62/7035 135  135 - 145 mmol/L Final  . Potassium 01/12/2020 4.1  3.5 - 5.1 mmol/L Final  . Chloride 01/12/2020 103  98 - 111 mmol/L Final  . CO2 01/12/2020 21* 22 - 32 mmol/L Final  . Glucose, Bld 01/12/2020 122* 70 - 99 mg/dL Final  . BUN 00/93/8182 12  6 - 20 mg/dL Final  . Creatinine, Ser 01/12/2020 0.54  0.44 - 1.00 mg/dL Final  . Calcium 99/37/1696 8.3* 8.9 - 10.3 mg/dL Final  . Total Protein 01/12/2020 6.8  6.5 - 8.1 g/dL Final  . Albumin 78/93/8101 2.9* 3.5 - 5.0 g/dL Final  . AST 75/08/2584 25  15 - 41 U/L Final  . ALT 01/12/2020 21  0 - 44 U/L Final  . Alkaline Phosphatase 01/12/2020 107  38 - 126 U/L Final  . Total Bilirubin 01/12/2020 0.4  0.3 - 1.2 mg/dL Final  . GFR calc non Af Amer 01/12/2020 >60  >60 mL/min Final  . GFR calc Af Amer 01/12/2020 >60  >60 mL/min Final  . Anion gap 01/12/2020 11  5 - 15 Final   Performed at [facility], [address], [place], [place] [address]  . WBC 01/12/2020 12.7* 4.0 - 10.5 K/uL Final  . RBC 01/12/2020 4.94  3.87 - 5.11 MIL/uL Final  . Hemoglobin 01/12/2020 13.7  12.0 - 15.0 g/dL Final  . HCT 65/78/4696 41.5  36.0 - 46.0 % Final  . MCV 01/12/2020 84.0  80.0 - 100.0 fL Final  . MCH 01/12/2020 27.7  26.0 - 34.0 pg Final  . MCHC 01/12/2020 33.0  30.0 - 36.0 g/dL Final  . RDW 29/52/8413 15.3  11.5 - 15.5 % Final  . Platelets 01/12/2020 329  150 - 400 K/uL Final  . nRBC 01/12/2020 0.0  0.0 - 0.2 % Final   Performed at [facility], [address], [place], [place] [address]  . Glucose-Capillary 01/12/2020 117* 70 - 99 mg/dL Final  . Glucose-Capillary 01/12/2020 198* 70 - 99 mg/dL Final  . Glucose-Capillary 01/13/2020 172* 70 - 99 mg/dL Final  . WBC 02/72/5366 14.2* 4.0 - 10.5 K/uL Final  . RBC 01/13/2020 4.09  3.87 - 5.11 MIL/uL Final  . Hemoglobin 01/13/2020 11.3* 12.0 - 15.0 g/dL Final  . HCT 44/01/4741 34.9* 36.0 - 46.0 % Final  . MCV 01/13/2020  85.3  80.0 - 100.0 fL Final  . MCH 01/13/2020 27.6  26.0 - 34.0 pg Final  . MCHC 01/13/2020 32.4  30.0 - 36.0 g/dL Final  . RDW 59/56/3875 15.2  11.5 - 15.5 % Final  . Platelets 01/13/2020 284  150 - 400 K/uL Final  . nRBC 01/13/2020 0.0  0.0 - 0.2 % Final   Performed at [facility], [address] [place], [place] [address]  . Glucose-Capillary 01/13/2020 167* 70 - 99 mg/dL Final  . Glucose-Capillary 01/13/2020 161* 70 - 99 mg/dL Final  . Glucose-Capillary 01/13/2020 110* 70 - 99 mg/dL Final  . Glucose-Capillary 01/13/2020 174* 70 - 99 mg/dL Final  . Glucose-Capillary 01/13/2020 219* 70 - 99 mg/dL Final  . Glucose-Capillary 01/13/2020 171* 70 - 99 mg/dL Final  . Glucose-Capillary 01/14/2020 99  70 - 99 mg/dL Final  . Glucose-Capillary 01/14/2020 131* 70 - 99 mg/dL Final  . WBC 95/18/8416 11.0* 4.0 - 10.5 K/uL Final  . RBC 01/14/2020 3.59* 3.87 - 5.11 MIL/uL Final  . Hemoglobin 01/14/2020 9.9* 12.0 - 15.0 g/dL Final  . HCT 60/63/0160 30.9* 36.0 - 46.0 % Final  . MCV 01/14/2020 86.1  80.0 - 100.0 fL Final  . MCH 01/14/2020 27.6  26.0 - 34.0 pg Final  . MCHC 01/14/2020 32.0  30.0 - 36.0 g/dL Final  . RDW 10/93/2355 15.7* 11.5 - 15.5 % Final  . Platelets 01/14/2020 265  150 - 400 K/uL Final  . nRBC 01/14/2020 0.0  0.0 - 0.2 % Final   Performed at [facility], [address] [place], [place] [address]  . Glucose-Capillary 01/14/2020 159* 70 - 99 mg/dL Final  . Glucose-Capillary 01/14/2020 115* 70 - 99 mg/dL Final  . Glucose-Capillary 01/14/2020 88  70 - 99 mg/dL Final  . Glucose-Capillary 01/15/2020 80  70 - 99 mg/dL Final  . Glucose-Capillary 01/15/2020 122* 70 - 99 mg/dL Final  . Glucose-Capillary 01/15/2020 79  70 - 99 mg/dL Final  . Glucose-Capillary 01/15/2020 106* 70 -  99 mg/dL Final  . Glucose-Capillary 01/15/2020 109* 70 - 99 mg/dL Final  . Glucose-Capillary 01/15/2020 131* 70 - 99 mg/dL Final  . Glucose-Capillary 01/16/2020 77  70 - 99 mg/dL Final  .  Glucose-Capillary 01/16/2020 111* 70 - 99 mg/dL Final  . Glucose-Capillary 01/16/2020 109* 70 - 99 mg/dL Final  . Glucose-Capillary 01/16/2020 139* 70 - 99 mg/dL Final    Assessment: 29 y.o. s/p 1LTCS stable  Plan: Patient has done well after surgery with no apparent complications.  I have discussed the post-operative course to date, and the expected progress moving forward.  The patient understands what complications to be concerned about.  I will see the patient in routine follow up, or sooner if needed.    Activity plan: No heavy lifting.  Nystatin tinea corporis Refill percocet and baclofen   No follow-ups on file.   [name], MD, [facility], [facility] 02/02/2020, 1:51 PM

## 2020-02-05 ENCOUNTER — Other Ambulatory Visit: Payer: Self-pay | Admitting: Obstetrics and Gynecology

## 2020-02-05 ENCOUNTER — Other Ambulatory Visit: Payer: Self-pay | Admitting: Obstetrics & Gynecology

## 2020-02-05 MED ORDER — AMOXICILLIN-POT CLAVULANATE 875-125 MG PO TABS: 1.0000 | ORAL_TABLET | Freq: Two times a day (BID) | ORAL | Status: DC

## 2020-02-05 NOTE — Telephone Encounter (Signed)
Please see pt msg 

## 2020-02-24 ENCOUNTER — Other Ambulatory Visit: Payer: Self-pay | Admitting: Obstetrics and Gynecology

## 2020-03-01 ENCOUNTER — Other Ambulatory Visit: Payer: Self-pay | Admitting: Obstetrics and Gynecology

## 2020-03-07 ENCOUNTER — Encounter: Payer: Self-pay | Admitting: Obstetrics and Gynecology

## 2020-03-07 ENCOUNTER — Ambulatory Visit (INDEPENDENT_AMBULATORY_CARE_PROVIDER_SITE_OTHER): Payer: Medicaid Other | Admitting: Obstetrics and Gynecology

## 2020-03-07 ENCOUNTER — Other Ambulatory Visit: Payer: Self-pay

## 2020-03-07 DIAGNOSIS — K429 Umbilical hernia without obstruction or gangrene: Secondary | ICD-10-CM

## 2020-03-07 DIAGNOSIS — Z1332 Encounter for screening for maternal depression: Secondary | ICD-10-CM | POA: Diagnosis not present

## 2020-03-07 DIAGNOSIS — K449 Diaphragmatic hernia without obstruction or gangrene: Secondary | ICD-10-CM
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
## 2020-03-10 ENCOUNTER — Other Ambulatory Visit: Payer: Self-pay | Admitting: Obstetrics and Gynecology

## 2020-03-10 NOTE — Telephone Encounter (Signed)
Advise

## 2020-03-14 ENCOUNTER — Encounter: Payer: Self-pay | Admitting: Surgery

## 2020-03-14 ENCOUNTER — Other Ambulatory Visit: Payer: Self-pay

## 2020-03-14 ENCOUNTER — Ambulatory Visit (INDEPENDENT_AMBULATORY_CARE_PROVIDER_SITE_OTHER): Payer: Medicaid Other | Admitting: Surgery

## 2020-03-14 VITALS — BP 153/110 | HR 116 | Temp 97.5°F | Resp 14 | Ht 65.0 in | Wt 211.6 lb

## 2020-03-14 DIAGNOSIS — N939 Abnormal uterine and vaginal bleeding, unspecified: Secondary | ICD-10-CM

## 2020-03-14 DIAGNOSIS — R1084 Generalized abdominal pain: Secondary | ICD-10-CM | POA: Diagnosis not present

## 2020-03-14 DIAGNOSIS — R1110 Vomiting, unspecified: Secondary | ICD-10-CM | POA: Diagnosis not present

## 2020-03-14 NOTE — Patient Instructions (Addendum)
We will get you scheduled for a CT scan of your abdomen and pelvis.  -You are scheduled for a CT abdomen/pelvis with contrast at [facility] on 03/23/20 at 9:00 am. You will need to to arrive there by 8:45 am and have nothing to eat or drink for 4 hours prior. You will need to pick up a prep kit before this appointment.    We will refer you to GI for your stomach and bowel issues. They will call you to schedule this.   We have order some lab tests for you. These will be done at [facility] laboratory. You may go anytime, enter in through the [place] entrance for this.   We will see you back here in 1 month.

## 2020-03-14 NOTE — Progress Notes (Signed)
Surgical Consultation  03/14/2020  Joy [name] is an 29 y.o. female.   Chief Complaint  Patient presents with  . New Patient (Initial Visit)    Hernia     HPI:  Is of 29 year old female seen in consultation at the request of Dr. Bonney Aid for ventral hernia and hiatal hernia.  She does have a complex history complete consistent  chronic seizure disorder, irritable bowel syndrome, chronic back pain, diabetes as well as prolonged QT syndrome , PCOS and Asperger's. She apparently takes Tigan for her chronic nausea.  He tells me that she has been diagnosed with both hiatal hernia and umbilical hernia for the last 6 years but during pregnancy apparently the umbilical hernia became more symptomatic.  She experiences intermittent pain in the abdomen that is diffuse sometimes crampy in nature and moderate intensity.  She does have chronic nausea.  She also has some chronic diarrhea. He does report chronic reflux symptoms and significant acid and retrosternal chest pain.  Past Medical History:  Diagnosis Date  . Anemia   . Anxiety   . Asperger's syndrome   . Asthma   . Chronic back pain   . Colon polyps   . Diabetes mellitus without complication (HCC)   . GERD (gastroesophageal reflux disease)   . Headache   . History of IBS   . History of seizures as a child   . Hyperlipidemia   . Long Q-T syndrome   . Mental disorder    Depression. past suicide attempt  . PCOS (polycystic ovarian syndrome)   . Seizures (HCC)   . Single umbilical artery   . Tooth decay     Past Surgical History:  Procedure Laterality Date  . CESAREAN SECTION N/A 01/13/2020   Procedure: CESAREAN SECTION;  Surgeon: Vena Austria, MD;  Location: [facility] ORS;  Service: Obstetrics;  Laterality: N/A;  . COLONOSCOPY  age 74  . TONSILLECTOMY     tonsils, tubes, adnoids    Family History  Problem Relation Age of Onset  . Cancer Mother   . COPD Mother   . Von Willebrand disease Mother   . Thyroid cancer  Mother   . Diabetes Mother   . Multiple sclerosis Mother   . Heart disease Father   . Supraventricular tachycardia Brother   . Multiple sclerosis Maternal Grandmother   . Parkinson's disease Maternal Grandmother   . Asthma Maternal Grandmother   . Heart disease Maternal Grandmother   . Alzheimer's disease Maternal Grandfather     Social History:  reports that she has been smoking cigarettes. She has a 2.50 pack-year smoking history. She has never used smokeless tobacco. She reports previous drug use. She reports that she does not drink alcohol.  Allergies:  Allergies  Allergen Reactions  . Liraglutide     Drug induced acute pancreatitis.   [name] [Cyclobenzaprine]     Mood changes  . Gabapentin Other (See Comments)    Mood changes  . Latex Hives  . Manganese Trace Metal Additives [Manganese] Rash  . Other Rash    Medications reviewed.     ROS Full ROS performed and is otherwise negative other than what is stated in the HPI    BP (!) 153/110   Pulse (!) 116   Temp (!) 97.5 F (36.4 C)   Resp 14   Ht 5\' 5"  (1.651 m)   Wt 211 lb 9.6 oz (96 kg)   LMP 03/07/2020   SpO2 98%   BMI 35.21 kg/m   Physical  Exam Vitals and nursing note reviewed. Exam conducted with a chaperone present.  Constitutional:      General: She is not in acute distress.    Appearance: Normal appearance. She is normal weight.  Eyes:     General: No scleral icterus.       Right eye: No discharge.        Left eye: No discharge.  Cardiovascular:     Rate and Rhythm: Normal rate and regular rhythm.     Heart sounds: No murmur.  Pulmonary:     Effort: Pulmonary effort is normal. No respiratory distress.     Breath sounds: Normal breath sounds. No stridor. No wheezing.  Abdominal:     General: Abdomen is flat. There is no distension.     Palpations: There is no mass.     Tenderness: There is abdominal tenderness.     Hernia: A hernia is present.     Comments: Diffuse abdominal  tenderness.  More tenderness in the epigastric and umbilical area I palpate her hernia but given her body habitus and her tenderness the exam is limited.  She does have a well-healed Pfannenstiel scar  Musculoskeletal:     Cervical back: Normal range of motion and neck supple. No rigidity or tenderness.  Lymphadenopathy:     Cervical: No cervical adenopathy.  Skin:    General: Skin is warm and dry.     Capillary Refill: Capillary refill takes less than 2 seconds.  Neurological:     General: No focal deficit present.     Mental Status: She is alert and oriented to person, place, and time.  Psychiatric:        Mood and Affect: Mood normal.        Behavior: Behavior normal.        Thought Content: Thought content normal.        Judgment: Judgment normal.       Assessment/Plan: 1. Generalized abdominal pain  I am  unable to fully determine where this is coming from.  Differential will include a ventral hernia that I am able to palpate on physical exam.  In order to delineate the anatomy and also assess  the hiatal hernia I will order a CT scan of the abdomen and pelvis. As far as her heavy menses and easy bruising I will start with some basic laboratory values to include a CBC, CMP and PT and PTT.  She does have a family history of von Willebrand's.  If she continues to have clinical bleeding may need to refer to hematology. Another component of her diffuse abdominal pain  Is that it  might be related to irritable bowel syndrome.  She does have also significant reflux issues and chronic nausea.  I will also make a referral to GI since might be helpful to also evaluate her GI tract endoscopically. No need for urgent surgical intervention at this time This note that I spent over 50 minutes in this encounter with greater than 50% spent in coordination and counseling of her care  Caroleen Hamman, MD [facility]

## 2020-03-23 ENCOUNTER — Other Ambulatory Visit: Payer: Self-pay

## 2020-03-23 ENCOUNTER — Telehealth: Payer: Self-pay

## 2020-03-23 ENCOUNTER — Ambulatory Visit
Admission: RE | Admit: 2020-03-23 | Discharge: 2020-03-23 | Disposition: A | Discharge status: Home / Self Care | Payer: Medicaid Other | Source: Ambulatory Visit | Attending: Surgery | Admitting: Surgery

## 2020-03-23 ENCOUNTER — Other Ambulatory Visit
Admission: RE | Admit: 2020-03-23 | Discharge: 2020-03-23 | Disposition: A | Discharge status: Home / Self Care | Payer: Medicaid Other | Source: Ambulatory Visit | Attending: Surgery | Admitting: Surgery

## 2020-03-23 DIAGNOSIS — R1084 Generalized abdominal pain: Secondary | ICD-10-CM

## 2020-03-23 DIAGNOSIS — N939 Abnormal uterine and vaginal bleeding, unspecified: Secondary | ICD-10-CM

## 2020-03-23 HISTORY — DX: Essential (primary) hypertension: I10

## 2020-03-23 LAB — CBC WITH DIFFERENTIAL/PLATELET
Abs Immature Granulocytes: 0.03 10*3/uL (ref 0.00–0.07)
Basophils Absolute: 0 10*3/uL (ref 0.0–0.1)
Basophils Relative: 0 %
Eosinophils Absolute: 0.2 10*3/uL (ref 0.0–0.5)
Eosinophils Relative: 2 %
HCT: 38.4 % (ref 36.0–46.0)
Hemoglobin: 12.4 g/dL (ref 12.0–15.0)
Immature Granulocytes: 0 %
Lymphocytes Relative: 23 %
Lymphs Abs: 1.8 10*3/uL (ref 0.7–4.0)
MCH: 25.6 pg — ABNORMAL LOW (ref 26.0–34.0)
MCHC: 32.3 g/dL (ref 30.0–36.0)
MCV: 79.2 fL — ABNORMAL LOW (ref 80.0–100.0)
Monocytes Absolute: 0.3 10*3/uL (ref 0.1–1.0)
Monocytes Relative: 4 %
Neutro Abs: 5.5 10*3/uL (ref 1.7–7.7)
Neutrophils Relative %: 71 %
Platelets: 290 10*3/uL (ref 150–400)
RBC: 4.85 MIL/uL (ref 3.87–5.11)
RDW: 14.6 % (ref 11.5–15.5)
WBC: 7.9 10*3/uL (ref 4.0–10.5)
nRBC: 0 % (ref 0.0–0.2)

## 2020-03-23 LAB — COMPREHENSIVE METABOLIC PANEL
ALT: 59 U/L — ABNORMAL HIGH (ref 0–44)
AST: 39 U/L (ref 15–41)
Albumin: 3.7 g/dL (ref 3.5–5.0)
Alkaline Phosphatase: 89 U/L (ref 38–126)
Anion gap: 8 (ref 5–15)
BUN: 9 mg/dL (ref 6–20)
CO2: 24 mmol/L (ref 22–32)
Calcium: 8.5 mg/dL — ABNORMAL LOW (ref 8.9–10.3)
Chloride: 101 mmol/L (ref 98–111)
Creatinine, Ser: 0.58 mg/dL (ref 0.44–1.00)
GFR calc Af Amer: >60 mL/min (ref >60)
GFR calc non Af Amer: >60 mL/min (ref >60)
Glucose, Bld: 213 mg/dL — ABNORMAL HIGH (ref 70–99)
Potassium: 3.9 mmol/L (ref 3.5–5.1)
Sodium: 133 mmol/L — ABNORMAL LOW (ref 135–145)
Total Bilirubin: 0.5 mg/dL (ref 0.3–1.2)
Total Protein: 7.2 g/dL (ref 6.5–8.1)

## 2020-03-23 LAB — PROTIME-INR
INR: 0.9 (ref 0.8–1.2)
Prothrombin Time: 11.7 seconds (ref 11.4–15.2)

## 2020-03-23 LAB — POCT I-STAT CREATININE: Creatinine, Ser: 0.6 mg/dL (ref 0.44–1.00)

## 2020-03-23 LAB — APTT: aPTT: 25 seconds (ref 24–36)

## 2020-03-23 MED ORDER — IOHEXOL 300 MG/ML  SOLN
100.0000 mL | Freq: Once | INTRAMUSCULAR | Status: AC | PRN
  Administered 2020-03-23: 100 mL via INTRAVENOUS

## 2020-03-23 NOTE — Telephone Encounter (Signed)
Notified patient as instructed, patient pleased. Discussed follow-up appointments, patient agrees  

## 2020-03-23 NOTE — Telephone Encounter (Signed)
-----   Message from Leafy Ro, MD sent at 03/23/2020  2:54 PM EDT ----- Please let her know that we did see a ventral hernia on CT but no other alarming findings. Small benign liver tumor that I am not concerned about ----- Message ----- From: Interface, Rad Results In Sent: 03/23/2020   2:49 PM EDT To: Leafy Ro, MD

## 2020-04-08 ENCOUNTER — Telehealth: Payer: Self-pay

## 2020-04-08 NOTE — Telephone Encounter (Signed)
Pt mom reports she has an apt w/[facility] 05/09/20 for 6 wk. She has her first family physician apt 04/28/20 since giving birth. She has an abscess and family MD will not rx anything until she is seen. Requesting ABX rx. She switched pharmacies to [address]. MV#[phone]

## 2020-04-08 NOTE — Telephone Encounter (Signed)
Spoke w/pt to clarify where abscess is. She states it's a dental abscess that [facility] had rx'd meds for her during her pregnancy. She has sent a my chart message. Advised Dr. Bonney Aid will need to review and will respond thru my chart.

## 2020-04-11 ENCOUNTER — Encounter: Payer: Self-pay | Admitting: Surgery

## 2020-04-11 ENCOUNTER — Ambulatory Visit (INDEPENDENT_AMBULATORY_CARE_PROVIDER_SITE_OTHER): Payer: Medicaid Other | Admitting: Surgery

## 2020-04-11 ENCOUNTER — Other Ambulatory Visit: Payer: Self-pay

## 2020-04-11 VITALS — BP 149/99 | HR 112 | Temp 95.5°F | Ht 65.0 in | Wt 210.2 lb

## 2020-04-11 DIAGNOSIS — R160 Hepatomegaly, not elsewhere classified: Secondary | ICD-10-CM | POA: Diagnosis not present

## 2020-04-11 NOTE — Patient Instructions (Addendum)
Dr.Pabon discussed with patient today, he would like for patient to see Gastroenterologist before proceeding with surgical treatment.  Patient is scheduled for MRI Friday May 28th at 9:00am with arrival time 8:30am. Patient is NOT to have anything to eat or drink 4 hours prior to scan. Dr.Cynthia [facility] - Oral Surgeon [address], [place], [place] [address] [phone]  Umbilical Hernia, Adult  A hernia is a bulge of tissue that pushes through an opening between muscles. An umbilical hernia happens in the abdomen, near the belly button (umbilicus). The hernia may contain tissues from the small intestine, large intestine, or fatty tissue covering the intestines (omentum). Umbilical hernias in adults tend to get worse over time, and they require surgical treatment. There are several types of umbilical hernias. You may have:  A hernia located just above or below the umbilicus (indirect hernia). This is the most common type of umbilical hernia in adults.  A hernia that forms through an opening formed by the umbilicus (direct hernia).  A hernia that comes and goes (reducible hernia). A reducible hernia may be visible only when you strain, lift something heavy, or cough. This type of hernia can be pushed back into the abdomen (reduced).  A hernia that traps abdominal tissue inside the hernia (incarcerated hernia). This type of hernia cannot be reduced.  A hernia that cuts off blood flow to the tissues inside the hernia (strangulated hernia). The tissues can start to die if this happens. This type of hernia requires emergency treatment. What are the causes? An umbilical hernia happens when tissue inside the abdomen presses on a weak area of the abdominal muscles. What increases the risk? You may have a greater risk of this condition if you:  Are obese.  Have had several pregnancies.  Have a buildup of fluid inside your abdomen (ascites).  Have had surgery that weakens the abdominal  muscles. What are the signs or symptoms? The main symptom of this condition is a painless bulge at or near the belly button. A reducible hernia may be visible only when you strain, lift something heavy, or cough. Other symptoms may include:  Dull pain.  A feeling of pressure. Symptoms of a strangulated hernia may include:  Pain that gets increasingly worse.  Nausea and vomiting.  Pain when pressing on the hernia.  Skin over the hernia becoming red or purple.  Constipation.  Blood in the stool. How is this diagnosed? This condition may be diagnosed based on:  A physical exam. You may be asked to cough or strain while standing. These actions increase the pressure inside your abdomen and force the hernia through the opening in your muscles. Your health care provider may try to reduce the hernia by pressing on it.  Your symptoms and medical history. How is this treated? Surgery is the only treatment for an umbilical hernia. Surgery for a strangulated hernia is done as soon as possible. If you have a small hernia that is not incarcerated, you may need to lose weight before having surgery. Follow these instructions at home:  Lose weight, if told by your health care provider.  Do not try to push the hernia back in.  Watch your hernia for any changes in color or size. Tell your health care provider if any changes occur.  You may need to avoid activities that increase pressure on your hernia.  Do not lift anything that is heavier than 10 lb (4.5 kg) until your health care provider says that this is safe.  Take over-the-counter and prescription medicines only as told by your health care provider.  Keep all follow-up visits as told by your health care provider. This is important. Contact a health care provider if:  Your hernia gets larger.  Your hernia becomes painful. Get help right away if:  You develop sudden, severe pain near the area of your hernia.  You have pain as  well as nausea or vomiting.  You have pain and the skin over your hernia changes color.  You develop a fever. This information is not intended to replace advice given to you by your health care provider. Make sure you discuss any questions you have with your health care provider. Document Revised: 12/25/2017 Document Reviewed: 05/13/2017 Elsevier Patient Education  2020 [employer].

## 2020-04-11 NOTE — Progress Notes (Signed)
Outpatient Surgical Follow Up  04/11/2020  Joy [name] is an 29 y.o. female.   Chief Complaint  Patient presents with  . Follow-up    1 mo f/u recall Hiatal and umbilical hernias, CT abd/pel 03/23/20    HPI: Joy [name] is a 29 year old female with a BMI of 35 being followed for chronic abdominal pain and a questionable hiatal hernia.  She does have a known history of a small ventral hernia.  She continues to have intermittent abdominal pain that is actually to both left and right flanks.  She does have some clay colored stool.  I did order a CT scan that I have personally reviewed and shared the images with her.  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  There is no evidence of hiatal hernia.  There is no evidence of any other acute intra-abdominal abnormality. LFTs and CMP as well as CBC and INR personally reviewed and they are normal.  Only slight elevation of the AST.  Past Medical History:  Diagnosis Date  . Anemia   . Anxiety   . Asperger's syndrome   . Asthma   . Chronic back pain   . Colon polyps   . Diabetes mellitus without complication ([place])   . GERD (gastroesophageal reflux disease)   . Headache   . History of IBS   . History of seizures as a child   . Hyperlipidemia   . Hypertension   . Long Q-T syndrome   . Mental disorder    Depression. past suicide attempt  . PCOS (polycystic ovarian syndrome)   . Seizures ([place])   . Single umbilical artery   . Tooth decay     Past Surgical History:  Procedure Laterality Date  . CESAREAN SECTION N/A 01/13/2020   Procedure: CESAREAN SECTION;  Surgeon: [name], MD;  Location: [facility] ORS;  Service: Obstetrics;  Laterality: N/A;  . COLONOSCOPY  age 33  . TONSILLECTOMY     tonsils, tubes, adnoids    Family History  Problem Relation Age of Onset  . Cancer Mother   . COPD Mother   . Von Willebrand disease Mother   . Thyroid cancer Mother    . Diabetes Mother   . Multiple sclerosis Mother   . Heart disease Father   . Supraventricular tachycardia Brother   . Multiple sclerosis Maternal Grandmother   . Parkinson's disease Maternal Grandmother   . Asthma Maternal Grandmother   . Heart disease Maternal Grandmother   . Alzheimer's disease Maternal Grandfather     Social History:  reports that she has been smoking cigarettes. She has a 2.50 pack-year smoking history. She has never used smokeless tobacco. She reports previous drug use. She reports that she does not drink alcohol.  Allergies:  Allergies  Allergen Reactions  . Liraglutide     Drug induced acute pancreatitis.   Yvette Rack [Cyclobenzaprine]     Mood changes  . Gabapentin Other (See Comments)    Mood changes  . Latex Hives  . Manganese Trace Metal Additives [Manganese] Rash  . Other Rash    Medications reviewed.    ROS Full ROS performed and is otherwise negative other than what is stated in HPI   BP (!) 149/99   Pulse (!) 112   Temp (!) 95.5 F (35.3 C) (Temporal)   Ht 5\' 5"  (1.651 m)   Wt 210 lb 3.2 oz (95.3 kg)  LMP 04/10/2020 (Approximate)   SpO2 97%   BMI 34.98 kg/m   Physical Exam Vitals and nursing note reviewed. Exam conducted with a chaperone present.  Constitutional:      General: She is not in acute distress.    Appearance: Normal appearance. She is normal weight.  Eyes:     General: No scleral icterus.       Right eye: No discharge.        Left eye: No discharge.  Cardiovascular:     Heart sounds: No murmur.  Pulmonary:     Effort: Pulmonary effort is normal. No respiratory distress.  Abdominal:     General: Bowel sounds are normal. There is no distension.     Palpations: Abdomen is soft. There is no mass.     Tenderness: There is no abdominal tenderness. There is no guarding or rebound.     Hernia: A hernia is present.  Musculoskeletal:     Cervical back: Normal range of motion and neck supple. No rigidity or  tenderness.  Skin:    General: Skin is warm and dry.     Capillary Refill: Capillary refill takes less than 2 seconds.  Neurological:     General: No focal deficit present.     Mental Status: She is alert and oriented to person, place, and time.  Psychiatric:        Mood and Affect: Mood normal.        Behavior: Behavior normal.        Thought Content: Thought content normal.        Judgment: Judgment normal.      Assessment/Plan: Joy [name] is a 29 year old female with history of chronic abdominal pain and chronic intractable nausea.  Does have a small ventral hernia but I do not believe this is the source of her symptoms.  I do believe that this is more of a functional GI issue.  As far as her liver lesion in the CT she is concerned and she wants me to make sure this is not anything that she needs to worry about.  As per renal recommendation we will order an MRI to exclude any potential malignancy although the CT scan is consistent with hemangioma. Extensive counseling provided.  She does have a pending GI visit and we also discussed with her about her appointment with oral surgery. Greater than 50% of the 40 minutes  visit was spent in counseling/coordination of care   Sterling Big, MD [facility] General Surgeon

## 2020-04-22 ENCOUNTER — Other Ambulatory Visit: Payer: Self-pay

## 2020-04-22 ENCOUNTER — Ambulatory Visit
Admission: RE | Admit: 2020-04-22 | Discharge: 2020-04-22 | Disposition: A | Discharge status: Home / Self Care | Payer: Medicaid Other | Source: Ambulatory Visit | Attending: Surgery | Admitting: Surgery

## 2020-04-22 DIAGNOSIS — R160 Hepatomegaly, not elsewhere classified: Secondary | ICD-10-CM | POA: Diagnosis not present

## 2020-04-22 MED ORDER — GADOBUTROL 1 MMOL/ML IV SOLN
10.0000 mL | Freq: Once | INTRAVENOUS | Status: AC | PRN
  Administered 2020-04-22: 10 mL via INTRAVENOUS

## 2020-04-24 ENCOUNTER — Encounter: Payer: Self-pay | Admitting: Nurse Practitioner

## 2020-04-24 DIAGNOSIS — I152 Hypertension secondary to endocrine disorders: Secondary | ICD-10-CM | POA: Insufficient documentation

## 2020-04-26 ENCOUNTER — Telehealth: Payer: Self-pay | Admitting: Emergency Medicine

## 2020-04-26 NOTE — Telephone Encounter (Signed)
Pt made aware of results, voiced understanding and no further concerns.

## 2020-04-26 NOTE — Telephone Encounter (Signed)
-----   Message from Leafy Ro, MD sent at 04/26/2020  7:23 AM EDT ----- Please let her know that MRI did show a gallstone but no cancer in the liver, may come back for f/u visit at her convenience ----- Message ----- From: Interface, Rad Results In Sent: 04/22/2020  12:08 PM EDT To: Leafy Ro, MD

## 2020-04-26 NOTE — Telephone Encounter (Signed)
Called pt with NO answer, left vm to call back to the office.  Pt needs to be made of results once she calls back.

## 2020-04-28 ENCOUNTER — Ambulatory Visit: Payer: Medicaid Other | Admitting: Nurse Practitioner

## 2020-05-05 ENCOUNTER — Other Ambulatory Visit: Payer: Self-pay | Admitting: Obstetrics and Gynecology

## 2020-05-05 NOTE — Telephone Encounter (Signed)
Needs to establish with PCP

## 2020-05-05 NOTE — Telephone Encounter (Signed)
Please advise 

## 2020-05-06 ENCOUNTER — Ambulatory Visit (INDEPENDENT_AMBULATORY_CARE_PROVIDER_SITE_OTHER): Payer: Medicaid Other | Admitting: Nurse Practitioner

## 2020-05-06 ENCOUNTER — Other Ambulatory Visit: Payer: Self-pay

## 2020-05-06 ENCOUNTER — Encounter: Payer: Self-pay | Admitting: Nurse Practitioner

## 2020-05-06 VITALS — BP 136/84 | HR 105 | Temp 98.1°F | Ht 65.0 in | Wt 209.0 lb

## 2020-05-06 DIAGNOSIS — I10 Essential (primary) hypertension: Secondary | ICD-10-CM

## 2020-05-06 DIAGNOSIS — F845 Asperger's syndrome: Secondary | ICD-10-CM

## 2020-05-06 DIAGNOSIS — E785 Hyperlipidemia, unspecified: Secondary | ICD-10-CM

## 2020-05-06 DIAGNOSIS — I152 Hypertension secondary to endocrine disorders: Secondary | ICD-10-CM

## 2020-05-06 DIAGNOSIS — Z6834 Body mass index (BMI) 34.0-34.9, adult: Secondary | ICD-10-CM

## 2020-05-06 DIAGNOSIS — E1169 Type 2 diabetes mellitus with other specified complication: Secondary | ICD-10-CM

## 2020-05-06 DIAGNOSIS — E1159 Type 2 diabetes mellitus with other circulatory complications: Secondary | ICD-10-CM

## 2020-05-06 DIAGNOSIS — E139 Other specified diabetes mellitus without complications: Secondary | ICD-10-CM

## 2020-05-06 DIAGNOSIS — Z7689 Persons encountering health services in other specified circumstances: Secondary | ICD-10-CM

## 2020-05-06 DIAGNOSIS — F445 Conversion disorder with seizures or convulsions: Secondary | ICD-10-CM

## 2020-05-06 DIAGNOSIS — K029 Dental caries, unspecified: Secondary | ICD-10-CM

## 2020-05-06 DIAGNOSIS — I4581 Long QT syndrome: Secondary | ICD-10-CM

## 2020-05-06 DIAGNOSIS — E6609 Other obesity due to excess calories: Secondary | ICD-10-CM

## 2020-05-06 DIAGNOSIS — E282 Polycystic ovarian syndrome: Secondary | ICD-10-CM

## 2020-05-06 DIAGNOSIS — F600 Paranoid personality disorder: Secondary | ICD-10-CM

## 2020-05-06 MED ORDER — BACLOFEN 10 MG PO TABS: 10.0000 mg | ORAL_TABLET | Freq: Three times a day (TID) | ORAL | 3 refills | Status: DC

## 2020-05-06 MED ORDER — METFORMIN HCL 1000 MG PO TABS: 1000.0000 mg | ORAL_TABLET | Freq: Every day | ORAL | 3 refills | Status: DC

## 2020-05-06 MED ORDER — TRIMETHOBENZAMIDE HCL 300 MG PO CAPS: 300.0000 mg | ORAL_CAPSULE | Freq: Three times a day (TID) | ORAL | 2 refills | Status: DC

## 2020-05-06 NOTE — Assessment & Plan Note (Signed)
Chronic, ongoing.  No current statin.  May add this on in future for benefit with her diabetes.  Plan on lipid panel next visit.  Return in 3 weeks.

## 2020-05-06 NOTE — Patient Instructions (Signed)

## 2020-05-06 NOTE — Assessment & Plan Note (Signed)
Chronic, ongoing.  Continue Klonopin, no refills today due to new patient visit, and Clonidine.  She had upcoming appointment with psychiatry, will benefit from this and their guidance.  Return in 3 weeks for follow-up.

## 2020-05-06 NOTE — Assessment & Plan Note (Signed)
Referral placed to dental, she is schedule to see upcoming.

## 2020-05-06 NOTE — Assessment & Plan Note (Signed)
Chronic, ongoing with BP slightly above goal today.  At this time will continue Clonidine, which is for mood and HTN, per her report has tried multiple different medications in past and BP would drop.  Recommend she monitor BP at home a few days a week and document + focus on DASH diet at home.  Plan on obtaining BMP next visit.  Return in 3 weeks for visit and labs.

## 2020-05-06 NOTE — Assessment & Plan Note (Addendum)
BMI 34.78.  Recommended eating smaller high protein, low fat meals more frequently and exercising 30 mins a day 5 times a week with a goal of 10-15lb weight loss in the next 3 months. Patient voiced their understanding and motivation to adhere to these recommendations.  

## 2020-05-06 NOTE — Assessment & Plan Note (Signed)
Chronic, ongoing.  Will place referral to neurology for further guidance and recommendations, patient has long standing history of seizure disorder.
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
## 2020-05-06 NOTE — Assessment & Plan Note (Signed)
Chronic, ongoing.  Currently insulin dependent for some time.  Will plan on obtaining A1C next visit, along with urine ALB and BMP.  Continue current medication regimen at this time and adjust as needed.  Recommend she continue to monitor BS consistently at home, four times a day.  May benefit from [facility].  Referral placed to endocrinology, would benefit from their guidance due to uncontrolled sugars after having her baby.  ? Insulin pump. Return in 3 weeks.

## 2020-05-06 NOTE — Assessment & Plan Note (Signed)
Continue collaboration with cardiology as needed, recent note reviewed, and continue Clonidine.

## 2020-05-06 NOTE — Assessment & Plan Note (Signed)
Chronic, ongoing, has been on Metformin since age 29.  Continue current medication regimen and adjust as needed.  Referral placed to endocrinology.

## 2020-05-06 NOTE — Assessment & Plan Note (Signed)
Chronic, stable.  Will collaborate with psychiatry upcoming.

## 2020-05-09 ENCOUNTER — Ambulatory Visit (INDEPENDENT_AMBULATORY_CARE_PROVIDER_SITE_OTHER): Payer: Medicaid Other | Admitting: Obstetrics and Gynecology

## 2020-05-09 ENCOUNTER — Ambulatory Visit: Payer: Medicaid Other | Admitting: Nurse Practitioner

## 2020-05-09 ENCOUNTER — Other Ambulatory Visit: Payer: Self-pay

## 2020-05-09 ENCOUNTER — Encounter: Payer: Self-pay | Admitting: Obstetrics and Gynecology

## 2020-05-09 VITALS — BP 134/74 | HR 118 | Wt 211.0 lb

## 2020-05-09 DIAGNOSIS — R300 Dysuria: Secondary | ICD-10-CM

## 2020-05-09 DIAGNOSIS — F32A Depression, unspecified: Secondary | ICD-10-CM

## 2020-05-09 DIAGNOSIS — F419 Anxiety disorder, unspecified: Secondary | ICD-10-CM

## 2020-05-09 DIAGNOSIS — F329 Major depressive disorder, single episode, unspecified: Secondary | ICD-10-CM | POA: Diagnosis not present

## 2020-05-09 MED ORDER — DULOXETINE HCL 20 MG PO CPEP: 20.0000 mg | ORAL_CAPSULE | Freq: Every day | ORAL | 3 refills | Status: DC

## 2020-05-09 NOTE — Progress Notes (Signed)
Obstetrics & Gynecology Office Visit   Chief Complaint:  Chief Complaint  Patient presents with  . Follow-up    postpartum anxiety/depression    History of Present Illness: The patient is a 29 y.o. female presenting follow up for symptoms of depression.  The patient is currently taking klonopin prn for the management of her symptoms.  She has had any recent situational stressors (as previously described open CPS case, ex has violated restraining order by contacting her and her family).  She reports symptoms of anhedonia, irritability, social anxiety, feelings of guilt and feelings of worthlessness.  She denies risk taking behavior, suicidal ideation, homicidal ideation, auditory hallucinations and visual hallucinations. Symptoms have improved since last visit.     The patient does have a pre-existing history of depression and anxiety.  She  does not a prior history of suicide attempts.  Previous treatment tied include multiple SSRI's.  Also reports pain around C-section scar in the setting of chronic pain syndrome.  Abdominal CT/MRI were negative for acute findings from a gyn standpoint with no disruption or changes noted around the C-section scar.  She also reports some dysuria.  Review of Systems: Review of Systems  Constitutional: Negative.   Gastrointestinal: Positive for abdominal pain and nausea.  Genitourinary: Negative.   Psychiatric/Behavioral: Positive for depression. Negative for hallucinations, memory loss, substance abuse and suicidal ideas. The patient is nervous/anxious. The patient does not have insomnia.      Past Medical History:  Past Medical History:  Diagnosis Date  . Anemia   . Anxiety   . Asperger's syndrome   . Asthma   . Chronic back pain   . Colon polyps   . Diabetes mellitus without complication ([place])   . Epilepsia ([place])   . GERD (gastroesophageal reflux disease)   . Headache   . History of IBS   . History of seizures as a child   .  Hyperlipidemia   . Hypertension   . Long Q-T syndrome   . Mental disorder    Depression. past suicide attempt  . PCOS (polycystic ovarian syndrome)   . Seizures ([place])   . Single umbilical artery   . Tooth decay     Past Surgical History:  Past Surgical History:  Procedure Laterality Date  . adenectomy    . CESAREAN SECTION N/A 01/13/2020   Procedure: CESAREAN SECTION;  Surgeon: [name], MD;  Location: [facility] ORS;  Service: Obstetrics;  Laterality: N/A;  . CESAREAN SECTION    . COLONOSCOPY  age 40  . TONSILLECTOMY     tonsils, tubes, adnoids    Gynecologic History: Patient's last menstrual period was 04/10/2020 (approximate).  Obstetric History: G2P1011  Family History:  Family History  Problem Relation Age of Onset  . Cancer Mother   . COPD Mother   . Von Willebrand disease Mother   . Thyroid cancer Mother   . Diabetes Mother   . Multiple sclerosis Mother   . Colon cancer Mother   . Heart disease Father   . Alcohol abuse Father   . Mental illness Father   . Diverticulitis Father   . Supraventricular tachycardia Brother   . Hypertension Brother   . Multiple sclerosis Maternal Grandmother   . Parkinson's disease Maternal Grandmother   . Asthma Maternal Grandmother   . Heart disease Maternal Grandmother   . Lung cancer Maternal Grandmother   . Alzheimer's disease Maternal Grandfather   . Skin cancer Maternal Grandfather   . Diabetes Maternal Grandfather   .  Cancer Paternal Grandfather   . Hypertension Brother   . Post-traumatic stress disorder Brother   . Hypertension Brother     Social History:  Social History   Socioeconomic History  . Marital status: Single    Spouse name: michael  . Number of children: Not on file  . Years of education: Not on file  . Highest education level: Not on file  Occupational History  . Not on file  Tobacco Use  . Smoking status: Current Every Day Smoker    Packs/day: 0.25    Years: 10.00    Pack years: 2.50     Types: Cigarettes  . Smokeless tobacco: Never Used  Vaping Use  . Vaping Use: Never used  Substance and Sexual Activity  . Alcohol use: Not Currently  . Drug use: Not Currently  . Sexual activity: Not Currently    Birth control/protection: None, Abstinence  Other Topics Concern  . Not on file  Social History Narrative  . Not on file   Social Determinants of Health   Financial Resource Strain:   . Difficulty of Paying Living Expenses:   Food Insecurity:   . Worried About Programme researcher, broadcasting/film/video in the Last Year:   . Barista in the Last Year:   Transportation Needs:   . Freight forwarder (Medical):   [name] Lack of Transportation (Non-Medical):   Physical Activity:   . Days of Exercise per Week:   . Minutes of Exercise per Session:   Stress:   . Feeling of Stress :   Social Connections:   . Frequency of Communication with Friends and Family:   . Frequency of Social Gatherings with Friends and Family:   . Attends Religious Services:   . Active Member of Clubs or Organizations:   . Attends Banker Meetings:   [name] Marital Status:   Intimate Partner Violence:   . Fear of Current or Ex-Partner:   . Emotionally Abused:   [name] Physically Abused:   . Sexually Abused:     Allergies:  Allergies  Allergen Reactions  . Liraglutide     Drug induced acute pancreatitis.   [name] [Cyclobenzaprine]     Mood changes  . Flu Virus Vaccine Hives    Lost range of motion for about 3 months  . Gabapentin Other (See Comments)    Mood changes  . Latex Hives  . Manganese Trace Metal Additives [Manganese] Rash  . Other Rash    Medications: Prior to Admission medications   Medication Sig Start Date End Date Taking? Authorizing Provider  ACCU-CHEK GUIDE test strip TEST BLOOD SUGAR 4 TIMES A DAY 02/24/20  Yes Vena Austria, MD  albuterol (VENTOLIN HFA) 108 (90 Base) MCG/ACT inhaler INHALE 2 PUFFS BY MOUTH EVERY 4 HOURS AS NEEDED FOR WHEEZING OR SHORTNESS OF BREATH  03/15/20  Yes Vena Austria, MD  cholecalciferol (VITAMIN D3) 25 MCG (1000 UNIT) tablet Take 1,000 Units by mouth daily.   Yes [provider]  clonazePAM (KLONOPIN) 1 MG tablet Take 1 tablet (1 mg total) by mouth 2 (two) times daily. 12/10/19  Yes Schuman, Christanna R, MD  cloNIDine (CATAPRES) 0.3 MG tablet Take 1 tablet (0.3 mg total) by mouth daily. 12/10/19  Yes Schuman, Christanna R, MD  insulin glargine (LANTUS) 100 unit/mL SOPN Inject 0.26 mLs (26 Units total) into the skin at bedtime. 01/16/20  Yes Vena Austria, MD  insulin lispro (HUMALOG) 100 UNIT/ML KwikPen Inject 0.06 mLs (6 Units total) into the  skin 3 (three) times daily before meals. 01/16/20  Yes Vena Austria, MD  Insulin Pen Needle (PEN NEEDLES) 31G X 6 MM MISC 1 each by Does not apply route 4 (four) times daily. 12/10/19  Yes Schuman, Christanna R, MD  metFORMIN (GLUCOPHAGE) 1000 MG tablet Take 1 tablet (1,000 mg total) by mouth daily. At night 05/06/20  Yes Cannady, Jolene T, NP  trimethobenzamide (TIGAN) 300 MG capsule Take 1 capsule (300 mg total) by mouth 3 (three) times daily. 05/06/20  Yes [name], NP    Physical Exam Vitals:  Vitals:   05/09/20 1334  BP: 134/74  Pulse: (!) 118   Patient's last menstrual period was 04/10/2020 (approximate).  General: NAD HEENT: normocephalic, anicteric Pulmonary: No increased work of breathing Neurologic: Grossly intact Psychiatric: mood appropriate, affect full    GAD 7 : Generalized Anxiety Score 05/09/2020 05/06/2020  Nervous, Anxious, on Edge 2 3  Control/stop worrying 3 3  Worry too much - different things 2 3  Trouble relaxing 1 2  Restless 2 3  Easily annoyed or irritable 1 2  Afraid - awful might happen 2 1  Total GAD 7 Score 13 17  Anxiety Difficulty Somewhat difficult Somewhat difficult    Depression screen [facility] 2/9 05/09/2020 05/06/2020 01/04/2020  Decreased Interest 0 1 0  Down, Depressed, Hopeless 1 2 0  PHQ - 2 Score 1 3 0  Altered  sleeping 0 0 -  Tired, decreased energy 2 2 -  Change in appetite 2 2 -  Feeling bad or failure about yourself  1 1 -  Trouble concentrating 2 1 -  Moving slowly or fidgety/restless - 1 -  Suicidal thoughts 0 0 -  PHQ-9 Score 8 10 -  Difficult doing work/chores Somewhat difficult Somewhat difficult -    Depression screen [facility] 2/9 05/09/2020 05/06/2020 01/04/2020 12/28/2019 12/21/2019  Decreased Interest 0 1 0 0 1  Down, Depressed, Hopeless 1 2 0 0 1  PHQ - 2 Score 1 3 0 0 2  Altered sleeping 0 0 - - 3  Tired, decreased energy 2 2 - - 2  Change in appetite 2 2 - - 0  Feeling bad or failure about yourself  1 1 - - 0  Trouble concentrating 2 1 - - 2  Moving slowly or fidgety/restless - 1 - - 1  Suicidal thoughts 0 0 - - 0  PHQ-9 Score 8 10 - - 10  Difficult doing work/chores Somewhat difficult Somewhat difficult - - -     Assessment: 29 y.o. G2P1011 follow up depression, new onset dysuria  Plan: Problem List Items Addressed This Visit    None    Visit Diagnoses    Anxiety and depression    -  Primary   Relevant Medications   DULoxetine (CYMBALTA) 20 MG capsule   Dysuria       Relevant Orders   Urine Culture      1) Depression - start cymbalta 20mg  discussed may also have some benefit for patient's chronic pain.  Discussed long term Klonopin not a viable option for chronic management although she has been on it for several years.    2) Thyroid and B12 screen has been obtained previously  3) Dysuria - UCx sent today  4) Return in about 4 weeks (around 06/06/2020) for 4-6 WEEK MEDICATION FOLLOW UP.    08/07/2020, MD, Vena Austria [facility], [facility] 05/09/2020, 1:42 PM

## 2020-05-11 LAB — URINE CULTURE

## 2020-05-12 ENCOUNTER — Other Ambulatory Visit: Payer: Self-pay | Admitting: Nurse Practitioner

## 2020-05-12 MED ORDER — BACLOFEN 10 MG PO TABS: 10.0000 mg | ORAL_TABLET | Freq: Three times a day (TID) | ORAL | 3 refills | Status: DC

## 2020-05-12 MED ORDER — PROMETHAZINE HCL 12.5 MG PO TABS
12.5000 mg | ORAL_TABLET | Freq: Four times a day (QID) | ORAL | 2 refills | Status: DC | PRN
Start: 2020-05-12 — End: 2021-02-28

## 2020-05-16 ENCOUNTER — Ambulatory Visit (INDEPENDENT_AMBULATORY_CARE_PROVIDER_SITE_OTHER): Payer: Medicaid Other | Admitting: Gastroenterology

## 2020-05-16 ENCOUNTER — Other Ambulatory Visit: Payer: Self-pay

## 2020-05-16 ENCOUNTER — Encounter: Payer: Self-pay | Admitting: Gastroenterology

## 2020-05-16 VITALS — BP 159/96 | HR 128 | Temp 98.7°F | Ht 65.0 in | Wt 211.2 lb

## 2020-05-16 DIAGNOSIS — R933 Abnormal findings on diagnostic imaging of other parts of digestive tract: Secondary | ICD-10-CM

## 2020-05-16 DIAGNOSIS — R935 Abnormal findings on diagnostic imaging of other abdominal regions, including retroperitoneum: Secondary | ICD-10-CM | POA: Diagnosis not present

## 2020-05-16 DIAGNOSIS — Z86010 Personal history of colonic polyps: Secondary | ICD-10-CM

## 2020-05-16 DIAGNOSIS — R748 Abnormal levels of other serum enzymes: Secondary | ICD-10-CM | POA: Diagnosis not present

## 2020-05-16 DIAGNOSIS — R109 Unspecified abdominal pain: Secondary | ICD-10-CM | POA: Diagnosis not present

## 2020-05-16 DIAGNOSIS — K760 Fatty (change of) liver, not elsewhere classified: Secondary | ICD-10-CM

## 2020-05-16 MED ORDER — NA SULFATE-K SULFATE-MG SULF 17.5-3.13-1.6 GM/177ML PO SOLN: 354.0000 mL | Freq: Once | ORAL | 0 refills | Status: AC

## 2020-05-16 NOTE — Progress Notes (Signed)
Joy [name] [address]  [address]  [place], [place] [address]  Main: [phone]  Fax: [phone]   Gastroenterology Consultation  Referring Provider:     Leafy Ro, MD Primary Care Physician:  [name], NP Reason for Consultation:    Abdominal pain, nausea        HPI:    Chief Complaint  Patient presents with  . New Patient (Initial Visit)  . Abdominal Pain    All over her stomach   . Nausea    all the time   . Constipation    goes from constipation to diarrhea     Joy [name] is a 29 y.o. y/o female referred for consultation & management  by Dr. [name], NP.  Patient reports chronic history of above symptoms.  Reports history of an EGD and colonoscopy 10 years ago in [place].  States polyps were removed and she was told that if they had stayed in longer, they could have grown.  We do not have the procedure or pathology report.  Also reports history of colon cancer in her mother in her early 26s.  No blood in stool.  Reports diffuse abdominal pain, dull, 5/10, nonradiating, unrelated to meals.  Also reports 4-6 loose stools a day, without blood.  States was previously told she had IBS.  No dysphagia.  Has a 74-month-old at home, is not breast-feeding.  Has history of anxiety and depression.  Recent MRI with fat stranding around second and third portion of duodenum reported.  Fatty liver reported.  Past Medical History:  Diagnosis Date  . Anemia   . Anxiety   . Asperger's syndrome   . Asthma   . Chronic back pain   . Colon polyps   . Diabetes mellitus without complication ([facility])   . Epilepsia ([facility])   . GERD (gastroesophageal reflux disease)   . Headache   . History of IBS   . History of seizures as a child   . Hyperlipidemia   . Hypertension   . Long Q-T syndrome   . Mental disorder    Depression. past suicide attempt  . PCOS (polycystic ovarian syndrome)   . Seizures ([facility])   . Single umbilical artery   . Tooth  decay     Past Surgical History:  Procedure Laterality Date  . adenectomy    . CESAREAN SECTION N/A 01/13/2020   Procedure: CESAREAN SECTION;  Surgeon: Vena Austria, MD;  Location: [facility] ORS;  Service: Obstetrics;  Laterality: N/A;  . CESAREAN SECTION    . COLONOSCOPY  age 77  . TONSILLECTOMY     tonsils, tubes, adnoids    Prior to Admission medications   Medication Sig Start Date End Date Taking? Authorizing Provider  ACCU-CHEK GUIDE test strip TEST BLOOD SUGAR 4 TIMES A DAY 02/24/20  Yes Vena Austria, MD  albuterol (VENTOLIN HFA) 108 (90 Base) MCG/ACT inhaler INHALE 2 PUFFS BY MOUTH EVERY 4 HOURS AS NEEDED FOR WHEEZING OR SHORTNESS OF BREATH 03/15/20  Yes Vena Austria, MD  baclofen (LIORESAL) 10 MG tablet Take 1 tablet (10 mg total) by mouth 3 (three) times daily. 05/12/20  Yes Cannady, Jolene T, NP  cholecalciferol (VITAMIN D3) 25 MCG (1000 UNIT) tablet Take 1,000 Units by mouth daily.   Yes [provider]  clonazePAM (KLONOPIN) 1 MG tablet Take 1 tablet (1 mg total) by mouth 2 (two) times daily. 12/10/19  Yes Schuman, Christanna R, MD  cloNIDine (CATAPRES) 0.3 MG tablet  Take 1 tablet (0.3 mg total) by mouth daily. 12/10/19  Yes Schuman, Christanna R, MD  insulin glargine (LANTUS) 100 unit/mL SOPN Inject 0.26 mLs (26 Units total) into the skin at bedtime. 01/16/20  Yes Vena Austria, MD  insulin lispro (HUMALOG) 100 UNIT/ML KwikPen Inject 0.06 mLs (6 Units total) into the skin 3 (three) times daily before meals. 01/16/20  Yes Vena Austria, MD  Insulin Pen Needle (PEN NEEDLES) 31G X 6 MM MISC 1 each by Does not apply route 4 (four) times daily. 12/10/19  Yes Schuman, Christanna R, MD  metFORMIN (GLUCOPHAGE) 1000 MG tablet Take 1 tablet (1,000 mg total) by mouth daily. At night 05/06/20  Yes Cannady, Jolene T, NP  promethazine (PHENERGAN) 12.5 MG tablet Take 1 tablet (12.5 mg total) by mouth every 6 (six) hours as needed for nausea or vomiting. 05/12/20  Yes [name], NP    Family History  Problem Relation Age of Onset  . Cancer Mother   . COPD Mother   . Von Willebrand disease Mother   . Thyroid cancer Mother   . Diabetes Mother   . Multiple sclerosis Mother   . Colon cancer Mother   . Heart disease Father   . Alcohol abuse Father   . Mental illness Father   . Diverticulitis Father   . Supraventricular tachycardia Brother   . Hypertension Brother   . Multiple sclerosis Maternal Grandmother   . Parkinson's disease Maternal Grandmother   . Asthma Maternal Grandmother   . Heart disease Maternal Grandmother   . Lung cancer Maternal Grandmother   . Alzheimer's disease Maternal Grandfather   . Skin cancer Maternal Grandfather   . Diabetes Maternal Grandfather   . Cancer Paternal Grandfather   . Hypertension Brother   . Post-traumatic stress disorder Brother   . Hypertension Brother      Social History   Tobacco Use  . Smoking status: Current Every Day Smoker    Packs/day: 0.25    Years: 10.00    Pack years: 2.50    Types: Cigarettes  . Smokeless tobacco: Never Used  Vaping Use  . Vaping Use: Never used  Substance Use Topics  . Alcohol use: Not Currently  . Drug use: Not Currently    Allergies as of 05/16/2020 - Review Complete 05/16/2020  Allergen Reaction Noted  . Liraglutide  12/09/2019  . Flexeril [cyclobenzaprine]  12/09/2019  . Flu virus vaccine Hives 05/06/2020  . Gabapentin Other (See Comments) 12/09/2019  . Latex Hives 12/09/2019  . Manganese trace metal additives [manganese] Rash 12/09/2019  . Other Rash 04/11/2018    Review of Systems:    All systems reviewed and negative except where noted in HPI.   Physical Exam:  BP (!) 159/96 (BP Location: Left Arm, Patient Position: Sitting, Cuff Size: Normal)   Pulse (!) 128   Temp 98.7 F (37.1 C) (Oral)   Ht 5\' 5"  (1.651 m)   Wt 211 lb 4 oz (95.8 kg)   BMI 35.15 kg/m  No LMP recorded. (Menstrual status: Irregular Periods). Psych:  Alert and cooperative.  Normal mood and affect. General:   Alert,  Well-developed, well-nourished, pleasant and cooperative in NAD Head:  Normocephalic and atraumatic. Eyes:  Sclera clear, no icterus.   Conjunctiva pink. Ears:  Normal auditory acuity. Nose:  No deformity, discharge, or lesions. Mouth:  No deformity or lesions,oropharynx pink & moist. Neck:  Supple; no masses or thyromegaly. Abdomen:  Normal bowel sounds.  No bruits.  Soft, non-tender and  non-distended without masses, hepatosplenomegaly or hernias noted.  No guarding or rebound tenderness.    Msk:  Symmetrical without gross deformities. Good, equal movement & strength bilaterally. Pulses:  Normal pulses noted. Extremities:  No clubbing or edema.  No cyanosis. Neurologic:  Alert and oriented x3;  grossly normal neurologically. Skin:  Intact without significant lesions or rashes. No jaundice. Lymph Nodes:  No significant cervical adenopathy. Psych:  Alert and cooperative. Normal mood and affect.   Labs: CBC    Component Value Date/Time   WBC 7.9 03/23/2020 1008   RBC 4.85 03/23/2020 1008   HGB 12.4 03/23/2020 1008   HGB 13.0 12/10/2019 1627   HGB 13.4 07/20/2019 0000   HGB 13.4 07/20/2019 0000   HCT 38.4 03/23/2020 1008   HCT 37.9 12/10/2019 1627   HCT 39 07/20/2019 0000   HCT 39 07/20/2019 0000   PLT 290 03/23/2020 1008   PLT 357 12/10/2019 1627   PLT 266 07/20/2019 0000   MCV 79.2 (L) 03/23/2020 1008   MCV 81 12/10/2019 1627   MCH 25.6 (L) 03/23/2020 1008   MCHC 32.3 03/23/2020 1008   RDW 14.6 03/23/2020 1008   RDW 14.5 12/10/2019 1627   LYMPHSABS 1.8 03/23/2020 1008   MONOABS 0.3 03/23/2020 1008   EOSABS 0.2 03/23/2020 1008   BASOSABS 0.0 03/23/2020 1008   CMP     Component Value Date/Time   NA 133 (L) 03/23/2020 1008   NA 134 12/10/2019 1627   K 3.9 03/23/2020 1008   CL 101 03/23/2020 1008   CO2 24 03/23/2020 1008   GLUCOSE 213 (H) 03/23/2020 1008   BUN 9 03/23/2020 1008   BUN 4 (L) 12/10/2019 1627   CREATININE 0.58  03/23/2020 1008   CALCIUM 8.5 (L) 03/23/2020 1008   PROT 7.2 03/23/2020 1008   PROT 6.5 12/10/2019 1627   ALBUMIN 3.7 03/23/2020 1008   ALBUMIN 3.7 (L) 12/10/2019 1627   AST 39 03/23/2020 1008   ALT 59 (H) 03/23/2020 1008   ALKPHOS 89 03/23/2020 1008   BILITOT 0.5 03/23/2020 1008   BILITOT <0.2 12/10/2019 1627   GFRNONAA >60 03/23/2020 1008   GFRAA >60 03/23/2020 1008    Imaging Studies: MR ABDOMEN WWO CONTRAST  Result Date: 04/22/2020 CLINICAL DATA:  Evaluate liver abnormality seen on recent CT. Abdominal pain, hiatal hernia and umbilical pain. EXAM: MRI ABDOMEN WITHOUT AND WITH CONTRAST TECHNIQUE: Multiplanar multisequence MR imaging of the abdomen was performed both before and after the administration of intravenous contrast. CONTRAST:  25mL GADAVIST GADOBUTROL 1 MMOL/ML IV SOLN COMPARISON:  CT AP 03/23/2020 FINDINGS: Lower chest: No acute findings. Hepatobiliary: There is diffuse hepatic steatosis. Focal area of more intense fatty deposition is identified within the central aspect of segment 4a corresponding to the CT abnormality. No suspicious enhancing liver lesions identified. 3 mm stone noted within the dependent portion of the gallbladder. No gallbladder wall thickening or inflammation. No biliary ductal dilatation. Pancreas: No mass, inflammatory changes, or other parenchymal abnormality identified. Spleen:  Within normal limits in size and appearance. Adrenals/Urinary Tract: Normal appearance of the adrenal glands. No kidney mass or hydronephrosis. Stomach/Bowel: There is mild fat stranding surrounding the second and third portions of the duodenum, image 77/16. No duodenal wall thickening or inflammation noted. The remaining visualized bowel loops are unremarkable. Vascular/Lymphatic: No pathologically enlarged lymph nodes identified. No abdominal aortic aneurysm demonstrated. Other:  None. Musculoskeletal: No suspicious bone lesions identified. IMPRESSION: 1. Diffuse hepatic steatosis  with more focal fatty deposition within segment 4 a  of liver accounting for the CT abnormality. No suspicious liver lesions identified at this time. 2. Soft tissue stranding is incidentally noted surrounding the second and third portions of the duodenum, nonspecific but may be seen with duodenitis. Clinical correlation advised 3. Gallstone. Electronically Signed   By: Signa Kell M.D.   On: 04/22/2020 12:06    Assessment and Plan:   Kema Santaella is a 29 y.o. y/o female has been referred for abdominal pain  Due to abnormal findings on MRI, and ongoing abdominal pain, would be useful to rule out duodenitis, or ulcers in the duodenum  EGD indicated for the above  As per patient's report, she had colon polyps before and she describes being told that if they were no longer they could prolong.  Question if she had precancerous polyps.  Again report not available and patient does not remember exactly what the procedure was done, but will go home and check for any documentation that she has.  Also has history of colon cancer in her mother at an early age.  Colonoscopy indicated for the above Also patient is reporting loose stools, chronically.  Also reporting urgency which is new.  Colonoscopy to rule out IBD, microscopic colitis  I have discussed alternative options, risks & benefits,  which include, but are not limited to, bleeding, infection, perforation,respiratory complication & drug reaction.  The patient agrees with this plan & written consent will be obtained.    We discussed options of conservative management with medications and further testing before proceeding with endoscopic procedures.  However, due to ongoing symptoms, patient would like to proceed with procedures at this time  She will call us with the name of the facility in [place] where she had her previous procedures done, after checking for this at home and we can send the records request there.  Finding of fatty liver on  imaging discussed with patient Diet, weight loss, and exercise encouraged along with avoiding hepatotoxic drugs including alcohol Risk of progression to cirrhosis if above measures are not instituted were discussed as well, and patient verbalized understanding   Dr Joy [name]  Speech recognition software was used to dictate the above note.

## 2020-05-20 ENCOUNTER — Telehealth: Payer: Self-pay | Admitting: Gastroenterology

## 2020-05-20 NOTE — Telephone Encounter (Signed)
Called Joy [name] from endoscopy unit to let her know that patient called and cancelled her procedure for 05/26/2020. Patient will call and reschedule at a later date.

## 2020-05-20 NOTE — Telephone Encounter (Signed)
Patient called & would like to have her procedures cancelled for 05-26-2020. She will call to r/s.

## 2020-05-24 ENCOUNTER — Other Ambulatory Visit: Payer: Self-pay

## 2020-05-24 ENCOUNTER — Encounter: Payer: Self-pay | Admitting: Nurse Practitioner

## 2020-05-24 ENCOUNTER — Ambulatory Visit: Payer: Medicaid Other | Admitting: Pharmacist

## 2020-05-24 ENCOUNTER — Ambulatory Visit: Payer: Medicaid Other | Admitting: Nurse Practitioner

## 2020-05-24 VITALS — BP 137/90 | HR 109 | Temp 98.6°F | Wt 207.0 lb

## 2020-05-24 DIAGNOSIS — E1159 Type 2 diabetes mellitus with other circulatory complications: Secondary | ICD-10-CM | POA: Diagnosis not present

## 2020-05-24 DIAGNOSIS — E139 Other specified diabetes mellitus without complications: Secondary | ICD-10-CM | POA: Diagnosis not present

## 2020-05-24 DIAGNOSIS — E538 Deficiency of other specified B group vitamins: Secondary | ICD-10-CM

## 2020-05-24 DIAGNOSIS — F600 Paranoid personality disorder: Secondary | ICD-10-CM | POA: Diagnosis not present

## 2020-05-24 DIAGNOSIS — E1169 Type 2 diabetes mellitus with other specified complication: Secondary | ICD-10-CM | POA: Diagnosis not present

## 2020-05-24 DIAGNOSIS — K029 Dental caries, unspecified: Secondary | ICD-10-CM

## 2020-05-24 DIAGNOSIS — E66811 Obesity, class 1: Secondary | ICD-10-CM

## 2020-05-24 DIAGNOSIS — Z6834 Body mass index (BMI) 34.0-34.9, adult: Secondary | ICD-10-CM

## 2020-05-24 DIAGNOSIS — E785 Hyperlipidemia, unspecified: Secondary | ICD-10-CM

## 2020-05-24 DIAGNOSIS — F445 Conversion disorder with seizures or convulsions: Secondary | ICD-10-CM

## 2020-05-24 DIAGNOSIS — E282 Polycystic ovarian syndrome: Secondary | ICD-10-CM

## 2020-05-24 DIAGNOSIS — E6609 Other obesity due to excess calories: Secondary | ICD-10-CM

## 2020-05-24 DIAGNOSIS — F17219 Nicotine dependence, cigarettes, with unspecified nicotine-induced disorders: Secondary | ICD-10-CM

## 2020-05-24 DIAGNOSIS — E079 Disorder of thyroid, unspecified: Secondary | ICD-10-CM

## 2020-05-24 DIAGNOSIS — I152 Hypertension secondary to endocrine disorders: Secondary | ICD-10-CM

## 2020-05-24 DIAGNOSIS — I10 Essential (primary) hypertension: Secondary | ICD-10-CM

## 2020-05-24 LAB — BAYER DCA HB A1C WAIVED: HB A1C (BAYER DCA - WAIVED): 10.4 % — ABNORMAL HIGH (ref <7.0)

## 2020-05-24 LAB — MICROALBUMIN, URINE WAIVED
Creatinine, Urine Waived: 100 mg/dL (ref 10–300)
Microalb, Ur Waived: 80 mg/L — ABNORMAL HIGH (ref 0–19)

## 2020-05-24 MED ORDER — INSULIN GLARGINE 100 UNITS/ML SOLOSTAR PEN: 32.0000 [IU] | PEN_INJECTOR | Freq: Every day | SUBCUTANEOUS | 0 refills | Status: DC

## 2020-05-24 MED ORDER — LOSARTAN POTASSIUM 25 MG PO TABS: 25.0000 mg | ORAL_TABLET | Freq: Every day | ORAL | 3 refills | Status: DC

## 2020-05-24 MED ORDER — INSULIN LISPRO (1 UNIT DIAL) 100 UNIT/ML (KWIKPEN): 4.0000 [IU] | PEN_INJECTOR | Freq: Three times a day (TID) | SUBCUTANEOUS | 11 refills | Status: DC

## 2020-05-24 MED ORDER — AMOXICILLIN 500 MG PO CAPS
500.0000 mg | ORAL_CAPSULE | Freq: Three times a day (TID) | ORAL | 0 refills | Status: DC
Start: 2020-05-24 — End: 2020-07-21
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
## 2020-05-24 NOTE — Assessment & Plan Note (Signed)
With current abscess, script for Amoxicillin sent.  Recommend she follow-up with dental ASAP, especially due to her sugars and T2DM.

## 2020-05-24 NOTE — Patient Instructions (Signed)
Visit Information  Goals Addressed              This Visit's Progress     Patient Stated   .  PharmD "I need help controlling my sugars" (pt-stated)        CARE PLAN ENTRY (see longitudinal plan of care for additional care plan information)  Current Barriers:  . Diabetes: uncontrolled; complicated by chronic medical conditions including PCOS, personality disorder, most recent A1c 10.4% o Issues w/ tooth abscess. Had to reschedule dental procedure.  o Very fluctuant appetite. Sometimes can't get full, sometimes never hungry.  o Reports self-dx cyclical vomiting syndrome. On a support group on Facebook. Reports N/V was better during pregnancy, but is back to being problematic now . Most recent eGFR: >60 mL/min . Current antihyperglycemic regimen: Lantus 32 units daily, Humalog 4-16 units per sliding scale, metformin 1000 mg BID . Current blood glucose readings:  o Fastings: 125-200s o 2 hour after meal: 200-300s . Cardiovascular risk reduction: o Current hypertensive regimen: clonidine 0.3 mg daily (reports hx of trying multiple other options, but that this was the only thing that helps manage BP) o Current hyperlipidemia regimen: n/a, 4 months post partum, not breast feeding o Current antiplatelet regimen: n/a . Personality disorder: clonazepam 1 mg BID; started by [facility] psychiatrist on risperidone 0.5 mg BID. Per patient report, plan for eventual transition to LAI if effictive and tolerated  . Nausea: promethazine 12.5 mg PRN . Chronic pain: baclofen 10 mg TID PRN, APAP PRN.   Pharmacist Clinical Goal(s):  [name] Over the next 90 days, patient will work with PharmD and primary care provider to address optimized medication management  Interventions: . Comprehensive medication review performed, medication list updated in electronic medical record . Inter-disciplinary care team collaboration (see longitudinal plan of care) . Patient scheduled for appt w/ endocrinology.  Discussed pursuing pump +/- CGM, as covered by Medicaid.  . Discussed current glucose readings. Before appt w/ endocrinology, increase Lantus to 35 units daily and increase Humalog sliding scale by 1 unit.  . Starting losartan today for proteinuria. Counseled to avoid combination w/ RAAS agent.   Patient Self Care Activities:  . Patient will check blood glucose QID, document, and provide at future appointments . Patient will take medications as prescribed . Patient will report any questions or concerns to provider   Initial goal documentation        The patient verbalized understanding of instructions provided today and declined a print copy of patient instruction materials.   Plan: - Scheduled f/u call in ~ 8 weeks  Catie Darnelle Maffucci, PharmD, [place] [phone]

## 2020-05-24 NOTE — Chronic Care Management (AMB) (Signed)
Chronic Care Management   Note  05/24/2020 Name: Joy [name] MRN: [id number] DOB: 08/10/1991   Subjective:  Joy [name] is a 29 y.o. year old female who is a primary care patient of Cannady, Barbaraann Faster, NP. The CCM team was consulted for assistance with chronic disease management and care coordination needs.     [name] was given information about Chronic Care Management services today including:  1. CCM service includes personalized support from designated clinical staff supervised by her physician, including individualized plan of care and coordination with other care providers 2. 24/7 contact phone numbers for assistance for urgent and routine care needs. 3. The patient may stop CCM services at any time (effective at the end of the month) by phone call to the office staff.  Review of patient status, including review of consultants reports, laboratory and other test data, was performed as part of comprehensive evaluation and provision of chronic care management services.   SDOH (Social Determinants of Health) assessments and interventions performed:    Objective:  Lab Results  Component Value Date   CREATININE 0.58 03/23/2020   CREATININE 0.60 03/23/2020   CREATININE 0.54 01/12/2020    Lab Results  Component Value Date   HGBA1C 6.9 (H) 12/10/2019    No results found for: CHOL, TRIG, HDL, CHOLHDL, VLDL, LDLCALC, LDLDIRECT  Clinical ASCVD: No  The ASCVD Risk score Mikey Bussing DC Jr., et al., 2013) failed to calculate for the following reasons:   The 2013 ASCVD risk score is only valid for ages 69 to 26    BP Readings from Last 3 Encounters:  05/24/20 137/90  05/16/20 (!) 159/96  05/09/20 134/74    Allergies  Allergen Reactions  . Liraglutide     Drug induced acute pancreatitis.   Yvette Rack [Cyclobenzaprine]     Mood changes  . Flu Virus Vaccine Hives    Lost range of motion for about 3 months  . Gabapentin Other (See Comments)    Mood changes  . Latex  Hives  . Manganese Trace Metal Additives [Manganese] Rash  . Other Rash    Medications Reviewed Today    Reviewed by Lesle Chris, [place] (Certified Medical Assistant) on 05/24/20 at 1058  Med List Status: <None>  Medication Order Taking? Sig Documenting Provider Last Dose Status Informant  ACCU-CHEK GUIDE test strip [id number] Yes TEST BLOOD SUGAR 4 TIMES A DAY [name], MD Taking Active   albuterol (VENTOLIN HFA) 108 (90 Base) MCG/ACT inhaler [id number] Yes INHALE 2 PUFFS BY MOUTH EVERY 4 HOURS AS NEEDED FOR WHEEZING OR SHORTNESS OF Nelly Laurence, MD Taking Active   baclofen (LIORESAL) 10 MG tablet [id number] Yes Take 1 tablet (10 mg total) by mouth 3 (three) times daily. Marnee Guarneri T, NP Taking Active   cholecalciferol (VITAMIN D3) 25 MCG (1000 UNIT) tablet [id number] Yes Take 1,000 Units by mouth daily. [provider] Taking Active   clonazePAM (KLONOPIN) 1 MG tablet [id number] Yes Take 1 tablet (1 mg total) by mouth 2 (two) times daily. [name], MD Taking Active   cloNIDine (CATAPRES) 0.3 MG tablet [id number] Yes Take 1 tablet (0.3 mg total) by mouth daily. [name], MD Taking Active   insulin glargine (LANTUS) 100 unit/mL SOPN [id number] Yes Inject 0.26 mLs (26 Units total) into the skin at bedtime. [name], MD Taking Active   insulin lispro (HUMALOG) 100 UNIT/ML KwikPen [id number] Yes Inject 0.06 mLs (6 Units total) into the skin 3 (three) times daily before meals. Georgianne Fick,  Conan Bowens, MD Taking Active   Insulin Pen Needle (PEN NEEDLES) 31G X 6 MM MISC [id number] Yes 1 each by Does not apply route 4 (four) times daily. [name], MD Taking Active   metFORMIN (GLUCOPHAGE) 1000 MG tablet [id number] Yes Take 1 tablet (1,000 mg total) by mouth daily. At night Venita Lick, NP Taking Active   promethazine (PHENERGAN) 12.5 MG tablet [id number] Yes Take 1 tablet (12.5 mg total) by mouth every 6 (six) hours as needed for nausea  or vomiting. Venita Lick, NP Taking Active            Assessment:   Goals Addressed              This Visit's Progress     Patient Stated   .  PharmD "I need help controlling my sugars" (pt-stated)        CARE PLAN ENTRY (see longitudinal plan of care for additional care plan information)  Current Barriers:  . Diabetes: uncontrolled; complicated by chronic medical conditions including PCOS, personality disorder, most recent A1c 10.4% o Issues w/ tooth abscess. Had to reschedule dental procedure.  o Very fluctuant appetite. Sometimes can't get full, sometimes never hungry.  o Reports self-dx cyclical vomiting syndrome. On a support group on Facebook. Reports N/V was better during pregnancy, but is back to being problematic now . Most recent eGFR: >60 mL/min . Current antihyperglycemic regimen: Lantus 32 units daily, Humalog 4-16 units per sliding scale, metformin 1000 mg BID . Current blood glucose readings:  o Fastings: 125-200s o 2 hour after meal: 200-300s . Cardiovascular risk reduction: o Current hypertensive regimen: clonidine 0.3 mg daily (reports hx of trying multiple other options, but that this was the only thing that helps manage BP) o Current hyperlipidemia regimen: n/a, 4 months post partum, not breast feeding o Current antiplatelet regimen: n/a . Personality disorder: clonazepam 1 mg BID; started by [facility] psychiatrist on risperidone 0.5 mg BID. Per patient report, plan for eventual transition to LAI if effictive and tolerated  . Nausea: promethazine 12.5 mg PRN . Chronic pain: baclofen 10 mg TID PRN, APAP PRN.   Pharmacist Clinical Goal(s):  [name] Over the next 90 days, patient will work with PharmD and primary care provider to address optimized medication management  Interventions: . Comprehensive medication review performed, medication list updated in electronic medical record . Inter-disciplinary care team collaboration (see longitudinal plan  of care) . Patient scheduled for appt w/ endocrinology. Discussed pursuing pump +/- CGM, as covered by Medicaid.  . Discussed current glucose readings. Before appt w/ endocrinology, increase Lantus to 35 units daily and increase Humalog sliding scale by 1 unit.  . Starting losartan today for proteinuria. Counseled to avoid combination w/ RAAS agent.   Patient Self Care Activities:  . Patient will check blood glucose QID, document, and provide at future appointments . Patient will take medications as prescribed . Patient will report any questions or concerns to provider   Initial goal documentation        Plan: - Scheduled f/u call in ~ 8 weeks  Catie Darnelle Maffucci, PharmD, [place] [phone]

## 2020-05-24 NOTE — Assessment & Plan Note (Addendum)
Chronic, ongoing with BP slightly above goal today.  At this time will continue Clonidine, which is for mood and HTN, per her report has tried multiple different medications in past and BP would drop.  Her urine today shows ALB 80 and A:C 30-300, have recommended starting Losartan 25 MG daily for BP and kidney protection -- she is willing to try this and script sent.  Is to alert provider if ADR present.  Recommend she monitor BP at home a few days a week and document + focus on DASH diet at home.  CMP and TSH today.  Return in 3 months for follow-up.

## 2020-05-24 NOTE — Assessment & Plan Note (Signed)
Recommended eating smaller high protein, low fat meals more frequently and exercising 30 mins a day 5 times a week with a goal of 10-15lb weight loss in the next 3 months. Patient voiced their understanding and motivation to adhere to these recommendations.  

## 2020-05-24 NOTE — Assessment & Plan Note (Signed)
Check TSH today

## 2020-05-24 NOTE — Assessment & Plan Note (Signed)
I have recommended complete cessation of tobacco use. I have discussed various options available for assistance with tobacco cessation including over the counter methods (Nicotine gum, patch and lozenges). We also discussed prescription options (Chantix, Nicotine Inhaler / Nasal Spray). The patient is not interested in pursuing any prescription tobacco cessation options at this time.  

## 2020-05-24 NOTE — Assessment & Plan Note (Signed)
Chronic, ongoing.  No current statin.  May add this on in future for benefit with her diabetes.  Plan on lipid panel today.  Return in 3 months for follow-up.

## 2020-05-24 NOTE — Assessment & Plan Note (Signed)
Chronic, ongoing.  Currently insulin dependent for some time.  A1C today 10.4% and urine ALB 80 with A:C 30-300.  Continue current medication regimen at this time and adjust Lantus to 35 units and add one unit to sliding scale -- she is scheduled to see endocrinology for initial visit in August.  Recommend she continue to monitor BS consistently at home, four times a day.  May benefit from [facility].  Will benefit from endo guidance due to uncontrolled sugars after having her baby.  ? Insulin pump. Return in 3 months.

## 2020-05-24 NOTE — Patient Instructions (Addendum)
Increase Lantus to 35 units and add one unit to your sliding scale insulin  Start Losartan 25 MG for kidney protection daily  Diabetes Mellitus and Nutrition, Adult When you have diabetes (diabetes mellitus), it is very important to have healthy eating habits because your blood sugar (glucose) levels are greatly affected by what you eat and drink. Eating healthy foods in the appropriate amounts, at about the same times every day, can help you:  Control your blood glucose.  Lower your risk of heart disease.  Improve your blood pressure.  Reach or maintain a healthy weight. Every person with diabetes is different, and each person has different needs for a meal plan. Your health care provider may recommend that you work with a diet and nutrition specialist (dietitian) to make a meal plan that is best for you. Your meal plan may vary depending on factors such as:  The calories you need.  The medicines you take.  Your weight.  Your blood glucose, blood pressure, and cholesterol levels.  Your activity level.  Other health conditions you have, such as heart or kidney disease. How do carbohydrates affect me? Carbohydrates, also called carbs, affect your blood glucose level more than any other type of food. Eating carbs naturally raises the amount of glucose in your blood. Carb counting is a method for keeping track of how many carbs you eat. Counting carbs is important to keep your blood glucose at a healthy level, especially if you use insulin or take certain oral diabetes medicines. It is important to know how many carbs you can safely have in each meal. This is different for every person. Your dietitian can help you calculate how many carbs you should have at each meal and for each snack. Foods that contain carbs include:  Bread, cereal, rice, pasta, and crackers.  Potatoes and corn.  Peas, beans, and lentils.  Milk and yogurt.  Fruit and juice.  Desserts, such as cakes, cookies,  ice cream, and candy. How does alcohol affect me? Alcohol can cause a sudden decrease in blood glucose (hypoglycemia), especially if you use insulin or take certain oral diabetes medicines. Hypoglycemia can be a life-threatening condition. Symptoms of hypoglycemia (sleepiness, dizziness, and confusion) are similar to symptoms of having too much alcohol. If your health care provider says that alcohol is safe for you, follow these guidelines:  Limit alcohol intake to no more than 1 drink per day for nonpregnant women and 2 drinks per day for men. One drink equals 12 oz of beer, 5 oz of wine, or 1 oz of hard liquor.  Do not drink on an empty stomach.  Keep yourself hydrated with water, diet soda, or unsweetened iced tea.  Keep in mind that regular soda, juice, and other mixers may contain a lot of sugar and must be counted as carbs. What are tips for following this plan?  Reading food labels  Start by checking the serving size on the "Nutrition Facts" label of packaged foods and drinks. The amount of calories, carbs, fats, and other nutrients listed on the label is based on one serving of the item. Many items contain more than one serving per package.  Check the total grams (g) of carbs in one serving. You can calculate the number of servings of carbs in one serving by dividing the total carbs by 15. For example, if a food has 30 g of total carbs, it would be equal to 2 servings of carbs.  Check the number of grams (g)  of saturated and trans fats in one serving. Choose foods that have low or no amount of these fats.  Check the number of milligrams (mg) of salt (sodium) in one serving. Most people should limit total sodium intake to less than 2,300 mg per day.  Always check the nutrition information of foods labeled as "low-fat" or "nonfat". These foods may be higher in added sugar or refined carbs and should be avoided.  Talk to your dietitian to identify your daily goals for nutrients listed  on the label. Shopping  Avoid buying canned, premade, or processed foods. These foods tend to be high in fat, sodium, and added sugar.  Shop around the outside edge of the grocery store. This includes fresh fruits and vegetables, bulk grains, fresh meats, and fresh dairy. Cooking  Use low-heat cooking methods, such as baking, instead of high-heat cooking methods like deep frying.  Cook using healthy oils, such as olive, canola, or sunflower oil.  Avoid cooking with butter, cream, or high-fat meats. Meal planning  Eat meals and snacks regularly, preferably at the same times every day. Avoid going long periods of time without eating.  Eat foods high in fiber, such as fresh fruits, vegetables, beans, and whole grains. Talk to your dietitian about how many servings of carbs you can eat at each meal.  Eat 4-6 ounces (oz) of lean protein each day, such as lean meat, chicken, fish, eggs, or tofu. One oz of lean protein is equal to: ? 1 oz of meat, chicken, or fish. ? 1 egg. ?  cup of tofu.  Eat some foods each day that contain healthy fats, such as avocado, nuts, seeds, and fish. Lifestyle  Check your blood glucose regularly.  Exercise regularly as told by your health care provider. This may include: ? 150 minutes of moderate-intensity or vigorous-intensity exercise each week. This could be brisk walking, biking, or water aerobics. ? Stretching and doing strength exercises, such as yoga or weightlifting, at least 2 times a week.  Take medicines as told by your health care provider.  Do not use any products that contain nicotine or tobacco, such as cigarettes and e-cigarettes. If you need help quitting, ask your health care provider.  Work with a Veterinary surgeon or diabetes educator to identify strategies to manage stress and any emotional and social challenges. Questions to ask a health care provider  Do I need to meet with a diabetes educator?  Do I need to meet with a  dietitian?  What number can I call if I have questions?  When are the best times to check my blood glucose? Where to find more information:  American Diabetes Association: diabetes.org  Academy of Nutrition and Dietetics: www.eatright.[employer] of Diabetes and Digestive and Kidney Diseases (NIH): CarFlippers.tn Summary  A healthy meal plan will help you control your blood glucose and maintain a healthy lifestyle.  Working with a diet and nutrition specialist (dietitian) can help you make a meal plan that is best for you.  Keep in mind that carbohydrates (carbs) and alcohol have immediate effects on your blood glucose levels. It is important to count carbs and to use alcohol carefully. This information is not intended to replace advice given to you by your health care provider. Make sure you discuss any questions you have with your health care provider. Document Revised: 10/25/2017 Document Reviewed: 12/17/2016 Elsevier Patient Education  2020 [employer].

## 2020-05-24 NOTE — Assessment & Plan Note (Signed)
Chronic, ongoing.  Scheduled to see neurology upcoming and appreciate recommendations, patient has long standing history of seizure disorder.

## 2020-05-24 NOTE — Assessment & Plan Note (Addendum)
Chronic, ongoing.  Continue Klonopin and Clonidine + Risperidone as prescribed by psychiatry.  Continue collaboration with psychiatry, [facility].  Return in 3 months.

## 2020-05-25 ENCOUNTER — Other Ambulatory Visit: Payer: Self-pay | Admitting: Nurse Practitioner

## 2020-05-25 DIAGNOSIS — E781 Pure hyperglyceridemia: Secondary | ICD-10-CM

## 2020-05-25 DIAGNOSIS — E785 Hyperlipidemia, unspecified: Secondary | ICD-10-CM

## 2020-05-25 DIAGNOSIS — E1169 Type 2 diabetes mellitus with other specified complication: Secondary | ICD-10-CM

## 2020-05-25 LAB — LIPID PANEL W/O CHOL/HDL RATIO
Cholesterol, Total: 503 mg/dL — ABNORMAL HIGH (ref 100–199)
HDL: 15 mg/dL — ABNORMAL LOW (ref >39)
Triglycerides: 2954 mg/dL (ref 0–149)

## 2020-05-25 LAB — VITAMIN B12: Vitamin B-12: 968 pg/mL (ref 232–1245)

## 2020-05-25 LAB — HEPATIC FUNCTION PANEL
ALT: 69 IU/L — ABNORMAL HIGH (ref 0–32)
AST: 41 IU/L — ABNORMAL HIGH (ref 0–40)
Albumin: 4.5 g/dL (ref 3.9–5.0)
Alkaline Phosphatase: 122 IU/L — ABNORMAL HIGH (ref 48–121)
Bilirubin Total: <0.2 mg/dL (ref 0.0–1.2)
Bilirubin, Direct: 0.11 mg/dL (ref 0.00–0.40)
Total Protein: 7.9 g/dL (ref 6.0–8.5)

## 2020-05-25 LAB — COMPREHENSIVE METABOLIC PANEL
ALT: 67 IU/L — ABNORMAL HIGH (ref 0–32)
AST: 47 IU/L — ABNORMAL HIGH (ref 0–40)
Albumin/Globulin Ratio: 1.4 (ref 1.2–2.2)
Albumin: 4.4 g/dL (ref 3.9–5.0)
Alkaline Phosphatase: 132 IU/L — ABNORMAL HIGH (ref 48–121)
BUN/Creatinine Ratio: 13 (ref 9–23)
BUN: 8 mg/dL (ref 6–20)
Bilirubin Total: <0.2 mg/dL (ref 0.0–1.2)
CO2: 17 mmol/L — ABNORMAL LOW (ref 20–29)
Calcium: 9.5 mg/dL (ref 8.7–10.2)
Chloride: 94 mmol/L — ABNORMAL LOW (ref 96–106)
Creatinine, Ser: 0.61 mg/dL (ref 0.57–1.00)
GFR calc Af Amer: 144 mL/min/{1.73_m2} (ref >59)
GFR calc non Af Amer: 125 mL/min/{1.73_m2} (ref >59)
Globulin, Total: 3.1 g/dL (ref 1.5–4.5)
Glucose: 369 mg/dL — ABNORMAL HIGH (ref 65–99)
Potassium: 4.2 mmol/L (ref 3.5–5.2)
Sodium: 131 mmol/L — ABNORMAL LOW (ref 134–144)
Total Protein: 7.5 g/dL (ref 6.0–8.5)

## 2020-05-25 LAB — HEPATITIS A ANTIBODY, TOTAL: hep A Total Ab: NEGATIVE

## 2020-05-25 LAB — HEPATITIS B CORE ANTIBODY, TOTAL: Hep B Core Total Ab: NEGATIVE

## 2020-05-25 LAB — HCV COMMENT:

## 2020-05-25 LAB — FERRITIN: Ferritin: 26 ng/mL (ref 15–150)

## 2020-05-25 LAB — ANTI-MICROSOMAL ANTIBODY LIVER / KIDNEY

## 2020-05-25 LAB — HEPATITIS B SURFACE ANTIGEN: Hepatitis B Surface Ag: NEGATIVE

## 2020-05-25 LAB — ANA

## 2020-05-25 LAB — HEPATITIS C ANTIBODY (REFLEX): HCV Ab: <0.1 s/co ratio (ref 0.0–0.9)

## 2020-05-25 LAB — CERULOPLASMIN: Ceruloplasmin: 32.7 mg/dL (ref 19.0–39.0)

## 2020-05-25 LAB — MITOCHONDRIAL/SMOOTH MUSCLE AB PNL

## 2020-05-25 LAB — TSH: TSH: 1.74 u[IU]/mL (ref 0.450–4.500)

## 2020-05-25 LAB — HEPATITIS B SURFACE ANTIBODY,QUALITATIVE: Hep B Surface Ab, Qual: REACTIVE

## 2020-05-25 MED ORDER — FENOFIBRATE 145 MG PO TABS: 145.0000 mg | ORAL_TABLET | Freq: Every day | ORAL | 4 refills | Status: DC

## 2020-05-25 NOTE — Progress Notes (Signed)
Contacted via MyChartGood afternoon Joy [name], please let me know you obtained this message and read: - Kidney function on labs is stable.  Sodium is a little low, which you have been in past too.  Most likely due to elevation in glucose levels, so please make sure to do the insulin changes we discussed and add a little salt daily to diet for now.  I suspect endocrinology will make some major changes upcoming.  - Thyroid and B12 level are normal - THE BIG THING -- your triglycerides are very high at 2954.  This may be reason you had pancreatitis in past, as this can cause that, and could be cause of some of your stomach issues.  I do recommend we start a medication to lower these ASAP.  It is called Fenofibrate and I have sent this into your pharmacy.  I want you to start taking daily.  I would like you to then schedule an outpatient only lab for 6 weeks in office, do not need to see me, just repeat labs to recheck this.  Let me know if you have any issues with medication.  Any questions? Keep being awesome!! Kindest regards, Sama Arauz

## 2020-05-26 ENCOUNTER — Ambulatory Visit: Admit: 2020-05-26 | Payer: Medicaid Other | Admitting: Gastroenterology

## 2020-05-26 SURGERY — COLONOSCOPY WITH PROPOFOL
Anesthesia: General

## 2020-06-06 ENCOUNTER — Telehealth: Payer: Self-pay

## 2020-06-06 ENCOUNTER — Ambulatory Visit: Payer: Medicaid Other | Admitting: Obstetrics and Gynecology

## 2020-06-06 DIAGNOSIS — R748 Abnormal levels of other serum enzymes: Secondary | ICD-10-CM

## 2020-06-06 NOTE — Telephone Encounter (Signed)
-----   Message from Pasty Spillers, MD sent at 06/02/2020 10:55 AM EDT ----- Joy [name] please let the patient know, blood work shows elevated liver enzymes.  I would recommend repeating these in 1 to 2 weeks, along with GGT.  Please also order all the below tests that show "canceled".

## 2020-06-06 NOTE — Telephone Encounter (Signed)
Called patient to let her know that her blood work showed elevated liver enzymes, therefore, Dr. Maximino Greenland would like to repeat her labs in 1-[redacted] weeks along with GGT and the labs that were cancelled due to [facility] stating that the blood collected for those labs were not good to be tested. Patient stated that she would go and see her PCP and that she will ask her to have Dr. Michele Mcalpine labs drawn at the same time. I told her that since they are part of [facility] and they used [facility] too, that it was possible. Patient had no further questions at this time.

## 2020-06-13 ENCOUNTER — Encounter: Payer: Self-pay | Admitting: Nurse Practitioner

## 2020-06-17 ENCOUNTER — Ambulatory Visit (INDEPENDENT_AMBULATORY_CARE_PROVIDER_SITE_OTHER): Payer: Medicaid Other | Admitting: Obstetrics and Gynecology

## 2020-06-17 ENCOUNTER — Encounter: Payer: Self-pay | Admitting: Obstetrics and Gynecology

## 2020-06-17 ENCOUNTER — Other Ambulatory Visit: Payer: Self-pay

## 2020-06-17 VITALS — BP 140/80 | Ht 65.0 in | Wt 210.0 lb

## 2020-06-17 DIAGNOSIS — F32A Depression, unspecified: Secondary | ICD-10-CM

## 2020-06-17 DIAGNOSIS — F329 Major depressive disorder, single episode, unspecified: Secondary | ICD-10-CM | POA: Diagnosis not present

## 2020-06-17 DIAGNOSIS — N939 Abnormal uterine and vaginal bleeding, unspecified: Secondary | ICD-10-CM | POA: Diagnosis not present

## 2020-06-17 DIAGNOSIS — F419 Anxiety disorder, unspecified: Secondary | ICD-10-CM | POA: Diagnosis not present

## 2020-06-17 NOTE — Progress Notes (Signed)
Obstetrics & Gynecology Office Visit   Chief Complaint:  Chief Complaint  Patient presents with  . Follow-up    anxiety    History of Present Illness: The patient is a 29 y.o. female presenting follow up for symptoms of anxiety and depression.  The patient is currently has transitioned care to behavior health given her long standing history of anxiety and depression.     The patient does have a pre-existing history of depression and anxiety.  She  does not a prior history of suicide attempts.   She does bring up that she has resumed menstruation, bottle feeding.  Menses have been irregular and heavy.  She is interested in starting something for cycle control.  Review of Systems: Review of systems negative unless noted in HPI  Past Medical History:  Past Medical History:  Diagnosis Date  . Allergy   . Anemia   . Anxiety   . Asperger's syndrome   . Asthma   . Chronic back pain   . Colon polyps   . Depression   . Diabetes mellitus without complication (HCC)   . Epilepsia (HCC)   . GERD (gastroesophageal reflux disease)   . Headache   . History of IBS   . History of seizures as a child   . Hyperlipidemia   . Hypertension   . Long Q-T syndrome   . Mental disorder    Depression. past suicide attempt  . PCOS (polycystic ovarian syndrome)   . Seizures (HCC)   . Single umbilical artery   . Tooth decay     Past Surgical History:  Past Surgical History:  Procedure Laterality Date  . adenectomy    . CESAREAN SECTION N/A 01/13/2020   Procedure: CESAREAN SECTION;  Surgeon: Vena Austria, MD;  Location: [facility] ORS;  Service: Obstetrics;  Laterality: N/A;  . CESAREAN SECTION    . COLONOSCOPY  age 26  . TONSILLECTOMY     tonsils, tubes, adnoids    Gynecologic History: No LMP recorded. (Menstrual status: Irregular Periods).  Obstetric History: G2P1011  Family History:  Family History  Problem Relation Age of Onset  . Cancer Mother   . COPD Mother   . Von  Willebrand disease Mother   . Thyroid cancer Mother   . Diabetes Mother   . Multiple sclerosis Mother   . Colon cancer Mother   . Heart disease Father   . Alcohol abuse Father   . Mental illness Father   . Diverticulitis Father   . Hypertension Father   . Supraventricular tachycardia Brother   . Hypertension Brother   . Multiple sclerosis Maternal Grandmother   . Parkinson's disease Maternal Grandmother   . Asthma Maternal Grandmother   . Heart disease Maternal Grandmother   . Lung cancer Maternal Grandmother   . Cancer Maternal Grandmother        skin  . Alzheimer's disease Maternal Grandfather   . Skin cancer Maternal Grandfather   . Diabetes Maternal Grandfather   . Cancer Paternal Grandmother   . Cancer Paternal Grandfather        brain  . Hypertension Brother   . Post-traumatic stress disorder Brother   . Hypertension Brother     Social History:  Social History   Socioeconomic History  . Marital status: Single    Spouse name: michael  . Number of children: Not on file  . Years of education: Not on file  . Highest education level: Not on file  Occupational History  .  Not on file  Tobacco Use  . Smoking status: Current Every Day Smoker    Packs/day: 0.25    Years: 10.00    Pack years: 2.50    Types: Cigarettes  . Smokeless tobacco: Never Used  Vaping Use  . Vaping Use: Never used  Substance and Sexual Activity  . Alcohol use: Not Currently  . Drug use: Not Currently  . Sexual activity: Not Currently    Birth control/protection: None, Abstinence  Other Topics Concern  . Not on file  Social History Narrative  . Not on file   Social Determinants of Health   Financial Resource Strain:   . Difficulty of Paying Living Expenses:   Food Insecurity:   . Worried About Programme researcher, broadcasting/film/video in the Last Year:   . Barista in the Last Year:   Transportation Needs:   . Freight forwarder (Medical):   [name] Lack of Transportation (Non-Medical):     Physical Activity:   . Days of Exercise per Week:   . Minutes of Exercise per Session:   Stress:   . Feeling of Stress :   Social Connections:   . Frequency of Communication with Friends and Family:   . Frequency of Social Gatherings with Friends and Family:   . Attends Religious Services:   . Active Member of Clubs or Organizations:   . Attends Banker Meetings:   [name] Marital Status:   Intimate Partner Violence:   . Fear of Current or Ex-Partner:   . Emotionally Abused:   [name] Physically Abused:   . Sexually Abused:     Allergies:  Allergies  Allergen Reactions  . Liraglutide     Drug induced acute pancreatitis.   [name] [Cyclobenzaprine]     Mood changes  . Flu Virus Vaccine Hives    Lost range of motion for about 3 months  . Gabapentin Other (See Comments)    Mood changes  . Invega [Paliperidone Er] Nausea And Vomiting  . Latex Hives  . Manganese Trace Metal Additives [Manganese] Rash  . Other Rash    Medications: Prior to Admission medications   Medication Sig Start Date End Date Taking? Authorizing Provider  ACCU-CHEK GUIDE test strip TEST BLOOD SUGAR 4 TIMES A DAY 02/24/20  Yes Vena Austria, MD  albuterol (VENTOLIN HFA) 108 (90 Base) MCG/ACT inhaler INHALE 2 PUFFS BY MOUTH EVERY 4 HOURS AS NEEDED FOR WHEEZING OR SHORTNESS OF BREATH 03/15/20  Yes Vena Austria, MD  baclofen (LIORESAL) 10 MG tablet Take 1 tablet (10 mg total) by mouth 3 (three) times daily. 05/12/20  Yes Cannady, Jolene T, NP  cholecalciferol (VITAMIN D3) 25 MCG (1000 UNIT) tablet Take 1,000 Units by mouth daily.   Yes [provider]  clonazePAM (KLONOPIN) 1 MG tablet Take 1 tablet (1 mg total) by mouth 2 (two) times daily. 12/10/19  Yes Schuman, Christanna R, MD  cloNIDine (CATAPRES) 0.3 MG tablet Take 1 tablet (0.3 mg total) by mouth daily. 12/10/19  Yes Schuman, Christanna R, MD  fenofibrate (TRICOR) 145 MG tablet Take 1 tablet (145 mg total) by mouth daily. 05/25/20  Yes  Cannady, Jolene T, NP  insulin glargine (LANTUS) 100 unit/mL SOPN Inject 0.32 mLs (32 Units total) into the skin at bedtime. 05/24/20  Yes Cannady, Jolene T, NP  insulin lispro (HUMALOG) 100 UNIT/ML KwikPen Inject 0.04-0.16 mLs (4-16 Units total) into the skin 3 (three) times daily before meals. 05/24/20  Yes Cannady, [name], NP  Insulin  Pen Needle (PEN NEEDLES) 31G X 6 MM MISC 1 each by Does not apply route 4 (four) times daily. 12/10/19  Yes Schuman, Christanna R, MD  losartan (COZAAR) 25 MG tablet Take 1 tablet (25 mg total) by mouth daily. 05/24/20  Yes Cannady, Jolene T, NP  metFORMIN (GLUCOPHAGE) 1000 MG tablet Take 1 tablet (1,000 mg total) by mouth daily. At night 05/06/20  Yes Cannady, Jolene T, NP  promethazine (PHENERGAN) 12.5 MG tablet Take 1 tablet (12.5 mg total) by mouth every 6 (six) hours as needed for nausea or vomiting. 05/12/20  Yes Cannady, Jolene T, NP  risperiDONE (RISPERDAL) 0.5 MG tablet Take 0.5 mg by mouth 2 (two) times daily. Patient not taking: Reported on 06/17/2020    [provider]    Physical Exam Vitals:  Vitals:   06/17/20 1143  BP: (!) 140/80   No LMP recorded. (Menstrual status: Irregular Periods).  General: NAD HEENT: normocephalic, anicteric Pulmonary: No increased work of breathing Neurologic: Grossly intact Psychiatric: mood appropriate, affect full    GAD 7 : Generalized Anxiety Score 05/24/2020 05/09/2020 05/06/2020  Nervous, Anxious, on Edge 1 2 3   Control/stop worrying 2 3 3   Worry too much - different things 2 2 3   Trouble relaxing 2 1 2   Restless 1 2 3   Easily annoyed or irritable 1 1 2   Afraid - awful might happen 2 2 1   Total GAD 7 Score 11 13 17   Anxiety Difficulty Somewhat difficult Somewhat difficult Somewhat difficult    Depression screen [facility] 2/9 05/24/2020 05/09/2020 05/06/2020  Decreased Interest 0 0 1  Down, Depressed, Hopeless 1 1 2   PHQ - 2 Score 1 1 3   Altered sleeping 1 0 0  Tired, decreased energy 2 2 2   Change  in appetite 1 2 2   Feeling bad or failure about yourself  2 1 1   Trouble concentrating 1 2 1   Moving slowly or fidgety/restless 2 - 1  Suicidal thoughts 0 0 0  PHQ-9 Score 10 8 10   Difficult doing work/chores Somewhat difficult Somewhat difficult Somewhat difficult    Depression screen [facility] 2/9 05/24/2020 05/09/2020 05/06/2020 01/04/2020 12/28/2019  Decreased Interest 0 0 1 0 0  Down, Depressed, Hopeless 1 1 2  0 0  PHQ - 2 Score 1 1 3  0 0  Altered sleeping 1 0 0 - -  Tired, decreased energy 2 2 2  - -  Change in appetite 1 2 2  - -  Feeling bad or failure about yourself  2 1 1  - -  Trouble concentrating 1 2 1  - -  Moving slowly or fidgety/restless 2 - 1 - -  Suicidal thoughts 0 0 0 - -  PHQ-9 Score 10 8 10  - -  Difficult doing work/chores Somewhat difficult Somewhat difficult Somewhat difficult - -     Assessment: 29 y.o. G2P1011 follow up anxiety & depression as well as AUB  Plan: Problem List Items Addressed This Visit    None    Visit Diagnoses    Anxiety and depression    -  Primary   Abnormal uterine bleeding          1) Seeing psychiatry now so will transition management of mood to them  2) Thyroid and B12 screen has not been obtained previously  3) AUB - start Lo loestrin fe given 2 months samples   , MD, OB/GYN, [facility] 06/17/2020, 11:51 AM

## 2020-06-28 ENCOUNTER — Other Ambulatory Visit: Payer: Medicaid Other

## 2020-07-01 ENCOUNTER — Other Ambulatory Visit: Payer: Self-pay

## 2020-07-01 ENCOUNTER — Other Ambulatory Visit: Payer: Medicaid Other

## 2020-07-01 DIAGNOSIS — E785 Hyperlipidemia, unspecified: Secondary | ICD-10-CM

## 2020-07-01 DIAGNOSIS — E781 Pure hyperglyceridemia: Secondary | ICD-10-CM

## 2020-07-01 DIAGNOSIS — E1169 Type 2 diabetes mellitus with other specified complication: Secondary | ICD-10-CM

## 2020-07-02 LAB — COMPREHENSIVE METABOLIC PANEL
ALT: 74 IU/L — ABNORMAL HIGH (ref 0–32)
AST: 52 IU/L — ABNORMAL HIGH (ref 0–40)
Albumin/Globulin Ratio: 1.8 (ref 1.2–2.2)
Albumin: 4.2 g/dL (ref 3.9–5.0)
Alkaline Phosphatase: 113 IU/L (ref 48–121)
BUN/Creatinine Ratio: 9 (ref 9–23)
BUN: 7 mg/dL (ref 6–20)
Bilirubin Total: <0.2 mg/dL (ref 0.0–1.2)
CO2: 22 mmol/L (ref 20–29)
Calcium: 9.7 mg/dL (ref 8.7–10.2)
Chloride: 95 mmol/L — ABNORMAL LOW (ref 96–106)
Creatinine, Ser: 0.77 mg/dL (ref 0.57–1.00)
GFR calc Af Amer: 122 mL/min/{1.73_m2} (ref >59)
GFR calc non Af Amer: 105 mL/min/{1.73_m2} (ref >59)
Globulin, Total: 2.4 g/dL (ref 1.5–4.5)
Glucose: 402 mg/dL — ABNORMAL HIGH (ref 65–99)
Potassium: 4.7 mmol/L (ref 3.5–5.2)
Sodium: 134 mmol/L (ref 134–144)
Total Protein: 6.6 g/dL (ref 6.0–8.5)

## 2020-07-02 LAB — LIPID PANEL W/O CHOL/HDL RATIO
Cholesterol, Total: 343 mg/dL — ABNORMAL HIGH (ref 100–199)
HDL: 23 mg/dL — ABNORMAL LOW (ref >39)
Triglycerides: 1287 mg/dL (ref 0–149)

## 2020-07-03 NOTE — Progress Notes (Signed)
Contacted via MyChartGood evening Baudelia, your labs have returned.  Sugar remains elevated, please keep your appointment with endocrinology this week so we can possibly work on a pump for you, which I think would offer benefit.  Kidney function is stable and sodium level improved.  Liver function continues to show mild elevation, which we will continue to monitor.  Triglycerides are still elevated, but are coming down.  Please continue Fenofibrate every day, we will recheck next visit and if still elevated will probably add on a statin for additional help at lowering these.  Any questions? Keep being awesome!! Kindest regards, Jaliel Deavers

## 2020-07-05 ENCOUNTER — Telehealth: Payer: Medicaid Other | Admitting: General Practice

## 2020-07-05 ENCOUNTER — Ambulatory Visit: Payer: Self-pay | Admitting: General Practice

## 2020-07-05 DIAGNOSIS — I152 Hypertension secondary to endocrine disorders: Secondary | ICD-10-CM

## 2020-07-05 DIAGNOSIS — E139 Other specified diabetes mellitus without complications: Secondary | ICD-10-CM

## 2020-07-05 DIAGNOSIS — F17219 Nicotine dependence, cigarettes, with unspecified nicotine-induced disorders: Secondary | ICD-10-CM

## 2020-07-05 DIAGNOSIS — E1169 Type 2 diabetes mellitus with other specified complication: Secondary | ICD-10-CM

## 2020-07-05 DIAGNOSIS — E785 Hyperlipidemia, unspecified: Secondary | ICD-10-CM

## 2020-07-05 NOTE — Patient Instructions (Signed)
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 doctor about taking medicines and seeing a counselor at the same time. You are more likely to succeed when you do both. If you are pregnant or breastfeeding, talk with your doctor about counseling or other ways to quit smoking. Do not take medicine to help you quit smoking unless your doctor tells you to do so. To quit smoking: Quit right away Quit smoking totally, instead of slowly cutting back on how much you smoke over a period of time. Go to counseling. You are more likely to quit if you go to  counseling sessions regularly. Take medicine You may take medicines to help you quit. Some medicines need a prescription, and some you can buy over-the-counter. Some medicines may contain a drug called nicotine to  replace the nicotine in cigarettes. Medicines may: Help you to stop having the desire to smoke (cravings). Help to stop the problems that come when you stop smoking (withdrawal symptoms). Your doctor may ask you to use: Nicotine patches, gum, or lozenges. Nicotine inhalers or sprays. Non-nicotine medicine that is taken by mouth. Find resources Find resources and other ways to help you quit smoking and remain smoke-free after you quit. These resources are most helpful when you use them often. They include: Online chats with a Social worker. Phone quitlines. Printed Furniture conservator/restorer. Support groups or group counseling. Text messaging programs. Mobile phone apps. Use apps on your mobile phone or tablet that can help you stick to your quit plan. There are many free apps for mobile phones and tablets as well as websites. Examples include Quit Guide from the [employer] and smokefree.gov  What things can I do to make it easier to quit?  Talk to your family and friends. Ask them to support and encourage you. Call a phone quitline (1-800-QUIT-NOW), reach out to support groups, or work with a Social worker. Ask people who smoke to not smoke around you. Avoid places that make you want to smoke, such as: Bars. Parties. Smoke-break areas at work. Spend time with people who do not smoke. Lower the stress in your life. Stress can make you want to smoke. Try these things to help your stress: Getting regular exercise. Doing deep-breathing exercises. Doing yoga. Meditating. Doing a body scan. To do this, close your eyes, focus on one area of your body at a time from head to toe. Notice which parts of your body are tense. Try to relax the muscles in those areas. How will I feel when I quit smoking? Day 1 to 3 weeks Within the first 24 hours, you may start to have some problems that come from quitting tobacco. These problems are very bad 2-3 days after you quit, but they do not often last for more than 2-3 weeks.  You may get these symptoms: Mood swings. Feeling restless, nervous, angry, or annoyed. Trouble concentrating. Dizziness. Strong desire for high-sugar foods and nicotine. Weight gain. Trouble pooping (constipation). Feeling like you may vomit (nausea). Coughing or a sore throat. Changes in how the medicines that you take for other issues work in your body. Depression. Trouble sleeping (insomnia). Week 3 and afterward After the first 2-3 weeks of quitting, you may start to notice more positive results, such as: Better sense of smell and taste. Less coughing and sore throat. Slower heart rate. Lower blood pressure. Clearer skin. Better breathing. Fewer sick days. Quitting smoking can be hard. Do not give up if you fail the first time. Some people need to try a few times before they succeed. Do your best to stick to your quit plan, and talk with your doctor if you have any questions or concerns. Summary Smoking tobacco is the leading cause of preventable death. Quitting smoking can be hard, but it is one of the best things that you can do for your health. When you decide to quit smoking, make a plan to help you succeed. Quit smoking right away, not slowly over a period of time. When you start quitting, seek help from your doctor, family, or friends. This information is not intended to replace advice given to you  by your health care provider. Make sure you discuss any questions you have with your health care provider. Document Revised: 08/07/2019 Document Reviewed: 01/31/2019 Elsevier Patient Education  [place].
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
## 2020-07-06 LAB — HM DIABETES EYE EXAM

## 2020-07-20 IMAGING — MR MR ABDOMEN WO/W CM
18 series · 47 of 48 positions shown · IV contrast (10ml Gadavist)
Comparison: CT AP 03/23/2020

CLINICAL DATA: Evaluate liver abnormality seen on recent CT.
Abdominal pain, hiatal hernia and umbilical pain.

EXAM:
MRI ABDOMEN WITHOUT AND WITH CONTRAST
TECHNIQUE: Multiplanar multisequence MR imaging of the abdomen was performed
both before and after the administration of intravenous contrast.
CONTRAST:  10mL GADAVIST GADOBUTROL 1 MMOL/ML IV SOLN

[Series 2: T2 · coronal · 6.0mm · 1.19mm/px · 2 of 30 slices shown (1 of 2)]
[im 1/30]
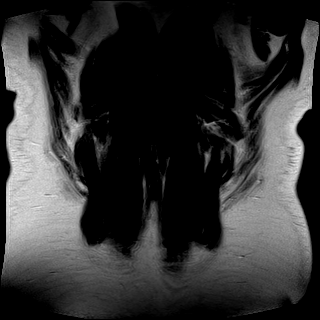
[im 30/30]
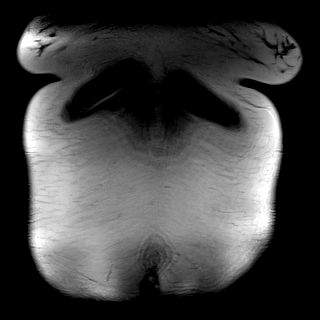

[Series 3: T2 · axial · 6.0mm · 1.19mm/px · z∈[-100,+145]mm · 2 of 35 slices shown (2 of 2)]
[im 1/35]
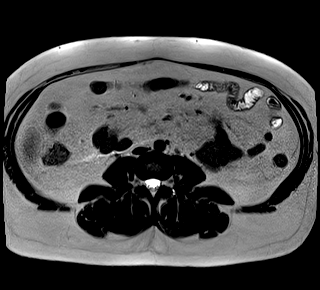
[im 35/35]
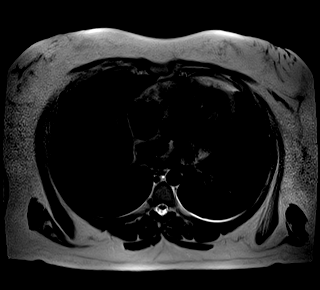

[Series 4: ax dwi_tracew · axial · 6.0mm · 1.42mm/px · z∈[-93,+145]mm · 5 of 102 slices shown]
[im 1/102]
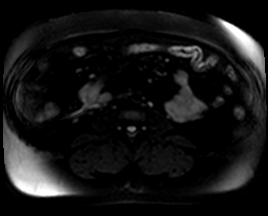
[im 26/102]
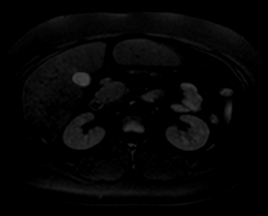
[im 51/102]
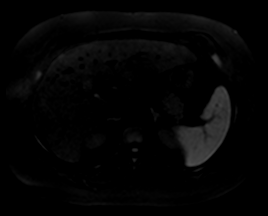
[im 76/102]
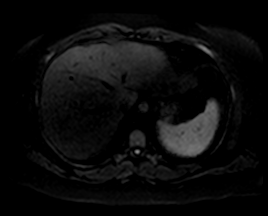
[im 102/102]
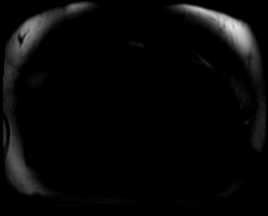

[Series 5: ax dwi_adc · axial · 6.0mm · 1.42mm/px · z∈[-93,+145]mm · 2 of 34 slices shown]
[im 1/34]
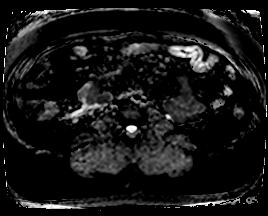
[im 34/34]
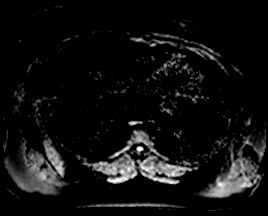

[Series 7: T2 fat-sat · axial · 6.0mm · 1.19mm/px · z∈[-93,+145]mm · 2 of 34 slices shown]
[im 1/34]
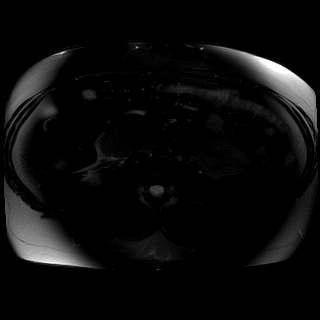
[im 34/34]
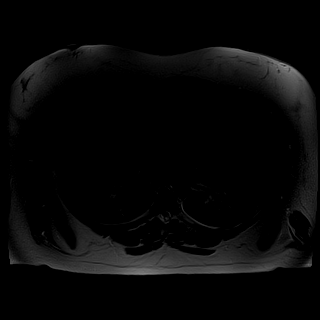

[Series 8: T1 · axial · 6.0mm · 0.74mm/px · z∈[-100,+145]mm · 2 of 35 slices shown (1 of 2)]
[im 1/35]
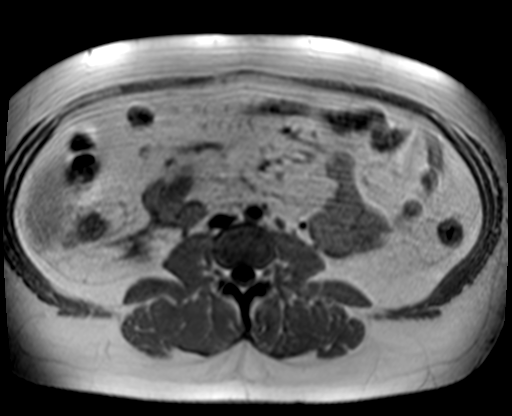
[im 35/35]
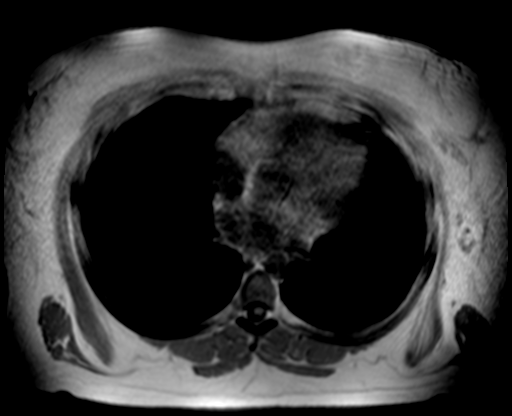

[Series 8: T1 · axial · 6.0mm · 0.74mm/px · z∈[-100,+145]mm · 2 of 35 slices shown (2 of 2)]
[im 1/35]
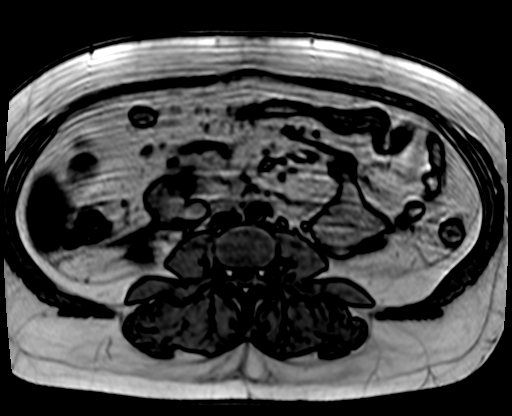
[im 35/35]
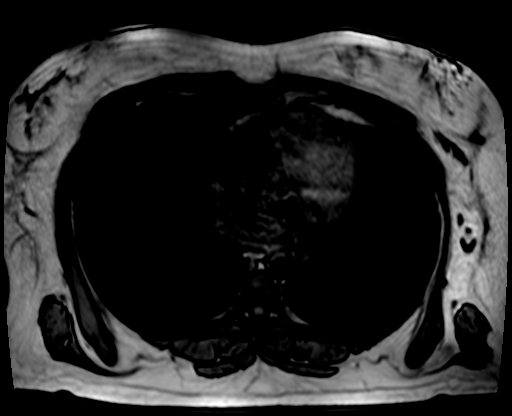

[Series 9: bSSFP · axial · 6.0mm · 0.74mm/px · 1 of 35 slices shown]
[im 1/35]
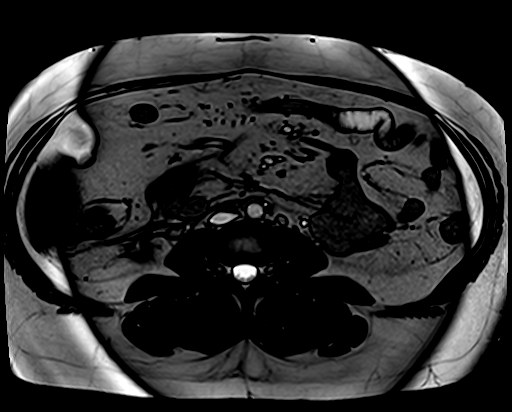

[Series 11: T1 dynamic fat-sat · axial · non-contrast · 3.0mm · 1.19mm/px · z∈[-98,+139]mm · 3 of 80 slices shown (1 of 5)]
[im 1/80]
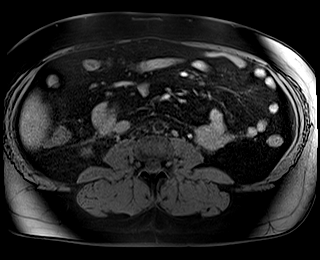
[im 40/80]
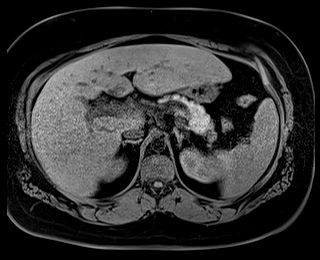
[im 80/80]
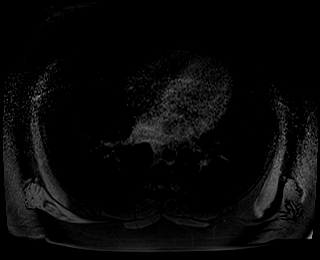

[Series 12: T1 dynamic fat-sat post-contrast · axial · 3.0mm · 1.19mm/px · z∈[-98,+139]mm · 3 of 80 slices shown (1 of 4)]
[im 1/80]
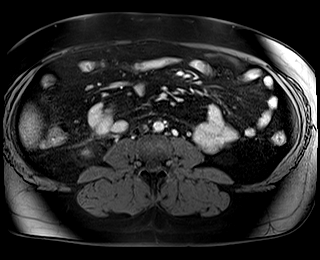
[im 40/80]
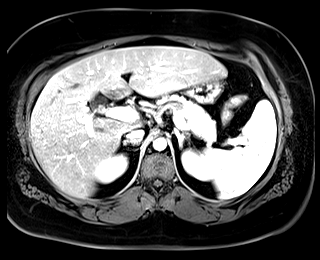
[im 80/80]
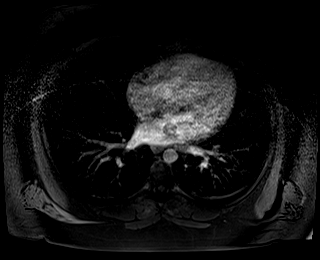

[Series 13: T1 dynamic fat-sat · axial · 3.0mm · 1.19mm/px · z∈[-98,+139]mm · 3 of 80 slices shown (2 of 5)]
[im 1/80]
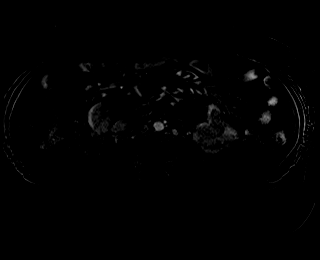
[im 40/80]
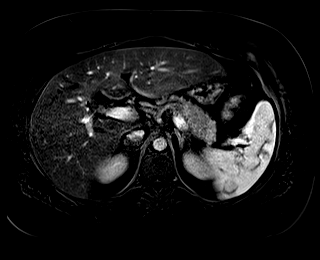
[im 80/80]
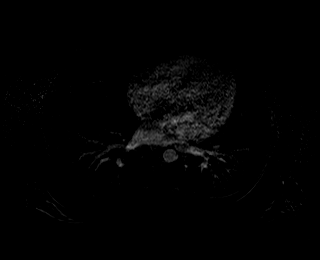

[Series 14: T1 dynamic fat-sat post-contrast · axial · 3.0mm · 1.19mm/px · z∈[-98,+139]mm · 3 of 80 slices shown (2 of 4)]
[im 1/80]
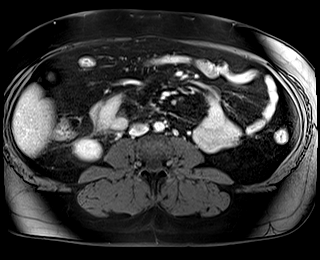
[im 40/80]
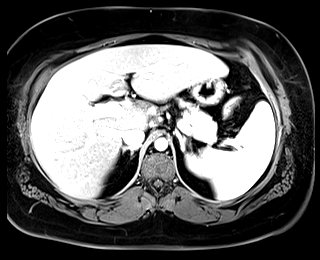
[im 80/80]
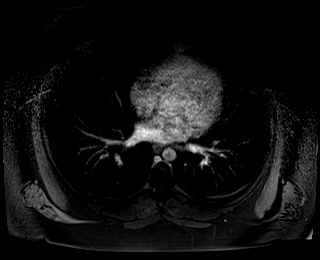

[Series 15: T1 dynamic fat-sat · axial · 3.0mm · 1.19mm/px · z∈[-98,+139]mm · 3 of 80 slices shown (3 of 5)]
[im 1/80]
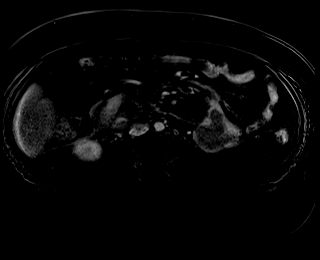
[im 40/80]
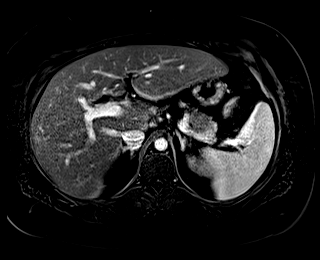
[im 80/80]
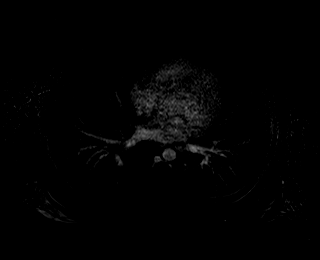

[Series 16: T1 dynamic fat-sat post-contrast · axial · 3.0mm · 1.19mm/px · z∈[-98,+139]mm · 3 of 80 slices shown (3 of 4)]
[im 1/80]
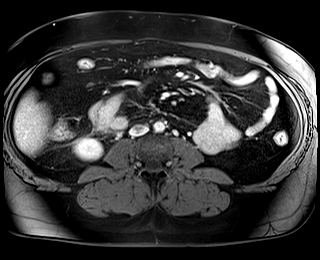
[im 40/80]
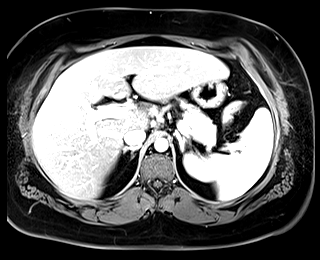
[im 80/80]
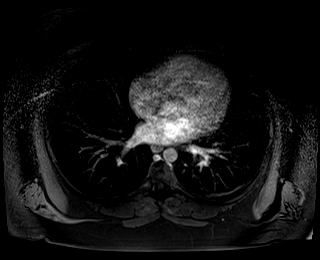

[Series 17: T1 dynamic fat-sat · axial · 3.0mm · 1.19mm/px · z∈[-98,+139]mm · 3 of 80 slices shown (4 of 5)]
[im 1/80]
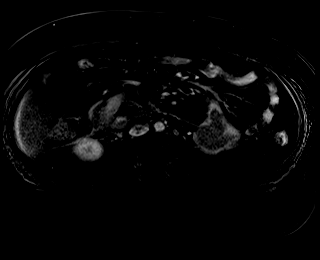
[im 40/80]
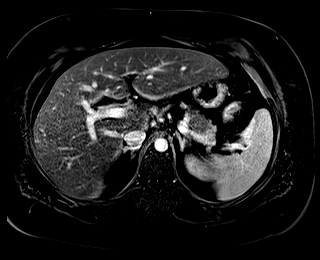
[im 80/80]
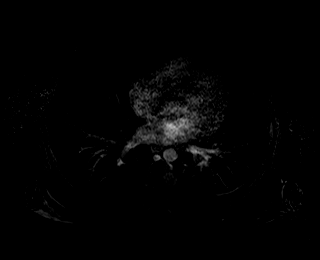

[Series 18: T1 dynamic post-contrast · coronal · 3.0mm · 1.31mm/px · 3 of 72 slices shown]
[im 1/72]
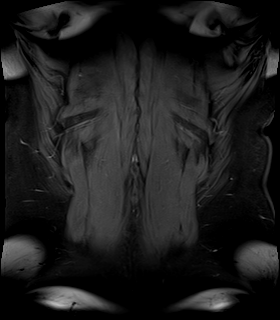
[im 36/72]
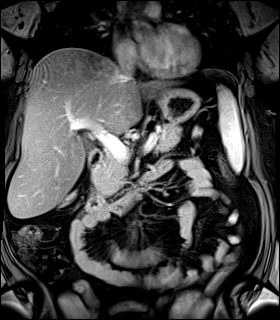
[im 72/72]
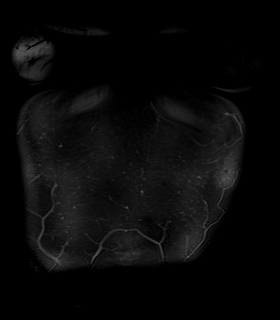

[Series 19: T1 dynamic fat-sat post-contrast · axial · 3.0mm · 1.19mm/px · z∈[-98,+139]mm · 3 of 80 slices shown (4 of 4)]
[im 1/80]
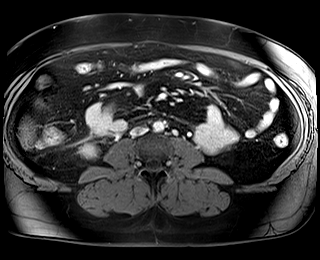
[im 40/80]
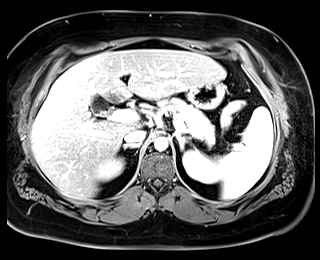
[im 80/80]
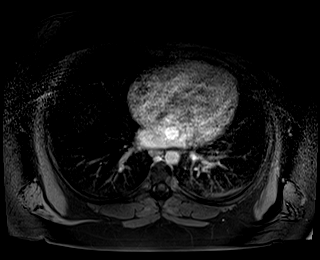

[Series 20: T1 dynamic fat-sat · axial · 3.0mm · 1.19mm/px · z∈[-98,+19]mm · 2 of 80 slices shown (5 of 5)]
[im 1/80]
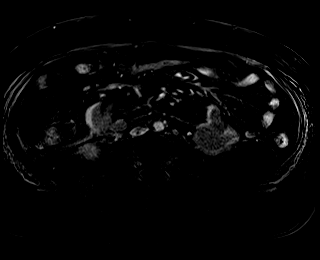
[im 40/80]
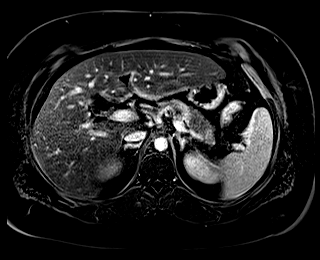

[47 of 48 positions shown; findings below may reference images not displayed]

FINDINGS: Lower chest: No acute findings.

Hepatobiliary: There is diffuse hepatic steatosis. Focal area of
more intense fatty deposition is identified within the central
aspect of segment 4a corresponding to the CT abnormality. No
suspicious enhancing liver lesions identified. 3 mm stone noted
within the dependent portion of the gallbladder. No gallbladder wall
thickening or inflammation. No biliary ductal dilatation.

Pancreas: No mass, inflammatory changes, or other parenchymal
abnormality identified.

Spleen:  Within normal limits in size and appearance.

Adrenals/Urinary Tract: Normal appearance of the adrenal glands. No
kidney mass or hydronephrosis.

Stomach/Bowel: There is mild fat stranding surrounding the second
and third portions of the duodenum, image 77/16. No duodenal wall
thickening or inflammation noted. The remaining visualized bowel
loops are unremarkable.

Vascular/Lymphatic: No pathologically enlarged lymph nodes
identified. No abdominal aortic aneurysm demonstrated.

Other:  None.

Musculoskeletal: No suspicious bone lesions identified.
IMPRESSION: 1. Diffuse hepatic steatosis with more focal fatty deposition within
segment 4 a of liver accounting for the CT abnormality. No
suspicious liver lesions identified at this time.
2. Soft tissue stranding is incidentally noted surrounding the
second and third portions of the duodenum, nonspecific but may be
seen with duodenitis. Clinical correlation advised
3. Gallstone.

## 2020-07-21 ENCOUNTER — Other Ambulatory Visit: Payer: Self-pay | Admitting: Nurse Practitioner

## 2020-07-21 MED ORDER — AMOXICILLIN 500 MG PO CAPS: 500.0000 mg | ORAL_CAPSULE | Freq: Three times a day (TID) | ORAL | 0 refills | Status: AC

## 2020-07-25 ENCOUNTER — Ambulatory Visit: Payer: Medicaid Other | Admitting: Licensed Clinical Social Worker

## 2020-07-25 DIAGNOSIS — I152 Hypertension secondary to endocrine disorders: Secondary | ICD-10-CM
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
## 2020-08-02 ENCOUNTER — Ambulatory Visit: Payer: Self-pay | Admitting: General Practice

## 2020-08-02 ENCOUNTER — Telehealth: Payer: Medicaid Other | Admitting: General Practice

## 2020-08-02 DIAGNOSIS — E1159 Type 2 diabetes mellitus with other circulatory complications: Secondary | ICD-10-CM

## 2020-08-02 DIAGNOSIS — E139 Other specified diabetes mellitus without complications: Secondary | ICD-10-CM

## 2020-08-02 DIAGNOSIS — I152 Hypertension secondary to endocrine disorders: Secondary | ICD-10-CM

## 2020-08-02 DIAGNOSIS — F17219 Nicotine dependence, cigarettes, with unspecified nicotine-induced disorders: Secondary | ICD-10-CM

## 2020-08-02 DIAGNOSIS — E1169 Type 2 diabetes mellitus with other specified complication: Secondary | ICD-10-CM

## 2020-08-02 DIAGNOSIS — F600 Paranoid personality disorder: Secondary | ICD-10-CM

## 2020-08-02 DIAGNOSIS — E785 Hyperlipidemia, unspecified: Secondary | ICD-10-CM
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
## 2020-08-03 ENCOUNTER — Telehealth: Payer: Self-pay | Admitting: Nurse Practitioner

## 2020-08-03 NOTE — Telephone Encounter (Signed)
Joy [name] 08/03/2020 Called pt regarding community resource referral received. Left message for pt to call me back, my info is [phone] please see ref notes for more details.  Joy [name] Care Guide, Embedded Care Coordination [place], Care Management    

## 2020-08-05 ENCOUNTER — Telehealth: Payer: Self-pay | Admitting: Nurse Practitioner

## 2020-08-05 NOTE — Telephone Encounter (Signed)
Joy [name] 08/05/2020 Called pt regarding community resource referral received. Left message for pt to call me back, my info is [phone] please see ref notes for more details.  Joy [name] Care Guide, Embedded Care Coordination [facility], Care Management

## 2020-08-15 ENCOUNTER — Telehealth: Payer: Medicaid Other

## 2020-08-15 ENCOUNTER — Telehealth: Payer: Self-pay | Admitting: Licensed Clinical Social Worker

## 2020-08-15 NOTE — Telephone Encounter (Signed)
  Chronic Care Management    Clinical Social Work General Follow Up Note  08/15/2020 Name: Joy [name] MRN: [id number] DOB: 10/04/1991  Joy [name] is a 29 y.o. year old female who is a primary care patient of Cannady, [name], NP. The CCM team was consulted for assistance with Mental Health Counseling and Resources.   Review of patient status, including review of consultants reports, relevant laboratory and other test results, and collaboration with appropriate care team members and the patient's provider was performed as part of comprehensive patient evaluation and provision of chronic care management services.    LCSW completed CCM outreach attempt today but was unable to reach patient successfully. A HIPPA compliant voice message was left encouraging patient to return call once available. LCSW will ask Scheduling Care Guide to reschedule CCM SW appointment with patient as well.   Outpatient Encounter Medications as of 08/15/2020  Medication Sig  . ACCU-CHEK GUIDE test strip TEST BLOOD SUGAR 4 TIMES A DAY  . albuterol (VENTOLIN HFA) 108 (90 Base) MCG/ACT inhaler INHALE 2 PUFFS BY MOUTH EVERY 4 HOURS AS NEEDED FOR WHEEZING OR SHORTNESS OF BREATH  . baclofen (LIORESAL) 10 MG tablet Take 1 tablet (10 mg total) by mouth 3 (three) times daily.  . cholecalciferol (VITAMIN D3) 25 MCG (1000 UNIT) tablet Take 1,000 Units by mouth daily.  . clonazePAM (KLONOPIN) 1 MG tablet Take 1 tablet (1 mg total) by mouth 2 (two) times daily.  . cloNIDine (CATAPRES) 0.3 MG tablet Take 1 tablet (0.3 mg total) by mouth daily.  . fenofibrate (TRICOR) 145 MG tablet Take 1 tablet (145 mg total) by mouth daily.  . insulin glargine (LANTUS) 100 unit/mL SOPN Inject 0.32 mLs (32 Units total) into the skin at bedtime.  . insulin lispro (HUMALOG) 100 UNIT/ML KwikPen Inject 0.04-0.16 mLs (4-16 Units total) into the skin 3 (three) times daily before meals.  . Insulin Pen Needle (PEN NEEDLES) 31G X 6 MM MISC 1 each by  Does not apply route 4 (four) times daily.  [name] losartan (COZAAR) 25 MG tablet Take 1 tablet (25 mg total) by mouth daily.  . metFORMIN (GLUCOPHAGE) 1000 MG tablet Take 1 tablet (1,000 mg total) by mouth daily. At night  . promethazine (PHENERGAN) 12.5 MG tablet Take 1 tablet (12.5 mg total) by mouth every 6 (six) hours as needed for nausea or vomiting.  . risperiDONE (RISPERDAL) 0.5 MG tablet Take 0.5 mg by mouth 2 (two) times daily. (Patient not taking: Reported on 06/17/2020)   No facility-administered encounter medications on file as of 08/15/2020.    Follow Up Plan: Scheduling Care Guide will reach out to patient to reschedule appointment.   Dickie La, BSW, MSW, LCSW [facility] Care Management [place]  [facility] [email or web address] Phone: [phone]

## 2020-08-17 ENCOUNTER — Ambulatory Visit: Payer: Medicaid Other | Admitting: Gastroenterology

## 2020-08-18 NOTE — Telephone Encounter (Signed)
Pt has been r/s 09/30/2020 

## 2020-08-19 ENCOUNTER — Telehealth: Payer: Self-pay | Admitting: Nurse Practitioner

## 2020-08-23 ENCOUNTER — Telehealth: Payer: Self-pay | Admitting: Nurse Practitioner

## 2020-08-23 NOTE — Telephone Encounter (Signed)
Joy [name] 08/23/2020 Called pt regarding community resource referral received. Left message for pt to call me back, my info is [phone] please see ref notes for more details.  Joy [name] Care Guide, Embedded Care Coordination [facility], Care Management

## 2020-09-05 ENCOUNTER — Telehealth: Payer: Self-pay | Admitting: Pharmacist

## 2020-09-05 NOTE — Telephone Encounter (Signed)
-----   Message from Regis Bill sent at 08/25/2020  9:28 AM EDT -----  ----- Message ----- From: Lajean Manes, [facility] Sent: 08/24/2020  12:40 PM EDT To: Alethia Berthold, RMA, Regis Bill  Amber,  Can you get patient on my schedule?  I got this note from Pam:  [name], [facility] Hi Raynelle Fanning,  Please get this patient on your schedule. Need assistance with DM management. Blood sugars are still in 300's and 400's.  Thanks,  Pablo Lawrence, Do you think you could do a disease state call for her before I see her to determine if there's cost barrier or adherence issue?   Thanks

## 2020-09-05 NOTE — Chronic Care Management (AMB) (Signed)
Open in Error  Candice Thomas, CMA Clinical Pharmicist Assistant [phone]  

## 2020-09-09 ENCOUNTER — Ambulatory Visit: Payer: Medicaid Other | Admitting: Pharmacist

## 2020-09-09 NOTE — Progress Notes (Signed)
Chronic Care Management Pharmacy  Name: Joy [name]  MRN: [id number] DOB: 01-08-1991   Chief Complaint/ HPI  Joy [name],  29 y.o. , female presents for their Initial CCM visit with the clinical pharmacist via telephone.  PCP : [name], NP Patient Care Team: [name], NP as PCP - General (Nurse Practitioner) Marlowe Sax, RN as Case Manager (General Practice) Gustavus Bryant, LCSW as Social Worker (Licensed Clinical Social Worker)  The pharmacy team has been asked to assist help in managing  Diabetes   Office Visits: 05/24/20- Aura Dials, NP - lab work, amoxicillin 500mg  tid, losartan, increase lantus and lispro, TG=[id number]  Consult Visit: 07/07/20- Dr. 09/06/20- increase Lantus, Farxiga, Freestyle libre, stop soda and juice,exericse, C--peptide and antibodies next visit. T1 vs T2  Allergies  Allergen Reactions   Liraglutide     Drug induced acute pancreatitis.    Flexeril [Cyclobenzaprine]     Mood changes   Flu Virus Vaccine Hives    Lost range of motion for about 3 months   Gabapentin Other (See Comments)    Mood changes   Invega [Paliperidone Er] Nausea And Vomiting   Latex Hives   Manganese Trace Metal Additives [Manganese] Rash   Other Rash    Medications: Outpatient Encounter Medications as of 09/09/2020  Medication Sig   ACCU-CHEK GUIDE test strip TEST BLOOD SUGAR 4 TIMES A DAY   albuterol (VENTOLIN HFA) 108 (90 Base) MCG/ACT inhaler INHALE 2 PUFFS BY MOUTH EVERY 4 HOURS AS NEEDED FOR WHEEZING OR SHORTNESS OF BREATH   baclofen (LIORESAL) 10 MG tablet Take 1 tablet (10 mg total) by mouth 3 (three) times daily.   cholecalciferol (VITAMIN D3) 25 MCG (1000 UNIT) tablet Take 1,000 Units by mouth daily.   clonazePAM (KLONOPIN) 1 MG tablet Take 1 tablet (1 mg total) by mouth 2 (two) times daily.   cloNIDine (CATAPRES) 0.3 MG tablet Take 1 tablet (0.3 mg total) by mouth daily.   fenofibrate (TRICOR) 145 MG tablet Take 1  tablet (145 mg total) by mouth daily.   insulin glargine (LANTUS) 100 unit/mL SOPN Inject 0.32 mLs (32 Units total) into the skin at bedtime.   insulin lispro (HUMALOG) 100 UNIT/ML KwikPen Inject 0.04-0.16 mLs (4-16 Units total) into the skin 3 (three) times daily before meals.   Insulin Pen Needle (PEN NEEDLES) 31G X 6 MM MISC 1 each by Does not apply route 4 (four) times daily.   losartan (COZAAR) 25 MG tablet Take 1 tablet (25 mg total) by mouth daily.   metFORMIN (GLUCOPHAGE) 1000 MG tablet Take 1 tablet (1,000 mg total) by mouth daily. At night   promethazine (PHENERGAN) 12.5 MG tablet Take 1 tablet (12.5 mg total) by mouth every 6 (six) hours as needed for nausea or vomiting.   risperiDONE (RISPERDAL) 0.5 MG tablet Take 0.5 mg by mouth 2 (two) times daily. (Patient not taking: Reported on 06/17/2020)   No facility-administered encounter medications on file as of 09/09/2020.    Wt Readings from Last 3 Encounters:  06/17/20 (!) 210 lb (95.3 kg)  05/24/20 207 lb (93.9 kg)  05/16/20 211 lb 4 oz (95.8 kg)    Current Diagnosis/Assessment:    Goals Addressed   None     Diabetes   A1c goal <7%  Recent Relevant Labs: Lab Results  Component Value Date/Time   HGBA1C 10.4 (H) 05/24/2020 11:24 AM   HGBA1C 6.9 (H) 12/10/2019 04:27 PM   MICROALBUR 80 (H) 05/24/2020 11:24 AM  Last diabetic Eye exam:  Lab Results  Component Value Date/Time   HMDIABEYEEXA No Retinopathy 1992[birth year]10[birth year]01 12:00 AM    Last diabetic Foot exam: No results found for: HMDIABFOOTEX   Checking BG: 4-5 times per day. Patient has freestyle libre 2  Recent FBG Readings: 160-250  Recent pre-meal BG readings: 120- 250s  Recent 2hr PP BG readings:  200 Recent HS BG readings: high 100s low 200s  Patient has failed these meds in past: NA Patient is currently uncontrolled on the following medications:  lantus 45 units daily  Lispro 10-18 units with meals  Farxiga 10 mg daily  We discussed: Patient  reports she has [employer] 2 and had trouble keeping it on, received new sensors about a month ago and has been using consistently. Reports two episodes of hypoglycemia overnight with readings reported at 61 and 56. She states this has not occurred since changing her Farxiga dose to bedtime. She can not correlate to dietary factors and unable to provide much detail regarding diet. Typically eats 2 meals and 2 snacks and has been eating a lot of canned tomato soup recently. She drinks 3-4 regular sodas and 2 glasses of juice daily. We reviewed diet and exercise recommendations. Encouraged her to schedule follow up with endocrinologist and discuss insulin pump Plan  Continue current medications     Mercer Pod. Tiburcio Pea PharmD, BCPS Clinical Pharmacist [facility] [phone]

## 2020-09-27 ENCOUNTER — Telehealth: Payer: Medicaid Other

## 2020-09-27 ENCOUNTER — Telehealth: Payer: Self-pay | Admitting: General Practice

## 2020-09-27 NOTE — Telephone Encounter (Signed)
  Chronic Care Management   Outreach Note  09/27/2020 Name: Joy [name] MRN: [id number] DOB: 1990/12/04  Referred by: [name], NP Reason for referral : Care Coordination (RNCM Chronic Disease Managment and Care Coordination Needs)   An unsuccessful telephone outreach was attempted today. The patient was referred to the case management team for assistance with care management and care coordination. The patient states that she was at the store and could not talk right now. Ask for a call back at another time.   Follow Up Plan: The care management team will reach out to the patient again over the next 30 to 60 days.   Alto Denver RN, MSN, CCM Community Care Coordinator [place]  [facility] Mobile: [phone]

## 2020-09-30 ENCOUNTER — Ambulatory Visit: Payer: Medicaid Other | Admitting: Licensed Clinical Social Worker
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
## 2020-10-04 ENCOUNTER — Telehealth: Payer: Self-pay | Admitting: Nurse Practitioner

## 2020-10-04 NOTE — Telephone Encounter (Signed)
  Care Management   Note  10/04/2020 Name: Joy [name] MRN: [id number] DOB: [birth year]  Joy [name] is a 29 y.o. year old female who is a primary care patient of [name], NP and is actively engaged with the care management team. I reached out to [name] by phone today to assist with re-scheduling a follow up visit with the RN Case Manager  Follow up plan: Unsuccessful telephone outreach attempt made. A HIPAA compliant phone message was left for the patient providing contact information and requesting a return call.  We have been unable to make contact with the patient for follow up. The care management team is available to follow up with the patient after provider conversation with the patient regarding recommendation for care management engagement and subsequent re-referral to the care management team.  If patient returns call to provider office, please advise to call Embedded Care Management Care Guide Penne Lash at [phone].  Rojelio Brenner Care Guide, Embedded Care Coordination [facility], Care Management

## 2020-10-24 ENCOUNTER — Telehealth: Payer: Self-pay

## 2020-10-24 NOTE — Chronic Care Management (AMB) (Signed)
  Care Management   Note  10/24/2020 Name: Joy [name] MRN: [id number] DOB: 05-22-1991  Joy [name] is a 29 y.o. year old female who is a primary care patient of [name], NP and is actively engaged with the care management team. I reached out to [name] by phone today to assist with re-scheduling a follow up visit with the RN Case Manager  Follow up plan: Unsuccessful telephone outreach attempt made. A HIPAA compliant phone message was left for the patient providing contact information and requesting a return call.  The care management team will reach out to the patient again over the next 7 days.  If patient returns call to provider office, please advise to call Embedded Care Management Care Guide Penne Lash  at [phone]  Penne Lash, RMA Care Guide, Embedded Care Coordination [facility]  [place], [place] [address] Direct Dial: [phone] [email or web address] Website: [email or web address]

## 2020-12-01 NOTE — Telephone Encounter (Signed)
Patient has been rescheduled.

## 2020-12-01 NOTE — Chronic Care Management (AMB) (Signed)
  Care Management   Note  12/01/2020 Name: Joy [name] MRN: [id number] DOB: 21-Apr-1991  Joy [name] is a 30 y.o. year old female who is a primary care patient of [name], NP and is actively engaged with the care management team. I reached out to [name] by phone today to assist with re-scheduling a follow up visit with the RN Case Manager  Follow up plan: Telephone appointment with care management team member scheduled for:01/03/2021  Penne Lash, RMA Care Guide, Embedded Care Coordination [facility]  [place], [place] [address] Direct Dial: [phone] [email or web address]: [email or web address]

## 2020-12-09 ENCOUNTER — Ambulatory Visit: Payer: Medicaid Other | Admitting: Licensed Clinical Social Worker
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
## 2021-01-01 ENCOUNTER — Other Ambulatory Visit: Payer: Self-pay | Admitting: Nurse Practitioner

## 2021-01-01 NOTE — Telephone Encounter (Signed)
Requested medication (s) are due for refill today - unsure  Requested medication (s) are on the active medication list -yes  Future visit scheduled -no  Last refill: 07/28/20  Notes to clinic: Request RF non delegated rx  Requested Prescriptions  Pending Prescriptions Disp Refills   baclofen (LIORESAL) 10 MG tablet [Pharmacy Med Name: Baclofen 10 MG Oral Tablet] 90 tablet 0    Sig: TAKE 1 TABLET BY MOUTH THREE TIMES DAILY      Not Delegated - Analgesics:  Muscle Relaxants Failed - 01/01/2021  2:53 PM      Failed - This refill cannot be delegated      Failed - Valid encounter within last 6 months    Recent Outpatient Visits           7 months ago Diabetes 1.5, managed as type 1 (HCC)   [facility] Cannady, [name], NP   8 months ago Encounter to establish care   [facility] [place], Corrie Dandy T, NP                    Requested Prescriptions  Pending Prescriptions Disp Refills   baclofen (LIORESAL) 10 MG tablet [Pharmacy Med Name: Baclofen 10 MG Oral Tablet] 90 tablet 0    Sig: TAKE 1 TABLET BY MOUTH THREE TIMES DAILY      Not Delegated - Analgesics:  Muscle Relaxants Failed - 01/01/2021  2:53 PM      Failed - This refill cannot be delegated      Failed - Valid encounter within last 6 months    Recent Outpatient Visits           7 months ago Diabetes 1.5, managed as type 1 (HCC)   [facility] Cannady, [name], NP   8 months ago Encounter to establish care   [facility] [place], [name], NP

## 2021-01-02 NOTE — Telephone Encounter (Signed)
Routing to provider  

## 2021-01-03 ENCOUNTER — Telehealth: Payer: Medicaid Other

## 2021-01-03 ENCOUNTER — Telehealth: Payer: Self-pay

## 2021-01-03 NOTE — Chronic Care Management (AMB) (Signed)
  Care Management   Note  01/03/2021 Name: [name] MRN: [id number] DOB: 05-22-1991  Joy [name] is a 30 y.o. year old female who is a primary care patient of [name], NP and is actively engaged with the care management team. I reached out to [name] by phone today to assist with re-scheduling a follow up visit with the RN Case Manager  Follow up plan: Unsuccessful telephone outreach attempt made. A HIPAA compliant phone message was left for the patient providing contact information and requesting a return call.  The care management team will reach out to the patient again over the next 7 days.  If patient returns call to provider office, please advise to call Embedded Care Management Care Guide Penne Lash  at [phone]  Penne Lash, RMA Care Guide, Embedded Care Coordination [facility]  [place], [place] [address] Direct Dial: [phone] [email or web address]: [email or web address]

## 2021-01-12 ENCOUNTER — Ambulatory Visit: Payer: Medicaid Other | Admitting: Licensed Clinical Social Worker
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
## 2021-02-13 ENCOUNTER — Ambulatory Visit: Payer: Medicaid Other | Admitting: Licensed Clinical Social Worker

## 2021-02-13 DIAGNOSIS — E1159 Type 2 diabetes mellitus with other circulatory complications: Secondary | ICD-10-CM

## 2021-02-13 DIAGNOSIS — E785 Hyperlipidemia, unspecified: Secondary | ICD-10-CM

## 2021-02-13 DIAGNOSIS — I152 Hypertension secondary to endocrine disorders: Secondary | ICD-10-CM

## 2021-02-13 DIAGNOSIS — E1169 Type 2 diabetes mellitus with other specified complication: Secondary | ICD-10-CM
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
## 2021-02-22 NOTE — Chronic Care Management (AMB) (Signed)
  Care Management   Note  02/22/2021 Name: [name] MRN: [id number] DOB: 1991/02/04  Joy [name] is a 30 y.o. year old female who is a primary care patient of [name], NP and is actively engaged with the care management team. I reached out to [name] by phone today to assist with re-scheduling a follow up visit with the RN Case Manager  Follow up plan: Telephone appointment with care management team member scheduled for:03/28/2021  Penne Lash, RMA Care Guide, Embedded Care Coordination [facility]  [place], [place] [address] Direct Dial: [phone] [name]: [email or web address]

## 2021-02-23 ENCOUNTER — Other Ambulatory Visit: Payer: Self-pay | Admitting: Obstetrics and Gynecology

## 2021-02-28 ENCOUNTER — Other Ambulatory Visit: Payer: Self-pay | Admitting: Nurse Practitioner

## 2021-02-28 NOTE — Telephone Encounter (Signed)
Requested medication (s) are due for refill today: no  Requested medication (s) are on the active medication list: yes  Last refill:  10/25/2020  Future visit scheduled:no  Notes to clinic:  this refill cannot be delegated    Requested Prescriptions  Pending Prescriptions Disp Refills   promethazine (PHENERGAN) 12.5 MG tablet [Pharmacy Med Name: Promethazine HCl 12.5 MG Oral Tablet] 90 tablet 0    Sig: TAKE 1 TABLET BY MOUTH EVERY 6 HOURS AS NEEDED FOR NAUSEA AND VOMITING      Not Delegated - Gastroenterology: Antiemetics Failed - 02/28/2021  8:19 AM      Failed - This refill cannot be delegated      Failed - Valid encounter within last 6 months    Recent Outpatient Visits           9 months ago Diabetes 1.5, managed as type 1 (HCC)   [facility] Cannady, [name], NP   9 months ago Encounter to establish care   [facility] Morton, [name], NP

## 2021-02-28 NOTE — Telephone Encounter (Signed)
Routing to provider  

## 2021-03-20 ENCOUNTER — Other Ambulatory Visit: Payer: Self-pay | Admitting: Obstetrics and Gynecology

## 2021-03-28 ENCOUNTER — Other Ambulatory Visit: Payer: Self-pay

## 2021-03-28 ENCOUNTER — Ambulatory Visit (INDEPENDENT_AMBULATORY_CARE_PROVIDER_SITE_OTHER): Payer: Medicaid Other | Admitting: Obstetrics and Gynecology

## 2021-03-28 ENCOUNTER — Encounter: Payer: Self-pay | Admitting: Obstetrics and Gynecology

## 2021-03-28 ENCOUNTER — Telehealth: Payer: Medicaid Other

## 2021-03-28 VITALS — BP 128/84 | Ht 65.0 in | Wt 186.0 lb

## 2021-03-28 DIAGNOSIS — L02211 Cutaneous abscess of abdominal wall: Secondary | ICD-10-CM

## 2021-03-28 DIAGNOSIS — N3941 Urge incontinence: Secondary | ICD-10-CM

## 2021-03-28 MED ORDER — SULFAMETHOXAZOLE-TRIMETHOPRIM 800-160 MG PO TABS: 1.0000 | ORAL_TABLET | Freq: Two times a day (BID) | ORAL | 0 refills | Status: AC
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
## 2021-03-31 ENCOUNTER — Other Ambulatory Visit: Payer: Self-pay | Admitting: Nurse Practitioner

## 2021-03-31 ENCOUNTER — Ambulatory Visit: Payer: Medicaid Other | Admitting: Licensed Clinical Social Worker

## 2021-03-31 MED ORDER — INSULIN LISPRO (1 UNIT DIAL) 100 UNIT/ML (KWIKPEN): 4.0000 [IU] | PEN_INJECTOR | Freq: Three times a day (TID) | SUBCUTANEOUS | 11 refills | Status: DC

## 2021-04-02 LAB — WOUND CULTURE
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
## 2021-04-05 ENCOUNTER — Other Ambulatory Visit: Payer: Self-pay

## 2021-04-05 ENCOUNTER — Ambulatory Visit (INDEPENDENT_AMBULATORY_CARE_PROVIDER_SITE_OTHER): Payer: Medicaid Other | Admitting: Nurse Practitioner

## 2021-04-05 ENCOUNTER — Encounter: Payer: Self-pay | Admitting: Nurse Practitioner

## 2021-04-05 VITALS — BP 116/76 | HR 98 | Temp 99.0°F | Wt 189.0 lb

## 2021-04-05 DIAGNOSIS — F445 Conversion disorder with seizures or convulsions: Secondary | ICD-10-CM

## 2021-04-05 DIAGNOSIS — I152 Hypertension secondary to endocrine disorders: Secondary | ICD-10-CM

## 2021-04-05 DIAGNOSIS — E1169 Type 2 diabetes mellitus with other specified complication: Secondary | ICD-10-CM | POA: Diagnosis not present

## 2021-04-05 DIAGNOSIS — E781 Pure hyperglyceridemia: Secondary | ICD-10-CM | POA: Insufficient documentation

## 2021-04-05 DIAGNOSIS — F600 Paranoid personality disorder: Secondary | ICD-10-CM

## 2021-04-05 DIAGNOSIS — Z6831 Body mass index (BMI) 31.0-31.9, adult: Secondary | ICD-10-CM

## 2021-04-05 DIAGNOSIS — E6609 Other obesity due to excess calories: Secondary | ICD-10-CM

## 2021-04-05 DIAGNOSIS — E1159 Type 2 diabetes mellitus with other circulatory complications: Secondary | ICD-10-CM | POA: Diagnosis not present

## 2021-04-05 DIAGNOSIS — E785 Hyperlipidemia, unspecified: Secondary | ICD-10-CM

## 2021-04-05 DIAGNOSIS — F845 Asperger's syndrome: Secondary | ICD-10-CM

## 2021-04-05 DIAGNOSIS — E139 Other specified diabetes mellitus without complications: Secondary | ICD-10-CM

## 2021-04-05 DIAGNOSIS — E079 Disorder of thyroid, unspecified: Secondary | ICD-10-CM

## 2021-04-05 DIAGNOSIS — F17219 Nicotine dependence, cigarettes, with unspecified nicotine-induced disorders: Secondary | ICD-10-CM

## 2021-04-05 MED ORDER — FENOFIBRATE 145 MG PO TABS: 145.0000 mg | ORAL_TABLET | Freq: Every day | ORAL | 4 refills | Status: AC

## 2021-04-05 MED ORDER — BACLOFEN 20 MG PO TABS: 20.0000 mg | ORAL_TABLET | Freq: Three times a day (TID) | ORAL | 4 refills | Status: AC

## 2021-04-05 MED ORDER — INSULIN GLARGINE 100 UNITS/ML SOLOSTAR PEN: 45.0000 [IU] | PEN_INJECTOR | Freq: Every day | SUBCUTANEOUS | 4 refills | Status: DC

## 2021-04-05 MED ORDER — LOSARTAN POTASSIUM 25 MG PO TABS: 25.0000 mg | ORAL_TABLET | Freq: Every day | ORAL | 4 refills | Status: AC

## 2021-04-05 MED ORDER — METFORMIN HCL 1000 MG PO TABS: 1000.0000 mg | ORAL_TABLET | Freq: Every day | ORAL | 4 refills | Status: AC

## 2021-04-05 NOTE — Assessment & Plan Note (Signed)
I have recommended complete cessation of tobacco use. I have discussed various options available for assistance with tobacco cessation including over the counter methods (Nicotine gum, patch and lozenges). We also discussed prescription options (Chantix, Nicotine Inhaler / Nasal Spray). The patient is not interested in pursuing any prescription tobacco cessation options at this time.  

## 2021-04-05 NOTE — Patient Instructions (Signed)
Diabetes Mellitus and Nutrition, Adult When you have diabetes, or diabetes mellitus, it is very important to have healthy eating habits because your blood sugar (glucose) levels are greatly affected by what you eat and drink. Eating healthy foods in the right amounts, at about the same times every day, can help you:  Control your blood glucose.  Lower your risk of heart disease.  Improve your blood pressure.  Reach or maintain a healthy weight. What can affect my meal plan? Every person with diabetes is different, and each person has different needs for a meal plan. Your health care provider may recommend that you work with a dietitian to make a meal plan that is best for you. Your meal plan may vary depending on factors such as:  The calories you need.  The medicines you take.  Your weight.  Your blood glucose, blood pressure, and cholesterol levels.  Your activity level.  Other health conditions you have, such as heart or kidney disease. How do carbohydrates affect me? Carbohydrates, also called carbs, affect your blood glucose level more than any other type of food. Eating carbs naturally raises the amount of glucose in your blood. Carb counting is a method for keeping track of how many carbs you eat. Counting carbs is important to keep your blood glucose at a healthy level, especially if you use insulin or take certain oral diabetes medicines. It is important to know how many carbs you can safely have in each meal. This is different for every person. Your dietitian can help you calculate how many carbs you should have at each meal and for each snack. How does alcohol affect me? Alcohol can cause a sudden decrease in blood glucose (hypoglycemia), especially if you use insulin or take certain oral diabetes medicines. Hypoglycemia can be a life-threatening condition. Symptoms of hypoglycemia, such as sleepiness, dizziness, and confusion, are similar to symptoms of having too much  alcohol.  Do not drink alcohol if: ? Your health care provider tells you not to drink. ? You are pregnant, may be pregnant, or are planning to become pregnant.  If you drink alcohol: ? Do not drink on an empty stomach. ? Limit how much you use to:  0-1 drink a day for women.  0-2 drinks a day for men. ? Be aware of how much alcohol is in your drink. In the [place] one drink equals one 12 oz bottle of beer (355 mL), one 5 oz glass of wine (148 mL), or one 1 oz glass of hard liquor (44 mL). ? Keep yourself hydrated with water, diet soda, or unsweetened iced tea.  Keep in mind that regular soda, juice, and other mixers may contain a lot of sugar and must be counted as carbs. What are tips for following this plan? Reading food labels  Start by checking the serving size on the "Nutrition Facts" label of packaged foods and drinks. The amount of calories, carbs, fats, and other nutrients listed on the label is based on one serving of the item. Many items contain more than one serving per package.  Check the total grams (g) of carbs in one serving. You can calculate the number of servings of carbs in one serving by dividing the total carbs by 15. For example, if a food has 30 g of total carbs per serving, it would be equal to 2 servings of carbs.  Check the number of grams (g) of saturated fats and trans fats in one serving. Choose foods that have   a low amount or none of these fats.  Check the number of milligrams (mg) of salt (sodium) in one serving. Most people should limit total sodium intake to less than 2,300 mg per day.  Always check the nutrition information of foods labeled as "low-fat" or "nonfat." These foods may be higher in added sugar or refined carbs and should be avoided.  Talk to your dietitian to identify your daily goals for nutrients listed on the label. Shopping  Avoid buying canned, pre-made, or processed foods. These foods tend to be high in fat, sodium, and added  sugar.  Shop around the outside edge of the grocery store. This is where you will most often find fresh fruits and vegetables, bulk grains, fresh meats, and fresh dairy. Cooking  Use low-heat cooking methods, such as baking, instead of high-heat cooking methods like deep frying.  Cook using healthy oils, such as olive, canola, or sunflower oil.  Avoid cooking with butter, cream, or high-fat meats. Meal planning  Eat meals and snacks regularly, preferably at the same times every day. Avoid going long periods of time without eating.  Eat foods that are high in fiber, such as fresh fruits, vegetables, beans, and whole grains. Talk with your dietitian about how many servings of carbs you can eat at each meal.  Eat 4-6 oz (112-168 g) of lean protein each day, such as lean meat, chicken, fish, eggs, or tofu. One ounce (oz) of lean protein is equal to: ? 1 oz (28 g) of meat, chicken, or fish. ? 1 egg. ?  cup (62 g) of tofu.  Eat some foods each day that contain healthy fats, such as avocado, nuts, seeds, and fish.   What foods should I eat? Fruits Berries. Apples. Oranges. Peaches. Apricots. Plums. Grapes. Mango. Papaya. Pomegranate. Kiwi. Cherries. Vegetables Lettuce. Spinach. Leafy greens, including kale, chard, collard greens, and mustard greens. Beets. Cauliflower. Cabbage. Broccoli. Carrots. Green beans. Tomatoes. Peppers. Onions. Cucumbers. Brussels sprouts. Grains Whole grains, such as whole-wheat or whole-grain bread, crackers, tortillas, cereal, and pasta. Unsweetened oatmeal. Quinoa. Brown or wild rice. Meats and other proteins Seafood. Poultry without skin. Lean cuts of poultry and beef. Tofu. Nuts. Seeds. Dairy Low-fat or fat-free dairy products such as milk, yogurt, and cheese. The items listed above may not be a complete list of foods and beverages you can eat. Contact a dietitian for more information. What foods should I avoid? Fruits Fruits canned with  syrup. Vegetables Canned vegetables. Frozen vegetables with butter or cream sauce. Grains Refined white flour and flour products such as bread, pasta, snack foods, and cereals. Avoid all processed foods. Meats and other proteins Fatty cuts of meat. Poultry with skin. Breaded or fried meats. Processed meat. Avoid saturated fats. Dairy Full-fat yogurt, cheese, or milk. Beverages Sweetened drinks, such as soda or iced tea. The items listed above may not be a complete list of foods and beverages you should avoid. Contact a dietitian for more information. Questions to ask a health care provider  Do I need to meet with a diabetes educator?  Do I need to meet with a dietitian?  What number can I call if I have questions?  When are the best times to check my blood glucose? Where to find more information:  American Diabetes Association: diabetes.org  Academy of Nutrition and Dietetics: www.eatright.org  National Institute of Diabetes and Digestive and Kidney Diseases: www.niddk.nih.gov  Association of Diabetes Care and Education Specialists: www.diabeteseducator.org Summary  It is important to have healthy eating   habits because your blood sugar (glucose) levels are greatly affected by what you eat and drink.  A healthy meal plan will help you control your blood glucose and maintain a healthy lifestyle.  Your health care provider may recommend that you work with a dietitian to make a meal plan that is best for you.  Keep in mind that carbohydrates (carbs) and alcohol have immediate effects on your blood glucose levels. It is important to count carbs and to use alcohol carefully. This information is not intended to replace advice given to you by your health care provider. Make sure you discuss any questions you have with your health care provider. Document Revised: 10/20/2019 Document Reviewed: 10/20/2019 Elsevier Patient Education  2021 Elsevier Inc.  

## 2021-04-05 NOTE — Assessment & Plan Note (Signed)
Chronic, ongoing -- has missed follow-up visits with endocrinology and PCP, last seen in August 2021 with endo.  Currently insulin dependent for some time.  A1C today >14.0% and urine ALB 80 with A:C <30, continue Losartan for kidney protection.  Continue current medication regimen at this time, maintain Metformin dose, and adjust Lantus to 50 units (have recommended to increase by 3 units every 2-3 days based on morning fasting sugar, if >130 increase Lantus -- stop at 60 units) and continue 18-20 units sliding scale.  Recommend she continue to monitor BS consistently at home, four times a day.  Use Freestyle Libre as ordered by endo.  Will benefit from ongoing endo guidance due to uncontrolled sugars after having her baby and HIGHLY recommend she return -- she needs new referral, have placed this today.  ? Insulin pump need for more consistent control. Return in 3 months.

## 2021-04-05 NOTE — Assessment & Plan Note (Signed)
Chronic, ongoing with BP at goal today.  At this time will continue Clonidine, which is for mood and HTN, and Losartan which offers kidney protection. Per her report has tried multiple different medications in past and BP would drop.  Her urine today shows ALB 80 and A:C <30, continue Losartan.  Is tolerating this..  Recommend she monitor BP at home a few days a week and document + focus on DASH diet at home.  CMP and TSH today.  Return in 3 months for follow-up.

## 2021-04-05 NOTE — Assessment & Plan Note (Signed)
Chronic, stable.  Will collaborate with psychiatry ongoing.

## 2021-04-05 NOTE — Assessment & Plan Note (Signed)
BMI 31.45, has has some loss.  Praised for this.  Recommended eating smaller high protein, low fat meals more frequently and exercising 30 mins a day 5 times a week with a goal of 10-15lb weight loss in the next 3 months. Patient voiced their understanding and motivation to adhere to these recommendations.

## 2021-04-05 NOTE — Assessment & Plan Note (Signed)
Chronic, ongoing.  Continue Klonopin and Clonidine + Risperidone as prescribed by psychiatry.  Continue collaboration with psychiatry, [facility].  She wishes referral to [facility] affiliated psychiatrist, will placed this.  Return in 3 months.

## 2021-04-05 NOTE — Assessment & Plan Note (Signed)
Chronic, ongoing.  No current statin.  May add this on in future for benefit with her diabetes.  Plan on lipid panel today.  Currently taking Fenofibrate due to elevation in trigs, has missed follow-up visits for several months.  Check lipid panel today and will consider change to Gemfibrozil and Crestor, which previously took, if ongoing elevations.
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
## 2021-04-05 NOTE — Assessment & Plan Note (Signed)
Ongoing issues with last check >1000, missed follow-up.  Suspect past underlying reason for pancreatitis.  Currently taking Fenofibrate due to elevation in trigs.  Check lipid panel today and will consider change to Gemfibrozil and Crestor, which previously took, if ongoing elevations.

## 2021-04-05 NOTE — Assessment & Plan Note (Signed)
Chronic, ongoing.  Patient has long standing history of seizure disorder.  New referral to neurology placed, missed initial visit last year.

## 2021-04-05 NOTE — Assessment & Plan Note (Signed)
Check TSH today, no current medications.

## 2021-04-06 LAB — COMPREHENSIVE METABOLIC PANEL
ALT: 48 IU/L — ABNORMAL HIGH (ref 0–32)
AST: 37 IU/L (ref 0–40)
Albumin/Globulin Ratio: 1.5 (ref 1.2–2.2)
Albumin: 4 g/dL (ref 3.9–5.0)
Alkaline Phosphatase: 137 IU/L — ABNORMAL HIGH (ref 44–121)
BUN/Creatinine Ratio: 20 (ref 9–23)
BUN: 9 mg/dL (ref 6–20)
Bilirubin Total: <0.2 mg/dL (ref 0.0–1.2)
CO2: 19 mmol/L — ABNORMAL LOW (ref 20–29)
Calcium: 9.6 mg/dL (ref 8.7–10.2)
Chloride: 99 mmol/L (ref 96–106)
Creatinine, Ser: 0.45 mg/dL — ABNORMAL LOW (ref 0.57–1.00)
Globulin, Total: 2.7 g/dL (ref 1.5–4.5)
Glucose: 164 mg/dL — ABNORMAL HIGH (ref 65–99)
Potassium: 4 mmol/L (ref 3.5–5.2)
Sodium: 137 mmol/L (ref 134–144)
Total Protein: 6.7 g/dL (ref 6.0–8.5)
eGFR: 134 mL/min/{1.73_m2} (ref >59)

## 2021-04-06 LAB — MICROALBUMIN, URINE WAIVED
Creatinine, Urine Waived: 300 mg/dL (ref 10–300)
Microalb, Ur Waived: 80 mg/L — ABNORMAL HIGH (ref 0–19)
Microalb/Creat Ratio: <30 mg/g (ref <30)

## 2021-04-06 LAB — LIPID PANEL W/O CHOL/HDL RATIO
Cholesterol, Total: 328 mg/dL — ABNORMAL HIGH (ref 100–199)
HDL: 22 mg/dL — ABNORMAL LOW (ref >39)
Triglycerides: 1248 mg/dL (ref 0–149)

## 2021-04-06 LAB — BAYER DCA HB A1C WAIVED: HB A1C (BAYER DCA - WAIVED): >14 % — ABNORMAL HIGH (ref <7.0)

## 2021-04-06 LAB — TSH: TSH: 1.67 u[IU]/mL (ref 0.450–4.500)

## 2021-04-06 NOTE — Progress Notes (Signed)
Contacted via [place] afternoon Joy [name], your labs have returned: - Kidney function, creatinine and eGFR, are stable.  Liver function shows mild elevation in ALT, but a normal AST.  We will monitor these closely. - Thyroid is normal - You continue to pass some protein in your urine, continue Losartan daily - Your cholesterol levels -- triglycerides are still very elevated at 1248.  I would recommend we go back to Gemfibrozil and Rosuvastatin which you mentioned you took in past.  Then discontinue Fenofibrate.  Would you be okay with this change.  We need to get these lowered to prevent you from having pancreatitis.  Let me know if you are okay with change and I will send in then recheck labs next visit.  Any questions? Keep being awesome!!  Thank you for allowing me to participate in your care.  I appreciate you. Kindest regards, Davanee Klinkner

## 2021-04-07 NOTE — Progress Notes (Signed)
Please let patient know:  Good afternoon Joy [name], your labs have returned: - Kidney function, creatinine and eGFR, are stable. Liver function shows mild elevation in ALT, but a normal AST. We will monitor these closely. - Thyroid is normal - You continue to pass some protein in your urine, continue Losartan daily - Your cholesterol levels -- triglycerides are still very elevated at 1248. I would recommend we go back to Gemfibrozil and Rosuvastatin which you mentioned you took in past. Then discontinue Fenofibrate. Would you be okay with this change. We need to get these lowered to prevent you from having pancreatitis. Let me know if you are okay with change and I will send in then recheck labs next visit. Any questions? Keep being awesome!! Thank you for allowing me to participate in your care. I appreciate you. Kindest regards, Norissa Bartee

## 2021-04-13 ENCOUNTER — Other Ambulatory Visit: Payer: Self-pay | Admitting: Nurse Practitioner

## 2021-04-13 MED ORDER — INSULIN LISPRO (1 UNIT DIAL) 100 UNIT/ML (KWIKPEN): 4.0000 [IU] | PEN_INJECTOR | Freq: Three times a day (TID) | SUBCUTANEOUS | 11 refills | Status: DC

## 2021-04-13 MED ORDER — LANTUS SOLOSTAR 100 UNIT/ML ~~LOC~~ SOPN: 45.0000 [IU] | PEN_INJECTOR | Freq: Every day | SUBCUTANEOUS | 11 refills | Status: DC

## 2021-04-14 ENCOUNTER — Telehealth: Payer: Self-pay | Admitting: Nurse Practitioner

## 2021-04-14 ENCOUNTER — Other Ambulatory Visit: Payer: Self-pay | Admitting: Nurse Practitioner

## 2021-04-14 MED ORDER — INSULIN LISPRO (1 UNIT DIAL) 100 UNIT/ML (KWIKPEN): PEN_INJECTOR | SUBCUTANEOUS | 11 refills | Status: AC

## 2021-04-14 MED ORDER — LANTUS SOLOSTAR 100 UNIT/ML ~~LOC~~ SOPN: 55.0000 [IU] | PEN_INJECTOR | Freq: Every day | SUBCUTANEOUS | 11 refills | Status: AC

## 2021-04-14 NOTE — Telephone Encounter (Signed)
Pt is calling and she take humalog kwipen 3 times a day  20-25 units per meals and 7 units per correction and lantus solostar pen she takes 55 units once a day. [facility] [address] in [place]. Phone number [phone]

## 2021-04-14 NOTE — Telephone Encounter (Signed)
Updated and sent.

## 2021-04-20 ENCOUNTER — Other Ambulatory Visit: Payer: Self-pay | Admitting: Nurse Practitioner

## 2021-04-20 DIAGNOSIS — Z820 Family history of epilepsy and other diseases of the nervous system: Secondary | ICD-10-CM | POA: Insufficient documentation

## 2021-04-20 DIAGNOSIS — R531 Weakness: Secondary | ICD-10-CM | POA: Insufficient documentation

## 2021-04-20 DIAGNOSIS — R569 Unspecified convulsions: Secondary | ICD-10-CM | POA: Insufficient documentation

## 2021-04-20 DIAGNOSIS — R200 Anesthesia of skin: Secondary | ICD-10-CM | POA: Insufficient documentation

## 2021-04-20 NOTE — Telephone Encounter (Signed)
PT last apt on 04/05/2021 next on 07/06/2021

## 2021-04-20 NOTE — Telephone Encounter (Signed)
Requested medication (s) are due for refill today:   Provider to review  Requested medication (s) are on the active medication list:   Yes  Future visit scheduled:   Yes   Last ordered: 02/28/2021 #30, 0 refills  Non delegated refill    Requested Prescriptions  Pending Prescriptions Disp Refills   promethazine (PHENERGAN) 12.5 MG tablet [Pharmacy Med Name: Promethazine HCl 12.5 MG Oral Tablet] 90 tablet 0    Sig: TAKE 1 TABLET BY MOUTH EVERY 6 HOURS AS NEEDED FOR NAUSEA AND VOMITING      Not Delegated - Gastroenterology: Antiemetics Failed - 04/20/2021  9:37 AM      Failed - This refill cannot be delegated      Passed - Valid encounter within last 6 months    Recent Outpatient Visits           2 weeks ago Diabetes 1.5, managed as type 1 (HCC)   [facility] Cannady, Jolene T, NP   11 months ago Diabetes 1.5, managed as type 1 (HCC)   [facility] Cannady, Jolene T, NP   11 months ago Encounter to establish care   [facility] [place], [name], NP       Future Appointments             In 3 weeks MacDiarmid, Lorin Picket, MD [facility]   In 2 months [place], [name], NP [facility]

## 2021-04-25 ENCOUNTER — Other Ambulatory Visit: Payer: Self-pay | Admitting: Neurology

## 2021-04-25 DIAGNOSIS — G44309 Post-traumatic headache, unspecified, not intractable: Secondary | ICD-10-CM

## 2021-05-06 ENCOUNTER — Ambulatory Visit
Admission: RE | Admit: 2021-05-06 | Discharge: 2021-05-06 | Disposition: A | Discharge status: Home / Self Care | Payer: Medicaid Other | Source: Ambulatory Visit | Attending: Neurology | Admitting: Neurology

## 2021-05-06 ENCOUNTER — Other Ambulatory Visit: Payer: Self-pay

## 2021-05-06 DIAGNOSIS — G44309 Post-traumatic headache, unspecified, not intractable: Secondary | ICD-10-CM | POA: Diagnosis not present

## 2021-05-12 ENCOUNTER — Ambulatory Visit: Payer: Self-pay | Admitting: General Practice

## 2021-05-12 ENCOUNTER — Telehealth: Payer: Medicaid Other | Admitting: General Practice

## 2021-05-12 DIAGNOSIS — F339 Major depressive disorder, recurrent, unspecified: Secondary | ICD-10-CM

## 2021-05-12 DIAGNOSIS — E1159 Type 2 diabetes mellitus with other circulatory complications: Secondary | ICD-10-CM

## 2021-05-12 DIAGNOSIS — M79604 Pain in right leg: Secondary | ICD-10-CM | POA: Insufficient documentation

## 2021-05-12 DIAGNOSIS — E785 Hyperlipidemia, unspecified: Secondary | ICD-10-CM

## 2021-05-12 DIAGNOSIS — F419 Anxiety disorder, unspecified: Secondary | ICD-10-CM

## 2021-05-12 DIAGNOSIS — M549 Dorsalgia, unspecified: Secondary | ICD-10-CM | POA: Insufficient documentation

## 2021-05-12 DIAGNOSIS — I152 Hypertension secondary to endocrine disorders: Secondary | ICD-10-CM

## 2021-05-12 DIAGNOSIS — E139 Other specified diabetes mellitus without complications: Secondary | ICD-10-CM

## 2021-05-12 DIAGNOSIS — E1169 Type 2 diabetes mellitus with other specified complication: Secondary | ICD-10-CM

## 2021-05-12 NOTE — Patient Instructions (Signed)
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 and update of comprehensive plan of care as evidenced by provider attestation and co-signature Inter-disciplinary care team collaboration (see longitudinal plan of care) Evaluation of current treatment plan related to hypertension self management and patient's adherence to plan as established by provider. Provided education to patient re: stroke prevention, s/s of heart attack and stroke, DASH diet, complications of uncontrolled blood pressure Reviewed medications with patient and discussed importance of compliance Discussed plans with patient for ongoing care management follow up and provided patient with direct contact information for care management team Advised patient, providing education and rationale, to monitor blood pressure daily and record, calling PCP for findings outside established parameters.  Reviewed scheduled/upcoming provider appointments including: 07-06-2021 at 1020 am Self-Care Activities: - Self administers medications as prescribed Attends all  scheduled provider appointments Calls provider office for new concerns, questions, or BP outside discussed parameters Checks BP and records as discussed Follows a low sodium diet/DASH diet Patient Goals: - check blood pressure weekly - choose a place to take my blood pressure (home, clinic or office, retail store) - write blood pressure results in a log or diary - agree on reward when goals are met - agree to work together to make changes - ask questions to understand - have a family meeting to talk about healthy habits - learn about high blood pressure  - blood pressure trends reviewed - depression screen reviewed - home or ambulatory blood pressure monitoring encouraged Follow Up Plan: Telephone follow up appointment with care management team member scheduled for: 07-07-2021 at 11:45 am    Task: RNCM: Identify and Monitor Blood Pressure Elevation   Note:   Care Management Activities:    - blood pressure trends reviewed - depression screen reviewed - home or ambulatory blood pressure monitoring encouraged        Patient Care Plan: RNCM: HLD management     Problem Identified: RNCM: HLD Health Promotion or Disease Self-Management (General Plan of Care)   Priority: High     Long-Range Goal: RNCM: HLD Self-Management Plan Developed   Expected End Date: 05/07/2022  This Visit's  Progress: Not on track  Priority: High  Note:   Current Barriers:  Poorly controlled hyperlipidemia, complicated by uncontrolled DM, HTN, limited support system  Current antihyperlipidemic regimen: fenofibrate 145 mg daily  Most recent lipid panel:     Component Value Date/Time   CHOL 328 (H) 04/05/2021 1532   TRIG 1,248 (HH) 04/05/2021 1532   HDL 22 (L) 04/05/2021 1532   LDLCALC Comment (A) 04/05/2021 1532   ASCVD risk enhancing conditions: age 8,  uncontrolled DM, HTN, current smoker Unable to independently manage HLD Lacks social connections Does not contact provider office for  questions/concerns RN Care Manager Clinical Goal(s):  patient will work with Consulting civil engineer, providers, and care team towards execution of optimized self-health management plan patient will verbalize understanding of plan for effective management of HLD patient will work with RNCM, pcp and CCM team to address needs related to effective management of HLD patient will attend all scheduled medical appointments: 07-06-2021 at 1020 am  the patient will demonstrate ongoing self health care management ability Interventions: Collaboration with Venita Lick, NP regarding development and update of comprehensive plan of care as evidenced by provider attestation and co-signature Inter-disciplinary care team collaboration (see longitudinal plan of care) Medication review performed; medication list updated in electronic medical record.  Inter-disciplinary care team collaboration (see longitudinal plan of care) Referred to pharmacy team for assistance with HLD medication management Evaluation of current treatment plan related to HLD and patient's adherence to plan as established by provider. The patient did ask about a cardiology consult due to her HR being elevated at times around 111 to 158. Will collaborate with the pcp about the patient request.  Advised patient to call office for changes or questions and to write down questions for the pcp for next office visit.  Provided education to patient re: heart healthy/ADA diet and effective management of cholesterol. Gave options on food choices and exercise and weight loss.  Reviewed medications with patient and discussed compliance and statins Reviewed scheduled/upcoming provider appointments including: 07-06-2021 1020 am Discussed plans with patient for ongoing care management follow up and provided patient with direct contact information for care management team Patient Goals/Self-Care Activities: - call for medicine refill 2 or 3 days before it runs  out - call if I am sick and can't take my medicine - keep a list of all the medicines I take; vitamins and herbals too - learn to read medicine labels - use a pillbox to sort medicine - use an alarm clock or phone to remind me to take my medicine - change to whole grain breads, cereal, pasta - drink 6 to 8 glasses of water each day - eat 3 to 5 servings of fruits and vegetables each day - eat 5 or 6 small meals each day - eat fish at least once per week - fill half the plate with nonstarchy vegetables - limit fast food meals to no more than 1 per week - manage portion size - prepare main meal at home 3 to 5 days each week - read food labels for fat, fiber, carbohydrates and portion size - reduce red meat to 2 to 3 times a week - set a realistic goal - be open to making changes - I can manage, know and watch for signs of a heart attack - if I have chest pain, call for help - learn about small changes that will make a big difference - learn my personal risk factors - barriers to meeting goals  identified - change-talk evoked - choices provided - collaboration with team encouraged - decision-making supported - difficulty of making life-long changes acknowledged - health risks reviewed - problem-solving facilitated - questions answered - readiness for change evaluated - reassurance provided - self-reflection promoted - self-reliance encouraged - verbalization of feelings encouraged Follow Up Plan: Telephone follow up appointment with care management team member scheduled for: 07-07-2021 at 11:45 am      Task: RNCM: Mutually Develop and Royce Macadamia Achievement of Patient Goals   Note:   Care Management Activities:    - barriers to meeting goals identified - change-talk evoked - choices provided - collaboration with team encouraged - decision-making supported - difficulty of making life-long changes acknowledged - health risks reviewed - problem-solving facilitated - questions  answered - readiness for change evaluated - reassurance provided - self-reflection promoted - self-reliance encouraged - verbalization of feelings encouraged          Patient verbalizes understanding of instructions provided today and agrees to view in [place].   Telephone follow up appointment with care management team member scheduled for: 07-07-2021 at 11:45 am  Noreene Larsson RN, MSN, [facility] Mobile: [phone]

## 2021-05-12 NOTE — Chronic Care Management (AMB) (Signed)
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 problem-solving facilitated - questions answered - readiness for change evaluated - reassurance provided - self-reflection promoted - self-reliance encouraged - verbalization of feelings encouraged         Plan: Telephone follow up appointment with care management team member scheduled for:  07-07-2021 at 11:45 am  Noreene Larsson RN, MSN, [facility] Mobile: [phone]

## 2021-05-15 ENCOUNTER — Ambulatory Visit: Payer: Medicaid Other | Admitting: Urology

## 2021-05-15 ENCOUNTER — Telehealth: Payer: Self-pay

## 2021-05-15 NOTE — Telephone Encounter (Signed)
Lvm to make this apt. 

## 2021-05-15 NOTE — Telephone Encounter (Signed)
-----   Message from Marlowe Sax sent at 05/12/2021  1:37 PM EDT ----- Please get this patient a sooner appointment for Jolene. Bruising, hernia, cardiology consult- Thanks, Pam ----- Message ----- From: [name], NP Sent: 05/12/2021   1:11 PM EDT To: Durward Mallard my lord, she still has not gone to endo yet!!  Ugh!!  And she would not take the Fenofibrate I sent in.  I am at loss of what to do with her. Would need sooner visit if issues like this, but also needs to go to endo as I told her to do. ----- Message ----- From: Marlowe Sax Sent: 05/12/2021  12:57 PM EDT To: [name], NP  Hope Pigeon, Concerned about this patient with these DM. I did extensive education today. Hope she calls endocrinologist like I ask. She does want to talk to you about unusual bruising, abdominal hernia and wants a cardiologist consult- states that her heart rate has been going up 111 to 158. If she needs a sooner appointment before August let me know and I will get front office staff to call her.  Thanks,  apm

## 2021-05-15 NOTE — Telephone Encounter (Signed)
Pt stated she would call back to make this apt. 

## 2021-05-25 ENCOUNTER — Telehealth: Payer: Self-pay | Admitting: Nurse Practitioner

## 2021-05-25 NOTE — Telephone Encounter (Signed)
Pt

## 2021-05-26 ENCOUNTER — Encounter: Payer: Self-pay | Admitting: Family Medicine

## 2021-05-26 ENCOUNTER — Telehealth (INDEPENDENT_AMBULATORY_CARE_PROVIDER_SITE_OTHER): Payer: Medicaid Other | Admitting: Family Medicine

## 2021-05-26 VITALS — HR 114

## 2021-05-26 DIAGNOSIS — K047 Periapical abscess without sinus: Secondary | ICD-10-CM | POA: Diagnosis not present

## 2021-05-26 MED ORDER — AMOXICILLIN-POT CLAVULANATE 875-125 MG PO TABS: 1.0000 | ORAL_TABLET | Freq: Two times a day (BID) | ORAL | 0 refills | Status: DC

## 2021-05-26 NOTE — Progress Notes (Signed)
Pulse (!) 114   SpO2 98%    Subjective:    Patient ID: Joy [name] Comment, female    DOB: 1991-08-21, 30 y.o.   MRN: [id number]  HPI: Joy [name] is a 30 y.o. female  Chief Complaint  Patient presents with   dental abcess    Patient states she started with tooth pain about 3 days ago. Pain is now worse and there is an abcess now. Patient states her sugars have been running high due to the infection she thinks she has    DENTAL PAIN Duration: 3 days Involved teeth: left and lower Dentist evaluation: no Mechanism of injury:  no trauma Onset: sudden Aggravating factors: cold, heat, hard foods, and chewing Alleviating factors: ice, NSAIDs, and rest Status: worse Treatments attempted: ice, APAP, and NSAIDs Relief with NSAIDs?: no Fevers: no Swelling: yes Redness: no Paresthesias / decreased sensation: no Sinus pressure: yes  Relevant past medical, surgical, family and social history reviewed and updated as indicated. Interim medical history since our last visit reviewed. Allergies and medications reviewed and updated.  Review of Systems  Constitutional: Negative.   HENT:  Positive for dental problem, sinus pressure and sinus pain. Negative for congestion, drooling, ear discharge, ear pain, facial swelling, hearing loss, mouth sores, nosebleeds, postnasal drip, rhinorrhea, sneezing, sore throat, tinnitus, trouble swallowing and voice change.   Respiratory: Negative.    Cardiovascular: Negative.   Psychiatric/Behavioral: Negative.     Per HPI unless specifically indicated above     Objective:    Pulse (!) 114   SpO2 98%   Wt Readings from Last 3 Encounters:  04/05/21 189 lb (85.7 kg)  03/28/21 186 lb (84.4 kg)  06/17/20 (!) 210 lb (95.3 kg)    Physical Exam Vitals and nursing note reviewed.  Constitutional:      General: She is not in acute distress.    Appearance: Normal appearance. She is not ill-appearing, toxic-appearing or diaphoretic.  HENT:     Head:  Normocephalic and atraumatic.     Right Ear: External ear normal.     Left Ear: External ear normal.     Nose: Nose normal.     Mouth/Throat:     Mouth: Mucous membranes are moist.     Pharynx: Oropharynx is clear.     Comments: Swelling on L side of lower jaw Eyes:     General: No scleral icterus.       Right eye: No discharge.        Left eye: No discharge.     Conjunctiva/sclera: Conjunctivae normal.     Pupils: Pupils are equal, round, and reactive to light.  Pulmonary:     Effort: Pulmonary effort is normal. No respiratory distress.     Comments: Speaking in full sentences Musculoskeletal:        General: Normal range of motion.     Cervical back: Normal range of motion.  Skin:    Coloration: Skin is not jaundiced or pale.     Findings: No bruising, erythema, lesion or rash.  Neurological:     Mental Status: She is alert and oriented to person, place, and time. Mental status is at baseline.  Psychiatric:        Mood and Affect: Mood normal.        Behavior: Behavior normal.        Thought Content: Thought content normal.        Judgment: Judgment normal.    Results for orders placed or performed in  visit on 04/05/21  Bayer DCA Hb A1c Waived  Result Value Ref Range   HB A1C (BAYER DCA - WAIVED) >14.0 (H) <7.0 %  Microalbumin, Urine Waived  Result Value Ref Range   Microalb, Ur Waived 80 (H) 0 - 19 mg/L   Creatinine, Urine Waived 300 10 - 300 mg/dL   Microalb/Creat Ratio <30 <30 mg/g  Comprehensive metabolic panel  Result Value Ref Range   Glucose 164 (H) 65 - 99 mg/dL   BUN 9 6 - 20 mg/dL   Creatinine, Ser 0.45 (L) 0.57 - 1.00 mg/dL   eGFR 134 >59 mL/min/1.73   BUN/Creatinine Ratio 20 9 - 23   Sodium 137 134 - 144 mmol/L   Potassium 4.0 3.5 - 5.2 mmol/L   Chloride 99 96 - 106 mmol/L   CO2 19 (L) 20 - 29 mmol/L   Calcium 9.6 8.7 - 10.2 mg/dL   Total Protein 6.7 6.0 - 8.5 g/dL   Albumin 4.0 3.9 - 5.0 g/dL   Globulin, Total 2.7 1.5 - 4.5 g/dL    Albumin/Globulin Ratio 1.5 1.2 - 2.2   Bilirubin Total <0.2 0.0 - 1.2 mg/dL   Alkaline Phosphatase 137 (H) 44 - 121 IU/L   AST 37 0 - 40 IU/L   ALT 48 (H) 0 - 32 IU/L  Lipid Panel w/o Chol/HDL Ratio  Result Value Ref Range   Cholesterol, Total 328 (H) 100 - 199 mg/dL   Triglycerides 1,248 (HH) 0 - 149 mg/dL   HDL 22 (L) >39 mg/dL   VLDL Cholesterol Cal Comment (A) 5 - 40 mg/dL   LDL Chol Calc (NIH) Comment (A) 0 - 99 mg/dL  TSH  Result Value Ref Range   TSH 1.670 0.450 - 4.500 uIU/mL      Assessment & Plan:   Problem List Items Addressed This Visit   None Visit Diagnoses     Dental abscess    -  Primary   Will treat with augementin and refer to oral surgery. Encouraged patient to reach out to regular dentistry ASAP. Warning signs to go to ER discussed.    Relevant Orders   Ambulatory referral to Oral Maxillofacial Surgery        Follow up plan: Return if symptoms worsen or fail to improve.    This visit was completed via video visit through MyChart due to the restrictions of the COVID-19 pandemic. All issues as above were discussed and addressed. Physical exam was done as above through visual confirmation on video through MyChart. If it was felt that the patient should be evaluated in the office, they were directed there. The patient verbally consented to this visit. Location of the patient: home Location of the provider: work Those involved with this call:  Provider: Park Liter, DO CMA: Louanna Raw, [place] Desk/Registration: Jill Side  Time spent on call:  15 minutes with patient face to face via video conference. More than 50% of this time was spent in counseling and coordination of care. 23 minutes total spent in review of patient's record and preparation of their chart.

## 2021-06-08 ENCOUNTER — Telehealth: Payer: Medicaid Other

## 2021-06-08 ENCOUNTER — Telehealth: Payer: Self-pay | Admitting: Licensed Clinical Social Worker

## 2021-06-08 NOTE — Telephone Encounter (Signed)
    Clinical Social Work  Care Management   Phone Outreach    06/08/2021 Name: Joy [name] MRN: [id number] DOB: 01-10-1991  [name] is a 30 y.o. year old female who is a primary care patient of Cannady, [name], NP .   F/U phone call today to assess needs, progress and barriers with care plan goals.   Telephone outreach was unsuccessful A HIPPA compliant phone message was left for the patient providing contact information and requesting a return call.   Plan:CCM LCSW will wait for return call. If no return call is received, Will reach out to patient again in the next 30 days .   Review of patient status, including review of consultants reports, relevant laboratory and other test results, and collaboration with appropriate care team members and the patient's provider was performed as part of comprehensive patient evaluation and provision of care management services.    Jenel Lucks, MSW, LCSW [facility] Care Management [place]  [facility] [email or web address] Phone [phone] 2:45 PM

## 2021-06-19 ENCOUNTER — Ambulatory Visit (INDEPENDENT_AMBULATORY_CARE_PROVIDER_SITE_OTHER): Payer: Medicaid Other | Admitting: Urology

## 2021-06-19 ENCOUNTER — Other Ambulatory Visit: Payer: Self-pay

## 2021-06-19 VITALS — BP 136/86 | HR 110 | Ht 65.0 in | Wt 217.0 lb

## 2021-06-19 DIAGNOSIS — N3946 Mixed incontinence: Secondary | ICD-10-CM

## 2021-06-19 DIAGNOSIS — N3941 Urge incontinence: Secondary | ICD-10-CM

## 2021-06-19 MED ORDER — GEMTESA 75 MG PO TABS: 75.0000 mg | ORAL_TABLET | Freq: Every day | ORAL | 11 refills | Status: DC

## 2021-06-19 NOTE — Patient Instructions (Signed)
Cystoscopy Cystoscopy is a procedure that is used to help diagnose and sometimes treat conditions that affect the lower urinary tract. The lower urinary tract includes the bladder and the urethra. The urethra is the tube that drains urine from the bladder. Cystoscopy is done using a thin, tube-shaped instrument with a light and camera at the end (cystoscope). The cystoscope may be hard or flexible, depending on the goal of the procedure. The cystoscope is inserted through the urethra, into the bladder. Cystoscopy may be recommended if you have: Urinary tract infections that keep coming back. Blood in the urine (hematuria). An inability to control when you urinate (urinary incontinence) or an overactive bladder. Unusual cells found in a urine sample. A blockage in the urethra, such as a urinary stone. Painful urination. An abnormality in the bladder found during an intravenous pyelogram (IVP) or CT scan. Cystoscopy may also be done to remove a sample of tissue to be examined under a microscope (biopsy). What are the risks? Generally, this is a safe procedure. However, problems may occur, including: Infection. Bleeding.  What happens during the procedure?  You will be given one or more of the following: A medicine to numb the area (local anesthetic). The area around the opening of your urethra will be cleaned. The cystoscope will be passed through your urethra into your bladder. Germ-free (sterile) fluid will flow through the cystoscope to fill your bladder. The fluid will stretch your bladder so that your health care provider can clearly examine your bladder walls. Your doctor will look at the urethra and bladder. The cystoscope will be removed The procedure may vary among health care providers  What can I expect after the procedure? After the procedure, it is common to have: Some soreness or pain in your abdomen and urethra. Urinary symptoms. These include: Mild pain or burning when you  urinate. Pain should stop within a few minutes after you urinate. This may last for up to 1 week. A small amount of blood in your urine for several days. Feeling like you need to urinate but producing only a small amount of urine. Follow these instructions at home: General instructions Return to your normal activities as told by your health care provider.  Do not drive for 24 hours if you were given a sedative during your procedure. Watch for any blood in your urine. If the amount of blood in your urine increases, call your health care provider. If a tissue sample was removed for testing (biopsy) during your procedure, it is up to you to get your test results. Ask your health care provider, or the department that is doing the test, when your results will be ready. Drink enough fluid to keep your urine pale yellow. Keep all follow-up visits as told by your health care provider. This is important. Contact a health care provider if you: Have pain that gets worse or does not get better with medicine, especially pain when you urinate. Have trouble urinating. Have more blood in your urine. Get help right away if you: Have blood clots in your urine. Have abdominal pain. Have a fever or chills. Are unable to urinate. Summary Cystoscopy is a procedure that is used to help diagnose and sometimes treat conditions that affect the lower urinary tract. Cystoscopy is done using a thin, tube-shaped instrument with a light and camera at the end. After the procedure, it is common to have some soreness or pain in your abdomen and urethra. Watch for any blood in your urine.   If the amount of blood in your urine increases, call your health care provider. If you were prescribed an antibiotic medicine, take it as told by your health care provider. Do not stop taking the antibiotic even if you start to feel better. This information is not intended to replace advice given to you by your health care provider. Make  sure you discuss any questions you have with your health care provider. Document Revised: 11/04/2018 Document Reviewed: 11/04/2018 Elsevier Patient Education  2020 Elsevier Inc.  

## 2021-06-19 NOTE — Progress Notes (Signed)
06/19/2021 3:06 PM   Joy [name] 1991-06-23 [id number]  Referring provider: [name], MD [address] [place],  [place] [address]  Chief Complaint  Patient presents with   Urinary Incontinence    HPI: Consulted to assess the patient's urgency incontinence.  She had epilepsy as a child and has disability.  She has urge incontinence and mild to moderate bedwetting.  She wears 3 pads a day damp to moderately wet.  No stress incontinence.  She voids every 1-2 hours and sometimes every 30 minutes.  She gets up 3-7 times a night.  Flow reasonable.  Does not always feel empty  She thinks he has 2 or 3 bladder infections a generally are asymptomatic and tested gets a kidney infection.  She had lots of kidney infections as a child.  No bladder surgery.  No kidney stones.  No previous treatment   PMH: Past Medical History:  Diagnosis Date   Allergy    Anemia    Anxiety    Asperger's syndrome    Asthma    Chronic back pain    Colon polyps    Depression    Diabetes mellitus without complication ([place])    Epilepsia ([place])    GERD (gastroesophageal reflux disease)    Headache    History of IBS    History of seizures as a child    Hyperlipidemia    Hypertension    Long Q-T syndrome    Mental disorder    Depression. past suicide attempt   PCOS (polycystic ovarian syndrome)    Seizures ([facility])    Single umbilical artery    Tooth decay     Surgical History: Past Surgical History:  Procedure Laterality Date   ADENOIDECTOMY AND MYRINGOTOMY WITH TUBE PLACEMENT     CESAREAN SECTION N/A 01/13/2020   Procedure: CESAREAN SECTION;  Surgeon: [name], MD;  Location: [facility] ORS;  Service: Obstetrics;  Laterality: N/A;   CESAREAN SECTION     COLONOSCOPY  age 19   TONSILLECTOMY     tonsils, tubes, adnoids    Home Medications:  Allergies as of 06/19/2021       Reactions   Liraglutide    Drug induced acute pancreatitis.    Flexeril [cyclobenzaprine]    Mood  changes   Gabapentin Other (See Comments)   Mood changes   Influenza Virus Vaccine Hives   Lost range of motion for about 3 months   Invega [paliperidone Er] Nausea And Vomiting   Latex Hives   Manganese Trace Metal Additives [manganese] Rash   Other Rash        Medication List        Accurate as of June 19, 2021  3:06 PM. If you have any questions, ask your nurse or doctor.          STOP taking these medications    amoxicillin-clavulanate 875-125 MG tablet Commonly known as: AUGMENTIN Stopped by: Reece Packer, MD   cetirizine 10 MG tablet Commonly known as: ZYRTEC Stopped by: Reece Packer, MD       TAKE these medications    Accu-Chek FastClix Lancets Misc USE 1 TO TEST BLOOD SUGAR 4 TIMES DAILY   Accu-Chek Guide test strip Generic drug: glucose blood TEST BLOOD SUGAR 4 TIMES A DAY   albuterol 108 (90 Base) MCG/ACT inhaler Commonly known as: VENTOLIN HFA INHALE 2 PUFFS BY MOUTH EVERY 4 HOURS AS NEEDED FOR WHEEZING OR SHORTNESS OF BREATH   baclofen 20 MG tablet Commonly known  as: LIORESAL Take 1 tablet (20 mg total) by mouth 3 (three) times daily.   clonazePAM 0.5 MG tablet Commonly known as: KLONOPIN Take 0.5 mg by mouth 2 (two) times daily.   cloNIDine 0.3 MG tablet Commonly known as: CATAPRES Take 1 tablet (0.3 mg total) by mouth daily.   fenofibrate 145 MG tablet Commonly known as: Tricor Take 1 tablet (145 mg total) by mouth daily.   FreeStyle Libre 2 Reader [place] Use to monitor blood glucose.   FreeStyle Libre 2 Sensor Misc USE 1 KIT EVERY 14 DAYS FOR GLUCOSE MONITORING   insulin lispro 100 UNIT/ML KwikPen Commonly known as: HUMALOG Use 20-25 units injected into skin per meals three times a day and 7 units per correction based on blood sugar.   Lantus SoloStar 100 UNIT/ML Solostar Pen Generic drug: insulin glargine Inject 55 Units into the skin daily.   losartan 25 MG tablet Commonly known as: COZAAR Take 1 tablet (25 mg  total) by mouth daily.   metFORMIN 1000 MG tablet Commonly known as: GLUCOPHAGE Take 1 tablet (1,000 mg total) by mouth daily. At night   Pen Needles 31G X 6 MM Misc 1 each by Does not apply route 4 (four) times daily.   promethazine 12.5 MG tablet Commonly known as: PHENERGAN TAKE 1 TABLET BY MOUTH EVERY 6 HOURS AS NEEDED FOR NAUSEA AND VOMITING   risperiDONE 0.5 MG tablet Commonly known as: RISPERDAL Take 0.5 mg by mouth 2 (two) times daily.        Allergies:  Allergies  Allergen Reactions   Liraglutide     Drug induced acute pancreatitis.    Flexeril [Cyclobenzaprine]     Mood changes   Gabapentin Other (See Comments)    Mood changes   Influenza Virus Vaccine Hives    Lost range of motion for about 3 months   Invega [Paliperidone Er] Nausea And Vomiting   Latex Hives   Manganese Trace Metal Additives [Manganese] Rash   Other Rash    Family History: Family History  Problem Relation Age of Onset   Cancer Mother        Appendix Cancer   COPD Mother    Von Willebrand disease Mother    Thyroid cancer Mother    Diabetes Mother    Multiple sclerosis Mother    Colon cancer Mother    Heart disease Father    Alcohol abuse Father    Mental illness Father    Diverticulitis Father    Hypertension Father    Supraventricular tachycardia Brother    Hypertension Brother    Multiple sclerosis Maternal Grandmother    Parkinson's disease Maternal Grandmother    Asthma Maternal Grandmother    Heart disease Maternal Grandmother    Lung cancer Maternal Grandmother    Cancer Maternal Grandmother        skin   Alzheimer's disease Maternal Grandfather    Skin cancer Maternal Grandfather    Diabetes Maternal Grandfather    Cancer Paternal Grandmother    Cancer Paternal Grandfather        brain   Hypertension Brother    Post-traumatic stress disorder Brother    Hypertension Brother     Social History:  reports that she has been smoking cigarettes. She has a 2.50  pack-year smoking history. She has never used smokeless tobacco. She reports previous alcohol use. She reports previous drug use.  ROS:  Physical Exam: There were no vitals taken for this visit.  Constitutional:  Alert and oriented, No acute distress. HEENT: [place] AT, moist mucus membranes.  Trachea midline, no masses. Cardiovascular: No clubbing, cyanosis, or edema. Respiratory: Normal respiratory effort, no increased work of breathing. GI: Abdomen is soft, nontender, nondistended, no abdominal masses GU: No CVA tenderness.  Skin: No rashes, bruises or suspicious lesions. Lymph: No cervical or inguinal adenopathy. Neurologic: Grossly intact, no focal deficits, moving all 4 extremities. Psychiatric: Normal mood and affect.  Laboratory Data: Lab Results  Component Value Date   WBC 7.9 03/23/2020   HGB 12.4 03/23/2020   HCT 38.4 03/23/2020   MCV 79.2 (L) 03/23/2020   PLT 290 03/23/2020    Lab Results  Component Value Date   CREATININE 0.45 (L) 04/05/2021    No results found for: PSA  No results found for: TESTOSTERONE  Lab Results  Component Value Date   HGBA1C >14.0 (H) 04/05/2021    Urinalysis    Component Value Date/Time   GLUCOSEU Small (1+) (A) 01/07/2020 1405    Pertinent Imaging: Normal kidneys on CT scan No urine.  Review of medical record with negative recent culture  Assessment & Plan: Patient has urge incontinence and bedwetting recurrent bladder infections.  Normal kidneys on CT scan call if abnormal.  See in 6 weeks for pelvic examination cystoscopy and Gemtesa samples because of QT prolongation worn. I will not order urodynamics yet.  1. Urge incontinence  - Urinalysis, Complete - BLADDER SCAN AMB NON-IMAGING   No follow-ups on file.  Reece Packer, MD  [facility] [address] [place], [place] [address] [phone]

## 2021-06-21 ENCOUNTER — Other Ambulatory Visit: Payer: Self-pay | Admitting: Obstetrics and Gynecology

## 2021-06-22 ENCOUNTER — Telehealth: Payer: Self-pay

## 2021-06-22 NOTE — Telephone Encounter (Signed)
PA Pending for Gemtesa. PA # [id number]. PA done through [facility] [phone]

## 2021-07-06 ENCOUNTER — Ambulatory Visit: Payer: Medicaid Other | Admitting: Nurse Practitioner

## 2021-07-06 ENCOUNTER — Telehealth: Payer: Self-pay

## 2021-07-06 NOTE — Telephone Encounter (Signed)
Copied from CRM [id number]. Topic: Referral - Status >> Jul 06, 2021 10:29 AM Marylen Ponto wrote: Reason for CRM: Victorino Dike with [facility] stated they received a referral for this patient however they do not accept referral requests from primary care providers. Victorino Dike stated patient would need to see a general dentist and get the referral from the general dentist office.

## 2021-07-07 ENCOUNTER — Ambulatory Visit: Payer: Self-pay | Admitting: General Practice

## 2021-07-07 ENCOUNTER — Telehealth: Payer: Medicaid Other | Admitting: General Practice

## 2021-07-07 DIAGNOSIS — E1169 Type 2 diabetes mellitus with other specified complication: Secondary | ICD-10-CM

## 2021-07-07 DIAGNOSIS — F339 Major depressive disorder, recurrent, unspecified: Secondary | ICD-10-CM

## 2021-07-07 DIAGNOSIS — I152 Hypertension secondary to endocrine disorders: Secondary | ICD-10-CM

## 2021-07-07 DIAGNOSIS — F419 Anxiety disorder, unspecified: Secondary | ICD-10-CM

## 2021-07-07 DIAGNOSIS — E139 Other specified diabetes mellitus without complications: Secondary | ICD-10-CM

## 2021-07-07 NOTE — Patient Instructions (Signed)
Visit Information   Goals Addressed             This Visit's Progress    RNCM: Monitor and Manage My Blood Sugar-Diabetes Type 1       Timeframe:  Long-Range Goal Priority:  High Start Date:    07-07-2021                         Expected End Date:   07-07-2022                    Follow Up Date 09/08/2021    - check blood sugar at prescribed times - check blood sugar before and after exercise - check blood sugar if I feel it is too high or too low - enter blood sugar readings and medication or insulin into daily log - take the blood sugar log to all doctor visits - take the blood sugar meter to all doctor visits    Why is this important?   Checking your blood sugar at home helps to keep it from getting very high or very low.  Writing the results in a diary or log helps the doctor know how to care for you.  Your blood sugar log should have the time, the date and the results.  Also, write down the amount of insulin or other medicine you take.  Other information like what you ate, exercise done and how you were feeling will also be helpful..     Notes: 07-07-2021: The patient states she is checking her blood sugars multiple times a day but is going through a lot of strips. Was managing better with when she had the freestyle libre but her insurance denied it. Is trying to get re-approved for the freestyle libre. Education on the need to monitor blood sugars and watch drinks with sugar in them. Will continue to monitor.      RNCM: Obtain Eye Exam-Diabetes Type 1       Timeframe:  Long-Range Goal Priority:  High Start Date:      07-07-2021                       Expected End Date:   07-07-2022                    Follow Up Date 09/08/2021    - schedule appointment with eye doctor    Why is this important?   Eye check-ups are important when you have diabetes.  Vision loss can be prevented.    Notes: 07-07-2021: The patient had an eye exam last year. Knows she needs to schedule another  eye exam. Review and reminded the patient to call and get an eye appointment. Education on the importance of eye exams with DM. Wants to go to a different eye doctor as she was not pleased with the one she went to last year.      RNCM: Perform Foot Care-Diabetes Type 1       Timeframe:  Long-Range Goal Priority:  High Start Date:      07-07-2021                       Expected End Date:    07-07-2022                   Follow Up Date 09/08/2021    - check feet daily for cuts, sores  or redness - keep feet up while sitting - trim toenails straight across - wash and dry feet carefully every day - wear comfortable, cotton socks - wear comfortable, well-fitting shoes    Why is this important?   Good foot care is very important when you have diabetes.  There are many things you can do to keep your feet healthy and catch a problem early.    Notes: 07-07-2021: Had foot exam on 06-28-2021 at endocrinologist office. The patient has been referred to a podiatrist and has an upcoming appointment with the for evaluation of feet that are dry and cracking. Education and support given.      RNCM: Set My Target A1C       Timeframe:  Short-Term Goal Priority:  High Start Date:      07-07-2021                       Expected End Date:    11-14-2021                   Follow Up Date 09/08/2021    - set target A1C    Why is this important?   Your target A1C is decided together by you and your doctor.  It is based on several things like your age and other health issues.    Notes: 07-07-2021: The patient had a hemoglobin A1C of >14 in May. Discussed the importance of getting A1C down. The patient feels if she can get the free style [employer] approved that would help a lot with her blood sugar checks. Because of her GI issues she feels that complicates things and she does not know when it is going high or low.         Patient verbalizes understanding of instructions provided today and agrees to view in MyChart.    Telephone follow up appointment with care management team member scheduled for: 09-08-2021 at 1145 am  Alto Denver RN, MSN, CCM Community Care Coordinator [place]  [facility] Mobile: [phone]

## 2021-07-07 NOTE — Chronic Care Management (AMB) (Signed)
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 call for help - learn about small changes that will make a big difference - learn my personal risk factors - barriers to meeting goals identified - change-talk evoked - choices provided - collaboration with team encouraged - decision-making supported - difficulty of making life-long changes acknowledged - health risks reviewed - problem-solving facilitated - questions answered - readiness for change evaluated - reassurance provided - self-reflection promoted - self-reliance encouraged - verbalization of feelings encouraged Follow Up Plan: Telephone follow up appointment with care management team member scheduled for: 09-08-2021 at 11:45 am      Task: RNCM: Mutually Develop and Royce Macadamia Achievement of [name] Goals Completed 07/07/2021  Outcome: Positive   Note:   Care Management Activities:    - barriers to meeting goals identified - change-talk evoked - choices provided - collaboration with team encouraged - decision-making supported - difficulty of making life-long changes acknowledged - health risks reviewed - problem-solving facilitated - questions answered - readiness for change evaluated - reassurance provided - self-reflection promoted - self-reliance encouraged - verbalization of feelings encouraged         Plan: Telephone follow up appointment with care management team member scheduled for:  09-08-2021 at [place] am  Noreene Larsson RN, MSN, [facility] Mobile: [phone]

## 2021-07-25 ENCOUNTER — Encounter: Payer: Self-pay | Admitting: Urology

## 2021-07-25 ENCOUNTER — Ambulatory Visit: Payer: Medicaid Other | Admitting: Nurse Practitioner

## 2021-07-25 MED ORDER — GEMTESA 75 MG PO TABS
75.0000 mg | ORAL_TABLET | Freq: Every day | ORAL | 11 refills | Status: AC
Start: 2021-07-25 — End: ?

## 2021-07-25 NOTE — Telephone Encounter (Signed)
Sent medication to [facility] for them to re submit prior authorization

## 2021-08-12 ENCOUNTER — Encounter: Payer: Self-pay | Admitting: Nurse Practitioner

## 2021-08-16 ENCOUNTER — Encounter: Payer: Self-pay | Admitting: Nurse Practitioner

## 2021-08-16 ENCOUNTER — Ambulatory Visit (INDEPENDENT_AMBULATORY_CARE_PROVIDER_SITE_OTHER): Payer: Medicaid Other | Admitting: Nurse Practitioner

## 2021-08-16 ENCOUNTER — Other Ambulatory Visit: Payer: Self-pay

## 2021-08-16 VITALS — BP 141/79 | HR 98 | Temp 99.2°F | Wt 229.4 lb

## 2021-08-16 DIAGNOSIS — E1159 Type 2 diabetes mellitus with other circulatory complications: Secondary | ICD-10-CM

## 2021-08-16 DIAGNOSIS — E1069 Type 1 diabetes mellitus with other specified complication: Secondary | ICD-10-CM

## 2021-08-16 DIAGNOSIS — Z6831 Body mass index (BMI) 31.0-31.9, adult: Secondary | ICD-10-CM

## 2021-08-16 DIAGNOSIS — E1029 Type 1 diabetes mellitus with other diabetic kidney complication: Secondary | ICD-10-CM | POA: Diagnosis not present

## 2021-08-16 DIAGNOSIS — F845 Asperger's syndrome: Secondary | ICD-10-CM

## 2021-08-16 DIAGNOSIS — E559 Vitamin D deficiency, unspecified: Secondary | ICD-10-CM

## 2021-08-16 DIAGNOSIS — F17219 Nicotine dependence, cigarettes, with unspecified nicotine-induced disorders: Secondary | ICD-10-CM

## 2021-08-16 DIAGNOSIS — E6609 Other obesity due to excess calories: Secondary | ICD-10-CM

## 2021-08-16 DIAGNOSIS — R809 Proteinuria, unspecified: Secondary | ICD-10-CM

## 2021-08-16 DIAGNOSIS — F445 Conversion disorder with seizures or convulsions: Secondary | ICD-10-CM

## 2021-08-16 DIAGNOSIS — F600 Paranoid personality disorder: Secondary | ICD-10-CM

## 2021-08-16 DIAGNOSIS — E785 Hyperlipidemia, unspecified: Secondary | ICD-10-CM

## 2021-08-16 DIAGNOSIS — I152 Hypertension secondary to endocrine disorders: Secondary | ICD-10-CM

## 2021-08-16 DIAGNOSIS — I4581 Long QT syndrome: Secondary | ICD-10-CM

## 2021-08-16 DIAGNOSIS — E781 Pure hyperglyceridemia: Secondary | ICD-10-CM

## 2021-08-16 LAB — BAYER DCA HB A1C WAIVED: HB A1C (BAYER DCA - WAIVED): 8 % — ABNORMAL HIGH (ref 4.8–5.6)

## 2021-08-16 NOTE — Assessment & Plan Note (Signed)
Chronic, stable.  Will collaborate with psychiatry ongoing. 

## 2021-08-16 NOTE — Assessment & Plan Note (Signed)
Chronic, ongoing, followed by endo, recent noted reviewed.  Currently insulin dependent for some time.  A1C today 8% (downward trend from previous >14%) and urine ALB 80 with A:C <30 in May 2022, continue Losartan for kidney protection.  Continue current medication regimen at this time, maintain Metformin dose.  Recommend she continue to monitor BS consistently at home, four times a day.  Use Freestyle Libre as ordered by endo.  Will benefit from ongoing endo guidance due to uncontrolled sugars after having her baby and HIGHLY recommend she continue visits with them.  ? Insulin pump need for more consistent control. Return in 3 months.

## 2021-08-16 NOTE — Patient Instructions (Signed)
Diabetes Mellitus and Nutrition, Adult When you have diabetes, or diabetes mellitus, it is very important to have healthy eating habits because your blood sugar (glucose) levels are greatly affected by what you eat and drink. Eating healthy foods in the right amounts, at about the same times every day, can help you:  Control your blood glucose.  Lower your risk of heart disease.  Improve your blood pressure.  Reach or maintain a healthy weight. What can affect my meal plan? Every person with diabetes is different, and each person has different needs for a meal plan. Your health care provider may recommend that you work with a dietitian to make a meal plan that is best for you. Your meal plan may vary depending on factors such as:  The calories you need.  The medicines you take.  Your weight.  Your blood glucose, blood pressure, and cholesterol levels.  Your activity level.  Other health conditions you have, such as heart or kidney disease. How do carbohydrates affect me? Carbohydrates, also called carbs, affect your blood glucose level more than any other type of food. Eating carbs naturally raises the amount of glucose in your blood. Carb counting is a method for keeping track of how many carbs you eat. Counting carbs is important to keep your blood glucose at a healthy level, especially if you use insulin or take certain oral diabetes medicines. It is important to know how many carbs you can safely have in each meal. This is different for every person. Your dietitian can help you calculate how many carbs you should have at each meal and for each snack. How does alcohol affect me? Alcohol can cause a sudden decrease in blood glucose (hypoglycemia), especially if you use insulin or take certain oral diabetes medicines. Hypoglycemia can be a life-threatening condition. Symptoms of hypoglycemia, such as sleepiness, dizziness, and confusion, are similar to symptoms of having too much  alcohol.  Do not drink alcohol if: ? Your health care provider tells you not to drink. ? You are pregnant, may be pregnant, or are planning to become pregnant.  If you drink alcohol: ? Do not drink on an empty stomach. ? Limit how much you use to:  0-1 drink a day for women.  0-2 drinks a day for men. ? Be aware of how much alcohol is in your drink. In the [place] one drink equals one 12 oz bottle of beer (355 mL), one 5 oz glass of wine (148 mL), or one 1 oz glass of hard liquor (44 mL). ? Keep yourself hydrated with water, diet soda, or unsweetened iced tea.  Keep in mind that regular soda, juice, and other mixers may contain a lot of sugar and must be counted as carbs. What are tips for following this plan? Reading food labels  Start by checking the serving size on the "Nutrition Facts" label of packaged foods and drinks. The amount of calories, carbs, fats, and other nutrients listed on the label is based on one serving of the item. Many items contain more than one serving per package.  Check the total grams (g) of carbs in one serving. You can calculate the number of servings of carbs in one serving by dividing the total carbs by 15. For example, if a food has 30 g of total carbs per serving, it would be equal to 2 servings of carbs.  Check the number of grams (g) of saturated fats and trans fats in one serving. Choose foods that have   a low amount or none of these fats.  Check the number of milligrams (mg) of salt (sodium) in one serving. Most people should limit total sodium intake to less than 2,300 mg per day.  Always check the nutrition information of foods labeled as "low-fat" or "nonfat." These foods may be higher in added sugar or refined carbs and should be avoided.  Talk to your dietitian to identify your daily goals for nutrients listed on the label. Shopping  Avoid buying canned, pre-made, or processed foods. These foods tend to be high in fat, sodium, and added  sugar.  Shop around the outside edge of the grocery store. This is where you will most often find fresh fruits and vegetables, bulk grains, fresh meats, and fresh dairy. Cooking  Use low-heat cooking methods, such as baking, instead of high-heat cooking methods like deep frying.  Cook using healthy oils, such as olive, canola, or sunflower oil.  Avoid cooking with butter, cream, or high-fat meats. Meal planning  Eat meals and snacks regularly, preferably at the same times every day. Avoid going long periods of time without eating.  Eat foods that are high in fiber, such as fresh fruits, vegetables, beans, and whole grains. Talk with your dietitian about how many servings of carbs you can eat at each meal.  Eat 4-6 oz (112-168 g) of lean protein each day, such as lean meat, chicken, fish, eggs, or tofu. One ounce (oz) of lean protein is equal to: ? 1 oz (28 g) of meat, chicken, or fish. ? 1 egg. ?  cup (62 g) of tofu.  Eat some foods each day that contain healthy fats, such as avocado, nuts, seeds, and fish.   What foods should I eat? Fruits Berries. Apples. Oranges. Peaches. Apricots. Plums. Grapes. Mango. Papaya. Pomegranate. Kiwi. Cherries. Vegetables Lettuce. Spinach. Leafy greens, including kale, chard, collard greens, and mustard greens. Beets. Cauliflower. Cabbage. Broccoli. Carrots. Green beans. Tomatoes. Peppers. Onions. Cucumbers. Brussels sprouts. Grains Whole grains, such as whole-wheat or whole-grain bread, crackers, tortillas, cereal, and pasta. Unsweetened oatmeal. Quinoa. Brown or wild rice. Meats and other proteins Seafood. Poultry without skin. Lean cuts of poultry and beef. Tofu. Nuts. Seeds. Dairy Low-fat or fat-free dairy products such as milk, yogurt, and cheese. The items listed above may not be a complete list of foods and beverages you can eat. Contact a dietitian for more information. What foods should I avoid? Fruits Fruits canned with  syrup. Vegetables Canned vegetables. Frozen vegetables with butter or cream sauce. Grains Refined white flour and flour products such as bread, pasta, snack foods, and cereals. Avoid all processed foods. Meats and other proteins Fatty cuts of meat. Poultry with skin. Breaded or fried meats. Processed meat. Avoid saturated fats. Dairy Full-fat yogurt, cheese, or milk. Beverages Sweetened drinks, such as soda or iced tea. The items listed above may not be a complete list of foods and beverages you should avoid. Contact a dietitian for more information. Questions to ask a health care provider  Do I need to meet with a diabetes educator?  Do I need to meet with a dietitian?  What number can I call if I have questions?  When are the best times to check my blood glucose? Where to find more information:  American Diabetes Association: diabetes.org  Academy of Nutrition and Dietetics: www.eatright.org  National Institute of Diabetes and Digestive and Kidney Diseases: www.niddk.nih.gov  Association of Diabetes Care and Education Specialists: www.diabeteseducator.org Summary  It is important to have healthy eating   habits because your blood sugar (glucose) levels are greatly affected by what you eat and drink.  A healthy meal plan will help you control your blood glucose and maintain a healthy lifestyle.  Your health care provider may recommend that you work with a dietitian to make a meal plan that is best for you.  Keep in mind that carbohydrates (carbs) and alcohol have immediate effects on your blood glucose levels. It is important to count carbs and to use alcohol carefully. This information is not intended to replace advice given to you by your health care provider. Make sure you discuss any questions you have with your health care provider. Document Revised: 10/20/2019 Document Reviewed: 10/20/2019 Elsevier Patient Education  2021 Elsevier Inc.  

## 2021-08-16 NOTE — Assessment & Plan Note (Signed)
Chronic, ongoing.  No current statin.  May add this on in future for benefit with her diabetes, she refuses at this time due to past side effects.  Plan on lipid panel today.  Currently taking Fenofibrate due to elevation in trigs, although continues to have poor control.  Check lipid panel today and will consider change to Gemfibrozil and Crestor, which previously took, if ongoing elevations.

## 2021-08-16 NOTE — Assessment & Plan Note (Signed)
I have recommended complete cessation of tobacco use. I have discussed various options available for assistance with tobacco cessation including over the counter methods (Nicotine gum, patch and lozenges). We also discussed prescription options (Chantix, Nicotine Inhaler / Nasal Spray). The patient is not interested in pursuing any prescription tobacco cessation options at this time.  

## 2021-08-16 NOTE — Assessment & Plan Note (Signed)
Chronic, ongoing with BP at goal today.  At this time will continue Clonidine, which is for mood and HTN, and Losartan which offers kidney protection. Per her report has tried multiple different medications in past and BP would drop - will maintain current medications at this time and get back into cardiology.  Urine ALB up to date, continue Losartan.  Is tolerating this.  Recommend she monitor BP at home a few days a week and document + focus on DASH diet at home.  CMP today.  Return in 3 months for follow-up.
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
## 2021-08-16 NOTE — Assessment & Plan Note (Signed)
Chronic, ongoing.  Continue Klonopin and Clonidine + Risperidone as prescribed by psychiatry.  Continue collaboration with psychiatry.  Denies SI/HI.

## 2021-08-16 NOTE — Assessment & Plan Note (Signed)
Referral back to cardiology due to history.

## 2021-08-16 NOTE — Assessment & Plan Note (Signed)
Chronic, ongoing.  Continue neurology visits, appreciate recommendations, patient has long standing history of seizure disorder.

## 2021-08-16 NOTE — Assessment & Plan Note (Signed)
BMI 38.17.  Referral to weight management placed.  Can not use Contrave due to Wellbutrin and risk for increased seizures + can not use GLP 1 due to risk pancreatitis.  Recommended eating smaller high protein, low fat meals more frequently and exercising 30 mins a day 5 times a week with a goal of 10-15lb weight loss in the next 3 months. Patient voiced their understanding and motivation to adhere to these recommendations.

## 2021-08-16 NOTE — Assessment & Plan Note (Signed)
Ongoing issues with last check >1000, ongoing issue and refuses return to Gemi and Crestor due to past hisotry.  Suspect past underlying reason for pancreatitis.  Currently taking Fenofibrate due to elevation in trigs.  Check lipid panel today and will consider change to Gemfibrozil and Crestor, which previously took, if ongoing elevations.  Referral to cardiology for further guidance.  Is on Risperidone, which can cause elevations, but does not wish to change this.

## 2021-08-17 ENCOUNTER — Encounter: Payer: Self-pay | Admitting: Nurse Practitioner

## 2021-08-17 DIAGNOSIS — E559 Vitamin D deficiency, unspecified: Secondary | ICD-10-CM | POA: Insufficient documentation

## 2021-08-17 LAB — VITAMIN D 25 HYDROXY (VIT D DEFICIENCY, FRACTURES): Vit D, 25-Hydroxy: 25.2 ng/mL — ABNORMAL LOW (ref 30.0–100.0)

## 2021-08-17 LAB — COMPREHENSIVE METABOLIC PANEL
ALT: 96 IU/L — ABNORMAL HIGH (ref 0–32)
AST: 80 IU/L — ABNORMAL HIGH (ref 0–40)
Albumin/Globulin Ratio: 1.9 (ref 1.2–2.2)
Albumin: 4.6 g/dL (ref 3.9–5.0)
Alkaline Phosphatase: 80 IU/L (ref 44–121)
BUN/Creatinine Ratio: 12 (ref 9–23)
BUN: 9 mg/dL (ref 6–20)
Bilirubin Total: <0.2 mg/dL (ref 0.0–1.2)
CO2: 25 mmol/L (ref 20–29)
Calcium: 10.1 mg/dL (ref 8.7–10.2)
Chloride: 101 mmol/L (ref 96–106)
Creatinine, Ser: 0.77 mg/dL (ref 0.57–1.00)
Globulin, Total: 2.4 g/dL (ref 1.5–4.5)
Glucose: 82 mg/dL (ref 65–99)
Potassium: 4 mmol/L (ref 3.5–5.2)
Sodium: 140 mmol/L (ref 134–144)
Total Protein: 7 g/dL (ref 6.0–8.5)
eGFR: 107 mL/min/{1.73_m2} (ref >59)

## 2021-08-17 LAB — LIPID PANEL W/O CHOL/HDL RATIO
Cholesterol, Total: 256 mg/dL — ABNORMAL HIGH (ref 100–199)
HDL: 32 mg/dL — ABNORMAL LOW (ref >39)
LDL Chol Calc (NIH): 146 mg/dL — ABNORMAL HIGH (ref 0–99)
Triglycerides: 417 mg/dL — ABNORMAL HIGH (ref 0–149)
VLDL Cholesterol Cal: 78 mg/dL — ABNORMAL HIGH (ref 5–40)

## 2021-08-17 NOTE — Progress Notes (Signed)
Contacted via MyChart   Good morning Joy [name], your labs have returned.  I have to say I am excited, your triglycerides have gone down from 1248 to 417 -- that is a great shift down.  I still recommend starting Crestor which you have been on before to help better lower levels though -- will see cardiology recommendation too.  Kidney function stable.  Liver function a little elevated, AST and ALT, we will continue to monitor this.  Vitamin D only mildly low, I would recommend starting to take Vitamin D3 2000 units daily.  Any questions? Keep being amazing!!  Thank you for allowing me to participate in your care.  I appreciate you. Kindest regards, Raylan Hanton

## 2021-08-21 ENCOUNTER — Other Ambulatory Visit: Payer: Medicaid Other | Admitting: Urology

## 2021-08-28 DIAGNOSIS — O24113 Pre-existing diabetes mellitus, type 2, in pregnancy, third trimester: Secondary | ICD-10-CM

## 2021-08-28 MED ORDER — ALBUTEROL SULFATE HFA 108 (90 BASE) MCG/ACT IN AERS: INHALATION_SPRAY | RESPIRATORY_TRACT | 5 refills | Status: AC

## 2021-08-28 MED ORDER — PEN NEEDLES 31G X 6 MM MISC: 1.0000 | Freq: Four times a day (QID) | 11 refills | Status: AC

## 2021-08-28 MED ORDER — PROMETHAZINE HCL 12.5 MG PO TABS: 12.5000 mg | ORAL_TABLET | Freq: Four times a day (QID) | ORAL | 12 refills | Status: AC | PRN

## 2021-08-29 ENCOUNTER — Telehealth: Payer: Self-pay | Admitting: Urology

## 2021-08-29 NOTE — Telephone Encounter (Signed)
Patient was given samples of Gemtesa and her insurance still will not approve her prescription, so she would like to know what to do about this and can she get more samples. She would like a call back in regards to this matter.

## 2021-08-31 NOTE — Telephone Encounter (Signed)
Left message for pt

## 2021-09-01 ENCOUNTER — Telehealth: Payer: Medicaid Other | Admitting: General Practice

## 2021-09-01 ENCOUNTER — Ambulatory Visit: Payer: Self-pay

## 2021-09-01 DIAGNOSIS — I152 Hypertension secondary to endocrine disorders: Secondary | ICD-10-CM

## 2021-09-01 DIAGNOSIS — E785 Hyperlipidemia, unspecified: Secondary | ICD-10-CM

## 2021-09-01 DIAGNOSIS — R809 Proteinuria, unspecified: Secondary | ICD-10-CM

## 2021-09-01 DIAGNOSIS — E781 Pure hyperglyceridemia: Secondary | ICD-10-CM

## 2021-09-01 DIAGNOSIS — E1159 Type 2 diabetes mellitus with other circulatory complications: Secondary | ICD-10-CM

## 2021-09-01 DIAGNOSIS — F339 Major depressive disorder, recurrent, unspecified: Secondary | ICD-10-CM

## 2021-09-01 DIAGNOSIS — F419 Anxiety disorder, unspecified: Secondary | ICD-10-CM

## 2021-09-01 DIAGNOSIS — E1069 Type 1 diabetes mellitus with other specified complication: Secondary | ICD-10-CM

## 2021-09-01 DIAGNOSIS — E1029 Type 1 diabetes mellitus with other diabetic kidney complication: Secondary | ICD-10-CM

## 2021-09-01 DIAGNOSIS — E559 Vitamin D deficiency, unspecified: Secondary | ICD-10-CM

## 2021-09-01 NOTE — Patient Instructions (Signed)
Visit Information   Goals Addressed             This Visit's Progress    RNCM: Monitor and Manage My Blood Sugar-Diabetes Type 1       Timeframe:  Long-Range Goal Priority:  High Start Date:    07-07-2021                         Expected End Date:   07-07-2022                    Follow Up Date 11-10-2021    - check blood sugar at prescribed times - check blood sugar before and after exercise - check blood sugar if I feel it is too high or too low - enter blood sugar readings and medication or insulin into daily log - take the blood sugar log to all doctor visits - take the blood sugar meter to all doctor visits    Why is this important?   Checking your blood sugar at home helps to keep it from getting very high or very low.  Writing the results in a diary or log helps the doctor know how to care for you.  Your blood sugar log should have the time, the date and the results.  Also, write down the amount of insulin or other medicine you take.  Other information like what you ate, exercise done and how you were feeling will also be helpful..     Notes: 07-07-2021: The patient states she is checking her blood sugars multiple times a day but is going through a lot of strips. Was managing better with when she had the freestyle libre but her insurance denied it. Is trying to get re-approved for the freestyle libre. Education on the need to monitor blood sugars and watch drinks with sugar in them. Will continue to monitor. 09-01-2021: The patient has her freestyle libre sensors now. The patient states that she is more accountable and her blood sugars are much better now that. Her endocrinologist did make changes to medications. She states she needs to make sure she does not run out of her sensors. She is managing her malabsorption issues better and knows what works for her. States she knows when she is not feeling well that it impacts her glucose readings. Education and support given.      RNCM:  Obtain Eye Exam-Diabetes Type 1       Timeframe:  Long-Range Goal Priority:  High Start Date:      07-07-2021                       Expected End Date:   07-07-2022                    Follow Up Date 11-10-2021    - schedule appointment with eye doctor    Why is this important?   Eye check-ups are important when you have diabetes.  Vision loss can be prevented.    Notes: 07-07-2021: The patient had an eye exam last year. Knows she needs to schedule another eye exam. Review and reminded the patient to call and get an eye appointment. Education on the importance of eye exams with DM. Wants to go to a different eye doctor as she was not pleased with the one she went to last year.      RNCM: Perform Foot Care-Diabetes Type 1  Timeframe:  Long-Range Goal Priority:  High Start Date:      07-07-2021                       Expected End Date:    07-07-2022                   Follow Up Date 11-10-2021    - check feet daily for cuts, sores or redness - keep feet up while sitting - trim toenails straight across - wash and dry feet carefully every day - wear comfortable, cotton socks - wear comfortable, well-fitting shoes    Why is this important?   Good foot care is very important when you have diabetes.  There are many things you can do to keep your feet healthy and catch a problem early.    Notes: 07-07-2021: Had foot exam on 06-28-2021 at endocrinologist office. The patient has been referred to a podiatrist and has an upcoming appointment with the for evaluation of feet that are dry and cracking. Education and support given. 09-01-2021: Has see endocrinologist and pcp. Knows to monitor for changes in feet. Monitor for cuts, bruising, and other concerns that may cause problems for feet.      RNCM: Set My Target A1C       Timeframe:  Short-Term Goal Priority:  High Start Date:      07-07-2021                       Expected End Date:    11-14-2021                   Follow Up Date 11/10/2021     - set target A1C    Lab Results  Component Value Date   HGBA1C 8.0 (H) 08/16/2021    Why is this important?   Your target A1C is decided together by you and your doctor.  It is based on several things like your age and other health issues.    Notes: 07-07-2021: The patient had a hemoglobin A1C of >14 in May. Discussed the importance of getting A1C down. The patient feels if she can get the free style Josephine Igo approved that would help a lot with her blood sugar checks. Because of her GI issues she feels that complicates things and she does not know when it is going high or low. 09-01-2021: Praised the patient for getting her hemoglobin A1C down. Education on continuing to work toward maintaining healthy glucose levels. Will continue to monitor for changes.         Patient verbalizes understanding of instructions provided today and agrees to view in MyChart.   Telephone follow up appointment with care management team member scheduled for: 11-10-2021 at 1030 am  Alto Denver RN, MSN, CCM Community Care Coordinator [place]  [facility] Mobile: [phone]

## 2021-09-01 NOTE — Chronic Care Management (AMB) (Signed)
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 well for her. It made her feel worse. She is planning on getting some fish oil and start taking. Also ask about Vitamin D3 recommendation by the pcp. The patient is planning on getting some Vitamin D3 and taking. Education on monitoring dietary intake and following a heart heatlhy/ADA diet. The patient wants to do the right things to maintain her health and well being.  Advised patient to call office for changes or questions and to write down questions for the pcp for next office visit.  Provided education to patient re: heart healthy/ADA diet and effective management of cholesterol. Gave options on food choices and exercise and weight loss.  Reviewed medications with patient and discussed compliance and statins Reviewed scheduled/upcoming provider appointments including: Saw pcp on 08-16-2021 and had new lab work.  Discussed plans with patient for ongoing care management follow up and provided patient with direct contact information for care management  team Patient Goals/Self-Care Activities: - call for medicine refill 2 or 3 days before it runs out - call if I am sick and can't take my medicine - keep a list of all the medicines I take; vitamins and herbals too - learn to read medicine labels - use a pillbox to sort medicine - use an alarm clock or phone to remind me to take my medicine - change to whole grain breads, cereal, pasta - drink 6 to 8 glasses of water each day - eat 3 to 5 servings of fruits and vegetables each day - eat 5 or 6 small meals each day - eat fish at least once per  week - fill half the plate with nonstarchy vegetables - limit fast food meals to no more than 1 per week - manage portion size - prepare main meal at home 3 to 5 days each week - read food labels for fat, fiber, carbohydrates and portion size - reduce red meat to 2 to 3 times a week - set a realistic goal - be open to making changes - I can manage, know and watch for signs of a heart attack - if I have chest pain, call for help - learn about small changes that will make a big difference - learn my personal risk factors - barriers to meeting goals identified - change-talk evoked - choices provided - collaboration with team encouraged - decision-making supported - difficulty of making life-long changes acknowledged - health risks reviewed - problem-solving facilitated - questions answered - readiness for change evaluated - reassurance provided - self-reflection promoted - self-reliance encouraged - verbalization of feelings encouraged Follow Up Plan: Telephone follow up appointment with care management team member scheduled for: 11-10-2021 at 1030 am       Plan: Telephone follow up appointment with care management team member scheduled for:  11-10-2021 at 109 am  Noreene Larsson RN, MSN, [facility] Mobile: [phone]

## 2021-09-04 NOTE — Telephone Encounter (Signed)
Pt LM on triage line stating that she is out of Gemtesa and has not heard anything on the approval of the medication from her insurance. She is out of samples. She requests call back.

## 2021-09-04 NOTE — Telephone Encounter (Signed)
Pt aware. Appt made

## 2021-09-08 ENCOUNTER — Telehealth: Payer: Medicaid Other

## 2021-09-17 ENCOUNTER — Encounter: Payer: Self-pay | Admitting: Urology

## 2021-09-18 ENCOUNTER — Other Ambulatory Visit: Payer: Medicaid Other | Admitting: Urology

## 2021-11-10 ENCOUNTER — Telehealth: Payer: Self-pay

## 2021-11-10 ENCOUNTER — Telehealth: Payer: Medicaid Other

## 2021-11-10 NOTE — Telephone Encounter (Signed)
°  Care Management   Follow Up Note   11/10/2021 Name: Joy [name] MRN: [id number] DOB: 08-May-1991   Referred by: [name], NP Reason for referral : Care Coordination (RNCM: Follow up for Chronic Disease Management and Care Coordination Needs )   An unsuccessful telephone outreach was attempted today. The patient was referred to the case management team for assistance with care management and care coordination. The number is a non-working number and notes in the chart indicate the patient has moved to [facility].   Follow Up Plan: The care management team will reach out to the patient again over the next 30 days. Will see if care guide scheduler can reach the patient to reschedule appointment.   Alto Denver RN, MSN, CCM Community Care Coordinator [place]   [facility] Mobile: [phone]

## 2021-11-14 ENCOUNTER — Telehealth: Payer: Self-pay

## 2021-11-14 NOTE — Chronic Care Management (AMB) (Signed)
°  Care Management   Note  11/14/2021 Name: [name] MRN: [id number] DOB: Jul 29, 1991  Joy [name] is a 30 y.o. year old female who is a primary care patient of [name], NP and is actively engaged with the care management team. I reached out to [name] by phone today to assist with re-scheduling a follow up visit with the RN Case Manager  Follow up plan: Unsuccessful telephone outreach attempt made.  The care management team will reach out to the patient again over the next 7 days.   Penne Lash, RMA Care Guide, Embedded Care Coordination [facility]  [place], [place] [address] Direct Dial: [phone] [email or web address] Website: [email or web address]

## 2021-12-19 ENCOUNTER — Ambulatory Visit (INDEPENDENT_AMBULATORY_CARE_PROVIDER_SITE_OTHER): Payer: Self-pay | Admitting: Neurology

## 2021-12-20 NOTE — Chronic Care Management (AMB) (Signed)
°  Care Management   Note  12/20/2021 Name: Joy [name] MRN: [id number] DOB: 1991-09-20  Joy [name] is a 31 y.o. year old female who is a primary care patient of Venita Lick, NP and is actively engaged with the care management team. I reached out to Marin Comment by phone today to assist with re-scheduling a follow up visit with the RN Case Manager  Follow up plan: Unsuccessful telephone outreach attempt made. The care management team will reach out to the patient again over the next 7 days.  If patient returns call to provider office, please advise to call [facility]  at [place], [place], [place], [place] [address] Direct Dial: [phone] [email or web address]

## 2022-01-10 NOTE — Telephone Encounter (Signed)
3rd unsuccessful outreach  

## 2022-01-10 NOTE — Chronic Care Management (AMB) (Signed)
°  Care Management   Note  01/10/2022 Name: Joy [name] MRN: [id number] DOB: 23-Jun-1991  Joy [name] is a 31 y.o. year old female who is a primary care patient of [name], NP and is actively engaged with the care management team. I reached out to [name] by phone today to assist with re-scheduling a follow up visit with the RN Case Manager  Follow up plan: Unable to make contact on outreach attempts x 3. PCP [name], NP notified via routed documentation in medical record.   Penne Lash, RMA Care Guide, Embedded Care Coordination [facility]  [place], [place] [address] Direct Dial: [phone] [email or web address]: [email or web address]

## 2022-02-13 ENCOUNTER — Ambulatory Visit (INDEPENDENT_AMBULATORY_CARE_PROVIDER_SITE_OTHER): Payer: Self-pay | Admitting: Neurology

## 2022-03-08 ENCOUNTER — Ambulatory Visit (INDEPENDENT_AMBULATORY_CARE_PROVIDER_SITE_OTHER): Payer: Self-pay | Admitting: CARDIOVASCULAR DISEASE

## 2022-03-16 ENCOUNTER — Ambulatory Visit: Payer: Medicaid Other | Admitting: Radiation Oncology

## 2022-08-26 HISTORY — PX: HX CORONARY STENT PLACEMENT: SHX49

## 2022-12-19 ENCOUNTER — Other Ambulatory Visit (HOSPITAL_COMMUNITY): Payer: Self-pay | Admitting: NURSE PRACTITIONER

## 2022-12-19 DIAGNOSIS — E049 Nontoxic goiter, unspecified: Secondary | ICD-10-CM

## 2022-12-31 ENCOUNTER — Ambulatory Visit (HOSPITAL_COMMUNITY): Payer: Self-pay

## 2022-12-31 NOTE — Telephone Encounter (Signed)
Paper referral received from [facility] for psychiatry. Pt placed on psychiatry waiting list.    Corley. Colletta Maryland, CASE MANAGER

## 2023-01-07 ENCOUNTER — Ambulatory Visit (HOSPITAL_COMMUNITY): Payer: Self-pay

## 2023-01-25 ENCOUNTER — Ambulatory Visit (HOSPITAL_COMMUNITY): Payer: Self-pay

## 2023-01-29 ENCOUNTER — Ambulatory Visit: Payer: Medicare Other | Attending: Neurology | Admitting: Neurology

## 2023-01-29 ENCOUNTER — Other Ambulatory Visit: Payer: Self-pay

## 2023-01-29 ENCOUNTER — Encounter (INDEPENDENT_AMBULATORY_CARE_PROVIDER_SITE_OTHER): Payer: Self-pay | Admitting: Neurology

## 2023-01-29 VITALS — BP 130/68 | HR 97 | Temp 96.8°F | Wt 244.0 lb

## 2023-01-29 DIAGNOSIS — G40802 Other epilepsy, not intractable, without status epilepticus: Secondary | ICD-10-CM

## 2023-01-29 DIAGNOSIS — R569 Unspecified convulsions: Secondary | ICD-10-CM | POA: Insufficient documentation
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
## 2023-01-29 NOTE — Patient Instructions (Signed)
Call 1-2 days after test for results, instructions and follow up.

## 2023-02-13 ENCOUNTER — Inpatient Hospital Stay
Admission: RE | Admit: 2023-02-13 | Discharge: 2023-02-13 | Disposition: A | Discharge status: Home / Self Care | Payer: Medicare Other | Source: Ambulatory Visit | Attending: NURSE PRACTITIONER | Admitting: NURSE PRACTITIONER

## 2023-02-13 ENCOUNTER — Other Ambulatory Visit: Payer: Self-pay

## 2023-02-13 DIAGNOSIS — E049 Nontoxic goiter, unspecified: Secondary | ICD-10-CM | POA: Insufficient documentation

## 2023-02-18 NOTE — Telephone Encounter (Signed)
Sent in on 09-20-22 for #180 with RFs to mail order pharmacy. Takes two tabs BID. Refill needs to be for 360#.    Reece Levy, RN

## 2023-02-21 ENCOUNTER — Other Ambulatory Visit: Payer: Medicare Other | Attending: Obstetrics & Gynecology

## 2023-02-21 ENCOUNTER — Other Ambulatory Visit: Payer: Self-pay

## 2023-02-21 DIAGNOSIS — N912 Amenorrhea, unspecified: Secondary | ICD-10-CM

## 2023-02-21 LAB — TRIIODOTHYRONINE, TOTAL (TOTAL T3): T3 TOTAL: 100 ng/dL (ref 80–200)

## 2023-02-21 LAB — LH: LUTEINIZING HORMONE: 4.1 m[IU]/mL

## 2023-02-21 LAB — HCG, SERUM QUALITATIVE, PREGNANCY: PREGNANCY, SERUM QUALITATIVE: NEGATIVE

## 2023-02-21 LAB — FSH: FSH: 3.6 m[IU]/mL

## 2023-02-21 LAB — PROLACTIN: PROLACTIN: 35.1 ng/mL — ABNORMAL HIGH (ref 5.2–26.5)

## 2023-02-21 LAB — PROGESTERONE: PROGESTERONE: <0.5 ng/mL

## 2023-02-21 LAB — FERRITIN: FERRITIN: 68 ng/mL (ref 5–200)

## 2023-02-21 LAB — THYROID STIMULATING HORMONE (SENSITIVE TSH): TSH: 2.435 u[IU]/mL (ref 0.490–4.670)

## 2023-02-23 LAB — DEHYDROEPIANDROSTERONE SULFATE (DHEA-S), SERUM: DHEA-SULFATE: 256 ug/dL (ref 18–391)

## 2023-02-24 LAB — TESTOSTERONE FREE (DIALYSIS) AND TOTAL,MS
TESTOSTERONE, FREE: 6.5 pg/mL — ABNORMAL HIGH (ref 0.1–6.4)
TESTOSTERONE,TOTAL,LC/MS/MS: 35 ng/dL (ref 2–45)

## 2023-03-13 ENCOUNTER — Inpatient Hospital Stay
Admission: RE | Admit: 2023-03-13 | Discharge: 2023-03-13 | Disposition: A | Discharge status: Home / Self Care | Payer: Medicare Other | Source: Ambulatory Visit | Attending: Neurology | Admitting: Neurology

## 2023-03-13 ENCOUNTER — Other Ambulatory Visit: Payer: Self-pay

## 2023-03-13 DIAGNOSIS — R569 Unspecified convulsions: Secondary | ICD-10-CM | POA: Insufficient documentation

## 2023-03-13 NOTE — Procedures (Signed)
[facility]    Neurodiagnostics Report  Date of Service: 03/13/2023    Patient Name: Tahlia Deamer    MRN: [id number]   Ordering Physician/Provider: Dr. Billie Ruddy    Study Time: 03/13/2023 from 12:58 to 14:02      INTRODUCTION:  Reason for Study: Lurdes Haltiwanger is a 32 y.o. female being evaluated for seizure-like events.   Patient State: Awake  Pertinent Medications: None        EEG DESCRIPTION: This EEG was acquired using the standard international 10-20 system of electrode placement.     EEG FINDINGS:  Background  Occipital Rhythm (posterior dominant rhythm, or PDR): present    Frequency (Hz): 12   Voltage: medium   Organization: good   Reactivity to eye opening/closure: good      Sleep: N2 with vertex waves, K complexes, sleep spindles    Activation Procedures:   Hyperventilation(HV): Not performed   Photic Stimulation(PS): Performed       Response to PS: Driving response          ABNORMALITIES: None        EEG Diagnosis: Normal awake, drowsy and sleep EEG.           _________________________  April Manson, MD       I have reviewed the EEG and agree with the findings of this report. Any additions, exceptions or clarifications are noted above.    Rise Patience, MD  Assistant Professor of Neurology   [facility]   Attending Epileptologist

## 2023-03-18 ENCOUNTER — Other Ambulatory Visit (HOSPITAL_COMMUNITY): Payer: Self-pay | Admitting: NURSE PRACTITIONER

## 2023-03-18 DIAGNOSIS — T8141XA Infection following a procedure, superficial incisional surgical site, initial encounter: Secondary | ICD-10-CM

## 2023-04-11 ENCOUNTER — Other Ambulatory Visit (HOSPITAL_COMMUNITY): Payer: Self-pay | Admitting: NURSE PRACTITIONER

## 2023-04-11 ENCOUNTER — Ambulatory Visit
Admission: RE | Admit: 2023-04-11 | Discharge: 2023-04-11 | Disposition: A | Discharge status: Home / Self Care | Payer: Medicare Other | Source: Ambulatory Visit | Attending: NURSE PRACTITIONER | Admitting: NURSE PRACTITIONER

## 2023-04-11 ENCOUNTER — Other Ambulatory Visit: Payer: Self-pay

## 2023-04-11 DIAGNOSIS — T8141XA Infection following a procedure, superficial incisional surgical site, initial encounter: Secondary | ICD-10-CM | POA: Insufficient documentation

## 2023-06-06 ENCOUNTER — Encounter (HOSPITAL_COMMUNITY): Payer: Self-pay | Admitting: NURSE PRACTITIONER

## 2023-06-06 ENCOUNTER — Other Ambulatory Visit (HOSPITAL_COMMUNITY): Payer: Self-pay | Admitting: NURSE PRACTITIONER

## 2023-06-06 DIAGNOSIS — R1013 Epigastric pain: Secondary | ICD-10-CM

## 2023-06-10 NOTE — Telephone Encounter (Signed)
Lanny Hurst, DO 06/07/2023 9:48 PM EDT    Given her history, I don't think it is unreasonable to consider stress testing. Would recommend treadmill nuclear and if she is unable to exercise, they can convert to Lexiscan.     Matt  ----- Message -----  From: Jean Rosenthal, RN  Sent: 06/07/2023 4:31 PM EDT  To: Saul Fordyce, PA; Consuello Closs Cauchi, DO; *  Subject: Pcp suggested I messaged you     ----- Message from Jean Rosenthal, RN sent at 06/07/2023 4:31 PM EDT -----      ----- Message sent from Jean Rosenthal, RN to [name] at 06/07/2023 4:31 PM -----   Maralyn Sago,    I will forward your message to Dr. Arty Baumgartner and Dahlia Client, [place].    Thanks,    Inez Pilgrim, RN      ----- Message -----  From:Iyanla Joycelyn Man  Sent:06/07/2023 4:27 PM EDT  UE:[id number] P Cauchi  Subject:Pcp suggested I messaged you     Hi, I saw my pcp yesterday and after talking to her she suggested that I sent you a message in regards to how I've been feeling lately. My pcp said she recommends I get a stress test done but thought it be better if you ordered it quote "they can make sure it's the right kind"  To summarize lately I've been dealing with excessive sweating even at rest and in front of fans or [facility] which I experienced for a few months before my heart attack and had stopped right after. I also have little energy/get worn out quickly and doing light physical activity for example carrying 3 or 4 bags of groceries in or taking the trash out knocks the wind out of me.    On a different note I had labs done at the end of June, my pcp said she was going to send them to you and I have a copy I will bring to my next appointment.   Thank you for your time and care - Milicent Acheampong

## 2023-07-01 ENCOUNTER — Inpatient Hospital Stay
Admission: RE | Admit: 2023-07-01 | Discharge: 2023-07-01 | Disposition: A | Discharge status: Home / Self Care | Payer: Medicare Other | Source: Ambulatory Visit | Attending: NURSE PRACTITIONER | Admitting: NURSE PRACTITIONER

## 2023-07-01 ENCOUNTER — Other Ambulatory Visit: Payer: Self-pay

## 2023-07-01 ENCOUNTER — Other Ambulatory Visit (HOSPITAL_COMMUNITY): Payer: Self-pay | Admitting: NURSE PRACTITIONER

## 2023-07-01 DIAGNOSIS — K760 Fatty (change of) liver, not elsewhere classified: Secondary | ICD-10-CM | POA: Insufficient documentation

## 2023-07-01 DIAGNOSIS — R1013 Epigastric pain: Secondary | ICD-10-CM

## 2023-07-11 ENCOUNTER — Encounter (INDEPENDENT_AMBULATORY_CARE_PROVIDER_SITE_OTHER): Payer: Self-pay | Admitting: Surgery

## 2023-07-11 ENCOUNTER — Other Ambulatory Visit: Payer: Self-pay

## 2023-07-11 ENCOUNTER — Ambulatory Visit: Payer: Medicare Other | Attending: Surgery | Admitting: Surgery

## 2023-07-11 VITALS — BP 148/82 | HR 94 | Temp 97.3°F | Resp 18 | Ht 65.0 in | Wt 254.0 lb

## 2023-07-11 DIAGNOSIS — R011 Cardiac murmur, unspecified: Secondary | ICD-10-CM | POA: Insufficient documentation

## 2023-07-11 DIAGNOSIS — R609 Edema, unspecified: Secondary | ICD-10-CM | POA: Insufficient documentation

## 2023-07-11 DIAGNOSIS — Z79899 Other long term (current) drug therapy: Secondary | ICD-10-CM | POA: Insufficient documentation

## 2023-07-11 DIAGNOSIS — R1110 Vomiting, unspecified: Secondary | ICD-10-CM | POA: Insufficient documentation

## 2023-07-11 DIAGNOSIS — Z86010 Personal history of colonic polyps: Secondary | ICD-10-CM | POA: Insufficient documentation

## 2023-07-11 DIAGNOSIS — Z800 Family history of malignant neoplasm of digestive organs: Secondary | ICD-10-CM

## 2023-07-11 DIAGNOSIS — K219 Gastro-esophageal reflux disease without esophagitis: Secondary | ICD-10-CM | POA: Insufficient documentation

## 2023-07-11 DIAGNOSIS — I2510 Atherosclerotic heart disease of native coronary artery without angina pectoris: Secondary | ICD-10-CM | POA: Insufficient documentation

## 2023-07-11 DIAGNOSIS — Z955 Presence of coronary angioplasty implant and graft: Secondary | ICD-10-CM | POA: Insufficient documentation

## 2023-07-11 DIAGNOSIS — Z7902 Long term (current) use of antithrombotics/antiplatelets: Secondary | ICD-10-CM | POA: Insufficient documentation

## 2023-07-11 DIAGNOSIS — F17210 Nicotine dependence, cigarettes, uncomplicated: Secondary | ICD-10-CM | POA: Insufficient documentation

## 2023-07-11 DIAGNOSIS — I252 Old myocardial infarction: Secondary | ICD-10-CM | POA: Insufficient documentation

## 2023-07-11 DIAGNOSIS — I10 Essential (primary) hypertension: Secondary | ICD-10-CM | POA: Insufficient documentation

## 2023-07-11 NOTE — H&P (Signed)
GENERAL SURGERY, MEDICAL OFFICE BUILDING  [address]  [place] [address]  Operated by [facility]     New Patient    Patient Name: Kristy Santiago  Date: 07/11/2023  Department:  GENERAL SURGERY, MEDICAL OFFICE BUILDING  MRN: [id number]  DOB: [birth year]  Primary Care Provider:  Pablo Ledger, FNP  Referring Provider: Pablo Ledger, FNP      Chief Complaint:   Chief Complaint   Patient presents with    Colonoscopy     Patient is a new patient here for a colon consult. Her Last Colonoscopy Date: 12 years ago.    Previous Colonoscopy indication was: Abdominal pain,change in bowels.    The results of the colonoscopy was: Polyps    Family Hx of Colon Ca: Yes, mother    Risk Factors Include: History of Colon Polyps         EGD     Patient would also like a EGD done due to heartburn and acid reflux.         History of Present Illness:   Kristy Santiago is a 32 y.o.,female presents today for: colonoscopy.  Her mother had colon cancer.  She says her mother also had appendiceal cancer and thyroid cancer.  Her last colonoscopy was 12 years ago and she had polyps removed.  She was told her colon was "spastic" last time.  She has heartburn and vomiting and she has had this since childhood.  She takes phenergan for this.  She has a history of MI with stents placed in October of last year, about 10 months ago.  She is on DAPT with effient and ASA.    Past Medical History:   Diagnosis Date    Colon polyp     Diabetes mellitus (CMS HCC)     Esophageal reflux     Essential hypertension     Generalized anxiety disorder     NSTEMI (non-ST elevated myocardial infarction) (CMS HCC)      Past Surgical History:   Procedure Laterality Date    CESAREAN SECTION      COLONOSCOPY      ESOPHAGOGASTRODUODENOSCOPY      HX CORONARY STENT PLACEMENT  08/26/2022    HX EAR TUBES       Family Medical History:       Problem Relation (Age of Onset)    Cancer Mother    Diabetes Mother    Heart Attack Father    Heart Disease  Father    Multiple Sclerosis Mother    SLE Mother            Social History     Tobacco Use   Smoking Status Every Day    Current packs/day: 0.50    Types: Cigarettes   Smokeless Tobacco Never      Social History     Substance and Sexual Activity   Alcohol Use Never      Allergies   Allergen Reactions    Invega [Paliperidone] Hives/ Urticaria     Patient also noted irregular heart rate, bloodshot eyes, and feeling unwell.     Outpatient Encounter Medications as of 07/11/2023   Medication Sig Dispense Refill    aspirin (ECOTRIN) 81 mg Oral Tablet, Delayed Release (E.C.) Take 1 Tablet (81 mg total) by mouth Once a day      atorvastatin (LIPITOR) 80 mg Oral Tablet Take 1 Tablet (80 mg total) by mouth Every evening      Baclofen (LIORESAL) 20 mg Oral  Tablet Take 1 Tablet (20 mg total) by mouth Three times a day      carvediloL (COREG) 25 mg Oral Tablet Take 1 Tablet (25 mg total) by mouth Twice daily with food      clonazePAM (KLONOPIN) 0.5 mg Oral Tablet Take 1 Tablet (0.5 mg total) by mouth Twice daily      fenofibrate micronized (LOFIBRA) 200 mg Oral Capsule Take 1 Capsule (200 mg total) by mouth Once a day      hydrOXYzine pamoate (VISTARIL) 50 mg Oral Capsule Take 1 Capsule (50 mg total) by mouth Three times a day as needed for Itching      icosapent ethyL (VASCEPA) 1 gram Oral Capsule Take 2 Capsules (2 g total) by mouth Twice daily      insulin lispro (HUMALOG) 100 units/mL Subcutaneous Injectable Inject 0-12 Units under the skin      losartan potassium, bulk, 100 % Does not apply Powder       MetFORMIN (GLUCOPHAGE) 1,000 mg Oral Tablet Take 1 Tablet (1,000 mg total) by mouth Twice daily with food      nortriptyline (PAMELOR) 10 mg Oral Capsule Take 1 Capsule (10 mg total) by mouth Three times a day      prasugreL (EFFIENT) 10 mg Oral Tablet Take 1 Tablet (10 mg total) by mouth Once a day      promethazine (PHENERGAN) 25 mg Oral Tablet Take 1 Tablet (25 mg total) by mouth Every 6 hours as needed for  Nausea/Vomiting      risperiDONE (RISPERDAL) 1 mg Oral Tablet Take 1 Tablet (1 mg total) by mouth Twice daily      vibegron (GEMTESA) 75 mg Oral Tablet Take 1 Tablet (75 mg total) by mouth Once a day      [DISCONTINUED] insulin degludec (TRESIBA FLEXTOUCH U-200) 200 unit/mL (3 mL) Subcutaneous Insulin Pen Inject under the skin 46 units in morning and at bedtime       No facility-administered encounter medications on file as of 07/11/2023.       ROS      07/11/2023   Review of Systems   Constitutional Negative for Fever;Chills   Respiratory Negative for Cough   Gastrointestinal Positive for Dysphagia;Diarrhea;Constipation;Abdominal Pain;Nausea;Vomiting;Heartburn   Gastrointestinal Negative for Melena;Blood in Stool          Vital Signs  BP (!) 148/82   Pulse 94   Temp 36.3 C (97.3 F)   Resp 18   Ht 1.651 m (5\' 5" )   Wt 115 kg (254 lb)   SpO2 99%   BMI 42.27 kg/m         PHYSICAL EXAMINATION  Basic Exam   General:  Vital Signs are stable, there is no acute distress  Skin:  Warm, dry, hirsute, no evidence of jaundice.  HEENT:  Sclerae are clear, oral mucosa is moist.  Heart:  Heart rate is regular, +systolic murmur.  Respiratory Effort:  Respirations are unlabored.  Lungs:  Lungs are clear to auscultation bilaterally.  Abdomen:  Soft, obese, non-distended. Non-tender to palpation. There are no palpable masses or hernias.  Extremities:  + edema b/l LEs, no calf-tenderness.  Musculoskeletal:  +morbid obesity, no increased kyphosis, no abnormal curvatures  Neurologic:  Awake and oriented.  No focal deficits are appreciated.      Assessment  1. History of colon polyps (Primary)      2. Family history of colon cancer  mother    3. Coronary artery disease, with stent; less than one  year old        Treatment  I reviewed the options with the patient.  I recommend a EGD/ Colonoscopy for further evaluation however given the recent stent she will need to wait the full year and be cleared from cardiology to be off the  effient before having the procedure. She will follow up after she has clearance.  The patient is agreeable to this plan.         Electronically signed by Belinda Block, DO

## 2023-09-19 ENCOUNTER — Other Ambulatory Visit (HOSPITAL_COMMUNITY): Payer: Self-pay

## 2023-09-19 DIAGNOSIS — I214 Non-ST elevation (NSTEMI) myocardial infarction: Secondary | ICD-10-CM

## 2024-01-06 ENCOUNTER — Encounter (HOSPITAL_COMMUNITY): Payer: Self-pay

## 2024-01-06 ENCOUNTER — Ambulatory Visit (HOSPITAL_COMMUNITY): Payer: Self-pay

## 2024-01-06 NOTE — Telephone Encounter (Addendum)
 Winnie contacted pt for scheduling but got a voicemail.     This CM sent pt MyChart message for scheduling.     Scheduling options:    -NPV-[1003] or New MYC Patient Visit-[1558] with Elmyra Ricks ([facility]) or Jari Favre @ [facility] location.    -NPV-[1003] or New MYC Patient Visit-[1558] with Kerin Perna @ [facility] location.    -NPV-[1003] or New MYC Patient Visit-[1558] with next PGY-2, 3 or 4  Resident @ [facility] location.  **(Use BEHAVIORAL MED RESIDENTS-[121] subgroup)    -NPV-[1003] or New MYC Patient Visit-[1558] with PGY-1 Resident @ [facility] location.   **(Use [facility] PGY-1 PSYCHIATRY RESIDENTS-[7155] subgroup)      *[facility] call center/call back number for scheduling- [phone]*    Corley. Judeth Cornfield, CASE MANAGER        Regarding: checking on referral  ----- Message from Ernestina Columbia sent at 01/03/2024  3:24 PM EST -----  Copied From CRM [id number].Kathie Rhodes called checking on a referral from 11/2022. Can she get an update on the referral please.    Thanks, [employer]

## 2024-01-16 ENCOUNTER — Telehealth: Payer: No Typology Code available for payment source | Admitting: PHYSICIAN ASSISTANT

## 2024-01-16 DIAGNOSIS — F411 Generalized anxiety disorder: Secondary | ICD-10-CM

## 2024-01-16 DIAGNOSIS — F845 Asperger's syndrome: Secondary | ICD-10-CM

## 2024-01-16 DIAGNOSIS — Z8659 Personal history of other mental and behavioral disorders: Secondary | ICD-10-CM

## 2024-01-16 MED ORDER — HYDROXYZINE PAMOATE 50 MG CAPSULE
50.0000 mg | ORAL_CAPSULE | Freq: Every evening | ORAL | 3 refills | Status: DC
Start: 2024-01-16 — End: 2024-02-13

## 2024-01-16 MED ORDER — CLONAZEPAM 1 MG TABLET
1.0000 mg | ORAL_TABLET | Freq: Two times a day (BID) | ORAL | 5 refills | Status: DC
Start: 2024-01-16 — End: 2024-04-16

## 2024-01-16 MED ORDER — RISPERIDONE 1 MG TABLET
1.0000 mg | ORAL_TABLET | Freq: Two times a day (BID) | ORAL | 3 refills | Status: DC
Start: 2024-01-16 — End: 2024-06-24

## 2024-01-16 MED ORDER — NORTRIPTYLINE 25 MG CAPSULE: 25.0000 mg | ORAL_CAPSULE | Freq: Every evening | ORAL | 3 refills | Status: DC

## 2024-01-16 NOTE — Progress Notes (Signed)
 New Psychiatry Telemedicine Outpatient Intake  TELEMEDICINE DOCUMENTATION:    Patient Location:  MyChart video visit from home address: [address]  [place] [address]    Patient/family aware of provider location:  yes  Patient/family consent for telemedicine:  yes  Examination observed and performed by:  Jari Favre, PA-C     Patient Name:  Kristy Santiago   MRN:  [id number]  Date of Service:  01/16/2024     Identifying information:  The patient is a 33 year-old female who is seen via telehealth for an intake appointment.    CC: "I have a history of bad anxiety."    History of Present Illness:  The patient reports that she is doing well on the Risperdal. She used to struggle with tics, which have been significantly helped by the Risperdal. Ever since she was in high school, she would see "shadow" figures prior to going to sleep. Since being on the Risperdal, she "has not had hallucinations of any kind." Prior to being on the Risperdal, she would hear voices which would tell her to cut herself. She has not self harmed in several years. The main symptom that she wants to target is her anxiety. She was previously on clonazepam 1 mg BID but dosage was decreased when she was pregnant.     denies  depressed mood, anhedonia, appetite changes, insomnia/ hypersomnia , psychomotor agitation/r retardation, fatigue or loss of energy, feelings of hopelessness, helplessness or worthlessness, poor concentration, poor motivation, indecisiveness, inappropriate guilt or recurrent thoughts of death.  deniespressured speech, excess self confidence, racing thoughts, severe mood swings, excessive energy or excessive spending, increased goal focused activity, long period w/o sleep or increased irritability.  endorses panic attacks with/without agoraphobia, anxiety or excessive worrying.  denies recurrent and persistent thoughts, impulses, or images felt as intrusive and inappropriate that cause anxiety /  distress.  deniesrepetitive behaviors (e.g., hand washing, ordering, checking) or mental acts (e.g., praying, counting, repeating words silently) aimed at preventing or reducing distress/ anxiety.  denies suicidal / homicidal ideation/ intention/ plan.   problem with memory and concentration.  denies paranoia, delusions AH, VH, TI, TB,.  denies nightmares, flashbacks, hyper vigilance, startle phenomena, avoidance or numbing .  Pt currently denies any suicidal or homicidal ideation, intent or plan.    ROS:  Psych: As per HPI  Gen/Const: Denies fevers, chills, night sweats, or weight changes  HEENT: Denies headache, recent visual or hearing changes, change in sense of smell, or dental problems.  Cardio: Denies chest pain, palpitations, or dizziness.  Resp: Denies cough, wheeze, congestion, or shortness of breath.  GI: Denies abdominal pain, nausea or vomiting, reflux, diarrhea or constipation.  GU: Denies dysuria, hematuria  Hematologic/Lymph: Denies easy bruising or frequent infection.  Endo: Denies heat or cold intolerance, denies polyuria  Musculoskeletal: Denies edema or loss of function. Denies muscle pain or tenderness.  Neuro: Denies loss of consciousness, abnormal sensations, or weakness.  Skin: Denies rash or abrasions  All other ROS complaints negative    Past Psychiatric History:  Past providers and diagnoses: Patient previously went to a psychiatric provider in [place].   Medications tried: Patient is currently on Risperdal, Clonazepam, hydroxyzine, and Pamelor. Past medication trials include: Haldol  Past hospitalizations: When patient was in high school   Suicidal attempts/gestures: When patient was in high school     Past Medical History:  Past Medical History:   Diagnosis Date    Colon polyp     Diabetes mellitus (CMS HCC)  Esophageal reflux     Essential hypertension     Generalized anxiety disorder     NSTEMI (non-ST elevated myocardial infarction) (CMS HCC)    Epilepsy    Hypercholesteremia  Hypertriglyceridemia  Aspberger's Syndrome    Family Psychiatric & Medical History:  Diagnoses: "Depression and anxiety are on both my mom and dad's side." Paternal uncle-psychosis   Substance use/abuse: On father's side of the family  Suicidal gestures/attempts: "I don't know specifics but there probably is one."   Family Medical History:       Problem Relation (Age of Onset)    Cancer Mother    Diabetes Mother    Heart Attack Father    Heart Disease Father    Multiple Sclerosis Mother    SLE Mother                 Social History:  The patient does not work, homeschools her daughter. She lives with her mom and stepdad.     Substance use history:   -Tobacco: Smokes 1/2 PPD   -Alcohol: None   -Cannabis: None          Current Medications:  aspirin (ECOTRIN) 81 mg Oral Tablet, Delayed Release (E.C.), Take 1 Tablet (81 mg total) by mouth Once a day  atorvastatin (LIPITOR) 80 mg Oral Tablet, Take 1 Tablet (80 mg total) by mouth Every evening  Baclofen (LIORESAL) 20 mg Oral Tablet, Take 1 Tablet (20 mg total) by mouth Three times a day  carvediloL (COREG) 25 mg Oral Tablet, Take 1 Tablet (25 mg total) by mouth Twice daily with food  clonazePAM (KLONOPIN) 0.5 mg Oral Tablet, Take 1 Tablet (0.5 mg total) by mouth Twice daily  fenofibrate micronized (LOFIBRA) 200 mg Oral Capsule, Take 1 Capsule (200 mg total) by mouth Once a day  hydrOXYzine pamoate (VISTARIL) 50 mg Oral Capsule, Take 1 Capsule (50 mg total) by mouth Three times a day as needed for Itching  icosapent ethyL (VASCEPA) 1 gram Oral Capsule, Take 2 Capsules (2 g total) by mouth Twice daily  insulin lispro (HUMALOG) 100 units/mL Subcutaneous Injectable, Inject 0-12 Units under the skin  losartan potassium, bulk, 100 % Does not apply Powder,   MetFORMIN (GLUCOPHAGE) 1,000 mg Oral Tablet, Take 1 Tablet (1,000 mg total) by mouth Twice daily with food  nortriptyline (PAMELOR) 10 mg Oral Capsule, Take 1 Capsule (10 mg total) by mouth Three times a  day  prasugreL (EFFIENT) 10 mg Oral Tablet, Take 1 Tablet (10 mg total) by mouth Once a day  promethazine (PHENERGAN) 25 mg Oral Tablet, Take 1 Tablet (25 mg total) by mouth Every 6 hours as needed for Nausea/Vomiting  risperiDONE (RISPERDAL) 1 mg Oral Tablet, Take 1 Tablet (1 mg total) by mouth Twice daily  vibegron (GEMTESA) 75 mg Oral Tablet, Take 1 Tablet (75 mg total) by mouth Once a day    No facility-administered medications prior to visit.      Allergies:   Allergies   Allergen Reactions    Invega [Paliperidone] Hives/ Urticaria     Patient also noted irregular heart rate, bloodshot eyes, and feeling unwell.       Objective  General: WNWD female in NAD  Head: Normocephalic and atraumatic  Eyes: ROM intact, PERRL, anicteric  Mouth: Dentition in good repair, MMM  Resp: Unlabored respirations  Neuro: A&O x 4 to person, place, date, and situation. No focal neurological deficits. Gait WNL with appropriate stride and balance.  Skin: warm, dry, intact, no rashes  or abrasions    MENTAL STATUS EXAMINATION:  Appearance: Fair grooming and hygiene, in casual attire, appears about stated age.  Behavior: Calm, pleasant, and cooperative. Appropriate eye-contact.  Speech: Regular in rate, rhythm, tone, and volume.  Mood: "Okay"  Affect: Mood congruent, euthymic, full-range  Thought Process: Logical and linear, no flight of ideas, loose associations, or blocking.  Thought Content: Without suicidal or homicidal ideations  Perceptions: Denies auditory or visual hallucinations. No evidence of paranoid thinking or delusional thought processes. Not responding to internal stimuli.  Memory: Immediate recall, short term, and remote past memory intact.  Language: appropriate recognition and naming. No perseveration or delay in response.  Fund of knowledge: Intact. Intelligence estimated to be  average.  Abstract thinking: Intact  Attention span and concentration: Good  Insight: Good  Judgment:  Good    Labs/Reports:      Assessment:  This patient is a 33 yo female who is seen via telehealth for new patient appointment. Reports anxiety, previously diagnosed with Aspberger Syndrome. Medications were discussed in detail including risks, benefits, and alternatives. The DSM-5 diagnosis is as follows:      ICD-10-CM    1. GAD (generalized anxiety disorder)  F41.1       2. History of psychosis  Z86.59       3. Asperger syndrome  F84.5             Plan:  1. Return to clinic in 4 weeks.  -May return sooner if needed for worsening symptoms or side effects    2. Pharmacological intervention as follows:  -Continue Risperdal 1 mg BID. Patient wishes to have her endocrinologist, cardiologist and PCP monitor her lipid profile and blood glucose levels.   -Switch Pamelor to 25 mg qhs.  -Increase Klonopin to 1 mg BID.  -Continue hydroxyzine 50 mg qhs.     3. Brief supportive therapy provided    4. Psychotherapy discussed     5. Non-pharmacological treatment modalities discussed including diet, exercise, coping skills.    All current medications, including OTC meds, have been reviewed, discussed, and adjusted as clinically appropriate with the patient. The patient expressed an understanding of and agreement with the plan outlined above.      Patient advised to call or return to the clinic for adverse effects or worsening of symptoms. Also discussed availability of [facility] to communicate with this provider. For emergency symptoms also discussed availability of after-hours MARS Line, after-hours [facility] [facility] line, Emergency Department, and 911 which may be used for emergencies including SI/HI or psychosis.  Patient was informed of the 24 hour per day, 7 days a week psychiatric coverage in the emergency room.    Jari Favre, PA-C

## 2024-02-13 ENCOUNTER — Other Ambulatory Visit: Payer: Self-pay

## 2024-02-13 ENCOUNTER — Telehealth: Payer: Self-pay | Admitting: PHYSICIAN ASSISTANT

## 2024-02-13 DIAGNOSIS — F959 Tic disorder, unspecified: Secondary | ICD-10-CM

## 2024-02-13 DIAGNOSIS — Z8659 Personal history of other mental and behavioral disorders: Secondary | ICD-10-CM

## 2024-02-13 DIAGNOSIS — F411 Generalized anxiety disorder: Secondary | ICD-10-CM

## 2024-02-13 DIAGNOSIS — F845 Asperger's syndrome: Secondary | ICD-10-CM

## 2024-02-13 MED ORDER — HYDROXYZINE PAMOATE 100 MG CAPSULE
ORAL_CAPSULE | ORAL | 12 refills | Status: AC
Start: 2024-02-13 — End: ?

## 2024-02-13 NOTE — Progress Notes (Signed)
 Psychiatry Telemedicine Outpatient Follow-up  TELEMEDICINE DOCUMENTATION:    Patient Location:  MyChart video visit from home address: [address]  [place] [address]    Patient/family aware of provider location:  yes  Patient/family consent for telemedicine:  yes  Examination observed and performed by:  Lonnell Rob, PA-C     Date of service: 02/13/2024  Patient Name: Kristy Santiago  MRN: [id number]      CC: Medication follow-up for GAD, Aspberger Syndrome, history of psychosis, tic disorder    Subjective: Pt is a 33 yo female who was last seen for med check appointment on 01/16/2024. At that time, her medication regimen was adjusted as follows:    -Continue Risperdal  1 mg BID. Patient wishes to have her endocrinologist, cardiologist and PCP monitor her lipid profile and blood glucose levels.   -Switch Pamelor  to 25 mg qhs.  -Increase Klonopin  to 1 mg BID.  -Continue hydroxyzine  50 mg qhs.        The patient reports today that she has been  doing better. Her anxiety has been a lot better. She has some nights which are more difficult to fall asleep and stay asleep. The Pamelor  helps her headaches, but she is not sure how it affects her sleep. She cannot sleep if she does not take her hydroxyzine .     Social history: The patient does not work, homeschools her daughter. She lives with her mom and stepdad.        ROS:  Psych: As per HPI    Current medications: aspirin (ECOTRIN) 81 mg Oral Tablet, Delayed Release (E.C.), Take 1 Tablet (81 mg total) by mouth Once a day  atorvastatin (LIPITOR) 80 mg Oral Tablet, Take 1 Tablet (80 mg total) by mouth Every evening  Baclofen (LIORESAL) 20 mg Oral Tablet, Take 1 Tablet (20 mg total) by mouth Three times a day  carvediloL (COREG) 25 mg Oral Tablet, Take 1 Tablet (25 mg total) by mouth Twice daily with food  clonazePAM  (KLONOPIN ) 1 mg Oral Tablet, Take 1 Tablet (1 mg total) by mouth Twice daily  clopidogreL (PLAVIX) 75 mg Oral Tablet, Take 1 Tablet (75 mg total) by mouth  Once a day  ezetimibe (ZETIA) 10 mg Oral Tablet, Take 1 Tablet (10 mg total) by mouth Every evening  fenofibrate micronized (LOFIBRA) 200 mg Oral Capsule, Take 1 Capsule (200 mg total) by mouth Once a day  hydrOXYzine  pamoate (VISTARIL ) 50 mg Oral Capsule, Take 1 Capsule (50 mg total) by mouth Every night  icosapent ethyL (VASCEPA) 1 gram Oral Capsule, Take 2 Capsules (2 g total) by mouth Twice daily  insulin lispro (HUMALOG) 100 units/mL Subcutaneous Injectable, Inject 0-12 Units under the skin  isosorbide mononitrate (IMDUR) 30 mg Oral Tablet Sustained Release 24 hr, Take 1 Tablet (30 mg total) by mouth Every morning  losartan potassium, bulk, 100 % Does not apply Powder,   MetFORMIN (GLUCOPHAGE) 1,000 mg Oral Tablet, Take 1 Tablet (1,000 mg total) by mouth Twice daily with food  nitroGLYCERIN (NITROSTAT) 0.4 mg Sublingual Tablet, Sublingual, Place 1 Tablet (0.4 mg total) under the tongue Every 5 minutes as needed for Chest pain for 3 doses over 15 minutes  nortriptyline  (PAMELOR ) 25 mg Oral Capsule, Take 1 Capsule (25 mg total) by mouth Every night  promethazine (PHENERGAN) 25 mg Oral Tablet, Take 1 Tablet (25 mg total) by mouth Every 6 hours as needed for Nausea/Vomiting  risperiDONE  (RISPERDAL ) 1 mg Oral Tablet, Take 1 Tablet (1 mg total) by mouth Twice  daily  vibegron (GEMTESA) 75 mg Oral Tablet, Take 1 Tablet (75 mg total) by mouth Once a day    No facility-administered medications prior to visit.      Allergies:   Allergies   Allergen Reactions    Invega [Paliperidone] Hives/ Urticaria     Patient also noted irregular heart rate, bloodshot eyes, and feeling unwell.       Objective:  General appearance: WNWD female in NAD  HEENT: Normocephalic, atraumatic, PERR, EOM intact, anicteric, MMM, dentition in good repair, no apparent thyromegaly  Skin: Warm, dry, intact, no rash or abrasion  Neuro: A&O x4 to person, place, date, and situation    Mental Status Examination:  Appearance: Appropriate grooming and  hygiene, in seasonally-appropriate casual attire, appears about stated age, normal gait.  Behavior: Calm, pleasant, and cooperative. Appropriate eye-contact.  Speech: Easily able to interpret. Regular in rate, rhythm, tone, and volume.  Mood: "Good"  Affect: Mood congruent, euthymic, full-range  Thought Process: Logical, linear, and goal-directed. No blocking, flight of ideas, or loose associations.  Thought Content: Without suicidal or homicidal ideations  Perceptions: Denies A/V hallucinations. No evidence of paranoid thinking or delusional thought processes. Not responding to internal stimuli.  Memory: Immediate recall, short term, and remote past memory grossly intact.  Language: appropriate recognition and naming. No perseveration or delay in response.  Fund of knowledge: Intact. Intelligence estimated to be about average.  Attention span and concentration: Sustained/Intact  Insight: Good  Judgment: Good    Labs:    Assessment:  This patient is a 33 yo female seen for follow-up medication appointment. Pt reports that anxiety has improved since initiating klonopin . Medications were discussed in detail including risks, benefits, and alternatives. The diagnosis is as follows:    ICD-10-CM    1. GAD (generalized anxiety disorder)  F41.1       2. Asperger syndrome  F84.5       3. History of psychosis  Z86.59       4. Tic disorder  F95.9             Plan:  1. Return to clinic in 8 weeks.   -May return sooner if needed or contact via phone or [facility].    2. Pharmacological intervention as follows:  -Continue Risperdal  1 mg BID. Patient wishes to have her endocrinologist, cardiologist and PCP monitor her lipid profile and blood glucose levels.   -Continue Pamelor  to 25 mg qhs.  -Continue Klonopin  to 1 mg BID.  -Increase hydroxyzine  to 100 mg up to BID prn anxiety/insomnia.     3. Labs:     4. Psychotherapy discussed     5. Supportive therapy provided and non-pharmacological treatment modalities reviewed      All  current medications, including OTC meds, have been reviewed, discussed, and adjusted as clinically appropriate with the patient. The patient expressed an understanding of and agreement with the plan outlined above.      Patient advised to call, return to the clinic, or contact provider via [facility] to report side effects or worsening of symptoms. Also reviewed emergency contacts including after-hours MARS line, [facility] line, ED, and 911 for emergencies.  Patient was informed of the 24 hour per day, 7 days a week psychiatric coverage in the emergency department.    Lonnell Rob, PA-C

## 2024-02-14 ENCOUNTER — Other Ambulatory Visit (HOSPITAL_COMMUNITY): Payer: Self-pay | Admitting: NURSE PRACTITIONER

## 2024-02-14 DIAGNOSIS — E041 Nontoxic single thyroid nodule: Secondary | ICD-10-CM

## 2024-02-17 ENCOUNTER — Other Ambulatory Visit (HOSPITAL_COMMUNITY): Payer: Self-pay

## 2024-02-17 DIAGNOSIS — E1165 Type 2 diabetes mellitus with hyperglycemia: Secondary | ICD-10-CM

## 2024-02-19 ENCOUNTER — Other Ambulatory Visit (HOSPITAL_COMMUNITY): Payer: Self-pay

## 2024-02-19 DIAGNOSIS — E1165 Type 2 diabetes mellitus with hyperglycemia: Secondary | ICD-10-CM

## 2024-03-13 ENCOUNTER — Inpatient Hospital Stay (HOSPITAL_COMMUNITY)
Admission: RE | Admit: 2024-03-13 | Discharge: 2024-03-13 | Disposition: A | Discharge status: Home / Self Care | Payer: Self-pay | Source: Ambulatory Visit

## 2024-03-13 ENCOUNTER — Ambulatory Visit
Admission: RE | Admit: 2024-03-13 | Discharge: 2024-03-13 | Disposition: A | Discharge status: Home / Self Care | Source: Ambulatory Visit | Attending: NURSE PRACTITIONER | Admitting: NURSE PRACTITIONER

## 2024-03-13 ENCOUNTER — Other Ambulatory Visit: Payer: Self-pay

## 2024-03-13 DIAGNOSIS — E041 Nontoxic single thyroid nodule: Secondary | ICD-10-CM | POA: Insufficient documentation

## 2024-03-13 DIAGNOSIS — E1165 Type 2 diabetes mellitus with hyperglycemia: Secondary | ICD-10-CM | POA: Insufficient documentation

## 2024-03-13 MED ORDER — GADOTERIDOL 279.3 MG/ML INTRAVENOUS SOLUTION
20.0000 mL | INTRAVENOUS | Status: AC
Start: 2024-03-13 — End: 2024-03-13
  Administered 2024-03-13: 20 mL via INTRAVENOUS

## 2024-04-16 ENCOUNTER — Ambulatory Visit: Payer: Self-pay | Admitting: Surgery

## 2024-04-16 ENCOUNTER — Telehealth: Payer: MEDICAID | Admitting: PHYSICIAN ASSISTANT

## 2024-04-16 ENCOUNTER — Other Ambulatory Visit: Payer: Self-pay

## 2024-04-16 DIAGNOSIS — F411 Generalized anxiety disorder: Secondary | ICD-10-CM

## 2024-04-16 DIAGNOSIS — F959 Tic disorder, unspecified: Secondary | ICD-10-CM

## 2024-04-16 DIAGNOSIS — Z8659 Personal history of other mental and behavioral disorders: Secondary | ICD-10-CM

## 2024-04-16 DIAGNOSIS — F845 Asperger's syndrome: Secondary | ICD-10-CM

## 2024-04-16 MED ORDER — CLONAZEPAM 1 MG TABLET
1.0000 mg | ORAL_TABLET | Freq: Three times a day (TID) | ORAL | 5 refills | Status: AC
Start: 2024-04-16 — End: ?

## 2024-04-16 NOTE — Progress Notes (Signed)
 Psychiatry Telemedicine Outpatient Follow-up  TELEMEDICINE DOCUMENTATION:    Patient Location:  MyChart video visit from home address: [address]  [place] [address]    Patient/family aware of provider location:  yes  Patient/family consent for telemedicine:  yes  Examination observed and performed by:  Lonnell Rob, PA-C   Date of service: 04/16/2024  Patient Name: Kristy Santiago  MRN: [id number]      CC: Medication follow-up for GAD, Aspberger Syndrome, tic disorder, history of psychosis    Subjective: Pt is a 33 yo female who was last seen for med check appointment on 02/13/2024. At that time, her medication regimen was adjusted as follows:    -Continue Risperdal  1 mg BID. Patient wishes to have her endocrinologist, cardiologist and PCP monitor her lipid profile and blood glucose levels.   -Continue Pamelor  to 25 mg qhs.  -Continue Klonopin  to 1 mg BID.  -Increase hydroxyzine  to 100 mg up to BID prn anxiety/insomnia.     The patient reports today that she is anxious about upcoming dental extractions. Besides the dental work, she has been doing "pretty good." She normally takes melatonin to help her to fall asleep, but has been able to chew the melatonin gummies due to her dental work. She has been taking 2 hydroxyzine  tablets to help her to fall asleep. She notes that she is more irritable.     Social history: The patient does not work, homeschools her daughter. She lives with her mom and stepdad.     ROS:  Psych: As per HPI    Current medications: aspirin (ECOTRIN) 81 mg Oral Tablet, Delayed Release (E.C.), Take 1 Tablet (81 mg total) by mouth Once a day  atorvastatin (LIPITOR) 80 mg Oral Tablet, Take 1 Tablet (80 mg total) by mouth Every evening  Baclofen (LIORESAL) 20 mg Oral Tablet, Take 1 Tablet (20 mg total) by mouth Three times a day  carvediloL (COREG) 25 mg Oral Tablet, Take 1 Tablet (25 mg total) by mouth Twice daily with food  clonazePAM  (KLONOPIN ) 1 mg Oral Tablet, Take 1 Tablet (1 mg total) by  mouth Twice daily  clopidogreL (PLAVIX) 75 mg Oral Tablet, Take 1 Tablet (75 mg total) by mouth Once a day  ezetimibe (ZETIA) 10 mg Oral Tablet, Take 1 Tablet (10 mg total) by mouth Every evening  fenofibrate micronized (LOFIBRA) 200 mg Oral Capsule, Take 1 Capsule (200 mg total) by mouth Once a day  hydrOXYzine  pamoate (VISTARIL ) 100 mg Oral Capsule, Take 1-2 tablets daily as needed for anxiety or insomnia.  icosapent ethyL (VASCEPA) 1 gram Oral Capsule, Take 2 Capsules (2 g total) by mouth Twice daily  insulin lispro (HUMALOG) 100 units/mL Subcutaneous Injectable, Inject 0-12 Units under the skin  isosorbide mononitrate (IMDUR) 30 mg Oral Tablet Sustained Release 24 hr, Take 1 Tablet (30 mg total) by mouth Every morning  losartan potassium, bulk, 100 % Does not apply Powder,   MetFORMIN (GLUCOPHAGE) 1,000 mg Oral Tablet, Take 1 Tablet (1,000 mg total) by mouth Twice daily with food  nitroGLYCERIN (NITROSTAT) 0.4 mg Sublingual Tablet, Sublingual, Place 1 Tablet (0.4 mg total) under the tongue Every 5 minutes as needed for Chest pain for 3 doses over 15 minutes  nortriptyline  (PAMELOR ) 25 mg Oral Capsule, Take 1 Capsule (25 mg total) by mouth Every night  promethazine (PHENERGAN) 25 mg Oral Tablet, Take 1 Tablet (25 mg total) by mouth Every 6 hours as needed for Nausea/Vomiting  risperiDONE  (RISPERDAL ) 1 mg Oral Tablet, Take 1 Tablet (  1 mg total) by mouth Twice daily  vibegron (GEMTESA) 75 mg Oral Tablet, Take 1 Tablet (75 mg total) by mouth Once a day    No facility-administered medications prior to visit.      Allergies:   Allergies   Allergen Reactions    Invega [Paliperidone] Hives/ Urticaria     Patient also noted irregular heart rate, bloodshot eyes, and feeling unwell.       Objective:  General appearance: WNWD female in NAD  HEENT: Normocephalic, atraumatic, PERR, EOM intact, anicteric, MMM, dentition in good repair, no apparent thyromegaly  Skin: Warm, dry, intact, no rash or abrasion  Neuro: A&O x4 to  person, place, date, and situation    Mental Status Examination:  Appearance: Fair grooming and hygiene, in seasonally-appropriate casual attire, appears about stated age, normal gait.  Behavior: Calm, pleasant, and cooperative. Appropriate eye-contact.  Speech: Easily able to interpret. Regular in rate, rhythm, tone, and volume.  Mood: "Stressed"  Affect: Mood congruent, euthymic, full-range  Thought Process: Logical, linear, and goal-directed. No blocking, flight of ideas, or loose associations.  Thought Content: Without suicidal or homicidal ideations  Perceptions: Denies A/V hallucinations. No evidence of paranoid thinking or delusional thought processes. Not responding to internal stimuli.  Memory: Immediate recall, short term, and remote past memory grossly intact.  Language: appropriate recognition and naming. No perseveration or delay in response.  Fund of knowledge: Intact. Intelligence estimated to be about average.  Attention span and concentration: Sustained/Intact  Insight: Fair  Judgment: Fair    Labs:    Assessment:  This patient is a 33 yo female seen for follow-up medication appointment. Pt reports increasing anxiety over dental work. Medications were discussed in detail including risks, benefits, and alternatives. The diagnosis is as follows:    ICD-10-CM    1. GAD (generalized anxiety disorder)  F41.1       2. Asperger syndrome  F84.5       3. Tic disorder  F95.9       4. History of psychosis  Z86.59             Plan:  1. Return to clinic in 4 weeks.   -May return sooner if needed or contact via phone or [facility].    2. Pharmacological intervention as follows:  -Continue Risperdal  1 mg BID. Patient wishes to have her endocrinologist, cardiologist and PCP monitor her lipid profile and blood glucose levels.   -Continue Pamelor  to 25 mg qhs.  -Increase Klonopin  to 1 mg TID.  -Continue hydroxyzine  to 100 mg up to TID prn anxiety/insomnia.     3. Labs:     4. Psychotherapy discussed     5. Supportive  therapy provided and non-pharmacological treatment modalities reviewed      All current medications, including OTC meds, have been reviewed, discussed, and adjusted as clinically appropriate with the patient. The patient expressed an understanding of and agreement with the plan outlined above.      Patient advised to call, return to the clinic, or contact provider via [facility] to report side effects or worsening of symptoms. Also reviewed emergency contacts including after-hours MARS line, [facility] line, ED, and 911 for emergencies.  Patient was informed of the 24 hour per day, 7 days a week psychiatric coverage in the emergency department.    Lonnell Rob, PA-C

## 2024-05-07 ENCOUNTER — Emergency Department (HOSPITAL_COMMUNITY)

## 2024-05-07 ENCOUNTER — Inpatient Hospital Stay
Admission: EM | Admit: 2024-05-07 | Discharge: 2024-05-12 | DRG: 638 | Disposition: A | Discharge status: Home / Self Care | Source: Ambulatory Visit | Attending: INTERNAL MEDICINE | Admitting: INTERNAL MEDICINE

## 2024-05-07 ENCOUNTER — Other Ambulatory Visit: Payer: Self-pay

## 2024-05-07 ENCOUNTER — Encounter (HOSPITAL_COMMUNITY): Payer: Self-pay

## 2024-05-07 ENCOUNTER — Inpatient Hospital Stay (HOSPITAL_COMMUNITY): Admitting: INTERNAL MEDICINE

## 2024-05-07 DIAGNOSIS — E1110 Type 2 diabetes mellitus with ketoacidosis without coma: Principal | ICD-10-CM | POA: Diagnosis present

## 2024-05-07 DIAGNOSIS — K859 Acute pancreatitis without necrosis or infection, unspecified: Secondary | ICD-10-CM | POA: Insufficient documentation

## 2024-05-07 DIAGNOSIS — E785 Hyperlipidemia, unspecified: Secondary | ICD-10-CM | POA: Diagnosis present

## 2024-05-07 DIAGNOSIS — Z955 Presence of coronary angioplasty implant and graft: Secondary | ICD-10-CM

## 2024-05-07 DIAGNOSIS — E876 Hypokalemia: Secondary | ICD-10-CM | POA: Diagnosis not present

## 2024-05-07 DIAGNOSIS — A0472 Enterocolitis due to Clostridium difficile, not specified as recurrent: Secondary | ICD-10-CM | POA: Diagnosis present

## 2024-05-07 DIAGNOSIS — I2510 Atherosclerotic heart disease of native coronary artery without angina pectoris: Secondary | ICD-10-CM | POA: Diagnosis present

## 2024-05-07 DIAGNOSIS — R112 Nausea with vomiting, unspecified: Secondary | ICD-10-CM | POA: Insufficient documentation

## 2024-05-07 DIAGNOSIS — Z7982 Long term (current) use of aspirin: Secondary | ICD-10-CM

## 2024-05-07 DIAGNOSIS — E669 Obesity, unspecified: Secondary | ICD-10-CM | POA: Diagnosis present

## 2024-05-07 DIAGNOSIS — Z794 Long term (current) use of insulin: Secondary | ICD-10-CM

## 2024-05-07 DIAGNOSIS — Z7902 Long term (current) use of antithrombotics/antiplatelets: Secondary | ICD-10-CM

## 2024-05-07 DIAGNOSIS — F17210 Nicotine dependence, cigarettes, uncomplicated: Secondary | ICD-10-CM | POA: Diagnosis present

## 2024-05-07 DIAGNOSIS — R7989 Other specified abnormal findings of blood chemistry: Secondary | ICD-10-CM | POA: Insufficient documentation

## 2024-05-07 DIAGNOSIS — Z6839 Body mass index (BMI) 39.0-39.9, adult: Secondary | ICD-10-CM

## 2024-05-07 DIAGNOSIS — I252 Old myocardial infarction: Secondary | ICD-10-CM

## 2024-05-07 DIAGNOSIS — I10 Essential (primary) hypertension: Secondary | ICD-10-CM | POA: Diagnosis present

## 2024-05-07 LAB — CBC WITH DIFF
HCT: 40.6 % (ref 34.8–46.0)
HGB: 13.9 g/dL (ref 11.5–16.0)
MCH: 26.1 pg (ref 26.0–32.0)
MCHC: 34.2 g/dL (ref 31.0–35.5)
MCV: 76.3 fL — ABNORMAL LOW (ref 78.0–100.0)
MPV: 9.8 fL (ref 8.7–12.5)
PLATELETS: 302 10*3/uL (ref 150–400)
RBC: 5.32 10*6/uL — ABNORMAL HIGH (ref 3.85–5.22)
RDW-CV: 13.6 % (ref 11.5–15.5)
WBC: 5.4 10*3/uL (ref 3.7–11.0)

## 2024-05-07 LAB — MANUAL DIFF AND MORPHOLOGY-SYSMEX
BASOPHIL #: <0.04 10*3/uL (ref <=0.20)
BASOPHIL %: 0 %
EOSINOPHIL #: 0.22 10*3/uL (ref <=0.50)
EOSINOPHIL %: 4 %
LYMPHOCYTE #: 2.11 10*3/uL (ref 1.00–4.80)
LYMPHOCYTE %: 39 %
MONOCYTE #: 0.05 10*3/uL — ABNORMAL LOW (ref 0.20–1.10)
MONOCYTE %: 1 %
NEUTROPHIL #: 3.02 10*3/uL (ref 1.50–7.70)
NEUTROPHIL %: 56 %
RBC MORPHOLOGY: NORMAL

## 2024-05-07 LAB — MAGNESIUM: MAGNESIUM: 2 mg/dL (ref 1.8–2.6)

## 2024-05-07 LAB — BASIC METABOLIC PANEL
ANION GAP: 15 mmol/L — ABNORMAL HIGH (ref 4–13)
ANION GAP: 11 mmol/L (ref 4–13)
BUN/CREA RATIO: 5 — ABNORMAL LOW (ref 6–22)
BUN/CREA RATIO: 5 — ABNORMAL LOW (ref 6–22)
BUN: 5 mg/dL — ABNORMAL LOW (ref 8–25)
BUN: 4 mg/dL — ABNORMAL LOW (ref 8–25)
CALCIUM: 9.5 mg/dL (ref 8.6–10.2)
CALCIUM: 8.8 mg/dL (ref 8.6–10.2)
CHLORIDE: 97 mmol/L (ref 96–111)
CHLORIDE: 104 mmol/L (ref 96–111)
CO2 TOTAL: 20 mmol/L — ABNORMAL LOW (ref 22–30)
CO2 TOTAL: 21 mmol/L — ABNORMAL LOW (ref 22–30)
CREATININE: 1.1 mg/dL — ABNORMAL HIGH (ref 0.60–1.05)
CREATININE: 0.8 mg/dL (ref 0.60–1.05)
ESTIMATED GFR - FEMALE: 69 mL/min/BSA (ref >=60)
ESTIMATED GFR - FEMALE: >90 mL/min/BSA (ref >=60)
GLUCOSE: 590 mg/dL (ref 65–125)
GLUCOSE: 277 mg/dL — ABNORMAL HIGH (ref 65–125)
POTASSIUM: 4 mmol/L (ref 3.5–5.1)
POTASSIUM: 3.9 mmol/L (ref 3.5–5.1)
SODIUM: 132 mmol/L — ABNORMAL LOW (ref 136–145)
SODIUM: 136 mmol/L (ref 136–145)

## 2024-05-07 LAB — HEPATIC FUNCTION PANEL
ALBUMIN: 3.6 g/dL (ref 3.5–5.0)
ALKALINE PHOSPHATASE: 174 U/L — ABNORMAL HIGH (ref 40–110)
ALT (SGPT): 73 U/L — ABNORMAL HIGH (ref <31)
AST (SGOT): 52 U/L — ABNORMAL HIGH (ref 11–34)
BILIRUBIN DIRECT: 0.2 mg/dL (ref 0.1–0.4)
BILIRUBIN TOTAL: 0.4 mg/dL (ref 0.3–1.3)
PROTEIN TOTAL: 7.6 g/dL (ref 6.4–8.3)

## 2024-05-07 LAB — BLUE TOP TUBE

## 2024-05-07 LAB — URINALYSIS, MACROSCOPIC
BILIRUBIN: NEGATIVE mg/dL
GLUCOSE: >=1000 mg/dL — AB
KETONES: NEGATIVE mg/dL
LEUKOCYTES: NEGATIVE WBCs/uL
NITRITE: NEGATIVE
PH: 6.5
PROTEIN: NEGATIVE mg/dL
SPECIFIC GRAVITY: <=1.005 (ref 1.005–1.030)

## 2024-05-07 LAB — TROPONIN-I: TROPONIN-I HS: 6.1 ng/L (ref <14.9)

## 2024-05-07 LAB — PHOSPHORUS: PHOSPHORUS: 1.8 mg/dL — ABNORMAL LOW (ref 2.4–4.7)

## 2024-05-07 LAB — GOLD TOP TUBE

## 2024-05-07 LAB — LIPASE: LIPASE: 116 U/L — ABNORMAL HIGH (ref 10–60)

## 2024-05-07 LAB — URINALYSIS, MICROSCOPIC

## 2024-05-07 LAB — LAVENDER TOP TUBE

## 2024-05-07 LAB — POC BLOOD GLUCOSE (RESULTS)
GLUCOSE, POC: 582 mg/dL (ref 70–99)
GLUCOSE, POC: 445 mg/dL (ref 70–99)
GLUCOSE, POC: 322 mg/dL — ABNORMAL HIGH (ref 70–99)
GLUCOSE, POC: 204 mg/dL — ABNORMAL HIGH (ref 70–99)
GLUCOSE, POC: 220 mg/dL — ABNORMAL HIGH (ref 70–99)

## 2024-05-07 LAB — HCG, URINE QUALITATIVE, PREGNANCY: HCG URINE QUALITATIVE: NEGATIVE

## 2024-05-07 LAB — LIGHT GREEN TOP TUBE

## 2024-05-07 MED ORDER — NICOTINE 14 MG/24 HR DAILY TRANSDERMAL PATCH
14.0000 mg | MEDICATED_PATCH | Freq: Once | TRANSDERMAL | Status: AC
Start: 2024-05-07 — End: 2024-05-08
  Administered 2024-05-07: 14 mg via TRANSDERMAL
  Filled 2024-05-07: qty 1

## 2024-05-07 MED ORDER — CLONAZEPAM 1 MG TABLET
1.0000 mg | ORAL_TABLET | Freq: Three times a day (TID) | ORAL | Status: DC
Start: 2024-05-07 — End: 2024-05-12
  Administered 2024-05-07 – 2024-05-12 (×14): 1 mg via ORAL
  Filled 2024-05-07 (×14): qty 1

## 2024-05-07 MED ORDER — ELECTROLYTE-R (PH 7.4) BOLUS
1000.0000 mL | Freq: Once | INTRAVENOUS | Status: AC
Start: 2024-05-07 — End: 2024-05-07
  Administered 2024-05-07: 1000 mL via INTRAVENOUS
  Administered 2024-05-07: 0 mL via INTRAVENOUS

## 2024-05-07 MED ORDER — POTASSIUM CHLORIDE ER 10 MEQ TABLET,EXTENDED RELEASE
ORAL_TABLET | ORAL | Status: DC
Start: 2024-05-07 — End: 2024-05-07
  Filled 2024-05-07: qty 1

## 2024-05-07 MED ORDER — CARVEDILOL 25 MG TABLET
25.0000 mg | ORAL_TABLET | Freq: Two times a day (BID) | ORAL | Status: DC
Start: 2024-05-08 — End: 2024-05-12
  Administered 2024-05-08 – 2024-05-12 (×9): 25 mg via ORAL
  Filled 2024-05-07 (×8): qty 1

## 2024-05-07 MED ORDER — ENOXAPARIN 60 MG/0.6 ML SUBCUTANEOUS SYRINGE
40.0000 mg | INJECTION | SUBCUTANEOUS | Status: AC
Start: 2024-05-07 — End: ?
  Administered 2024-05-07 – 2024-05-11 (×5): 40 mg via SUBCUTANEOUS
  Filled 2024-05-07 (×5): qty 0.6

## 2024-05-07 MED ORDER — RISPERIDONE 1 MG TABLET
1.0000 mg | ORAL_TABLET | Freq: Two times a day (BID) | ORAL | Status: DC
Start: 2024-05-07 — End: 2024-05-12
  Administered 2024-05-07 – 2024-05-12 (×10): 1 mg via ORAL
  Filled 2024-05-07 (×11): qty 1

## 2024-05-07 MED ORDER — BACLOFEN 10 MG TABLET
20.0000 mg | ORAL_TABLET | Freq: Three times a day (TID) | ORAL | Status: DC
Start: 2024-05-07 — End: 2024-05-12
  Administered 2024-05-07 – 2024-05-12 (×14): 20 mg via ORAL
  Filled 2024-05-07 (×13): qty 2

## 2024-05-07 MED ORDER — DEXTROSE 50 % IN WATER (D50W) INTRAVENOUS SYRINGE
12.5000 g | INJECTION | INTRAVENOUS | Status: DC | PRN
Start: 2024-05-07 — End: 2024-05-10

## 2024-05-07 MED ORDER — PROMETHAZINE 25 MG TABLET
25.0000 mg | ORAL_TABLET | Freq: Four times a day (QID) | ORAL | Status: DC | PRN
Start: 2024-05-07 — End: 2024-05-12
  Administered 2024-05-07: 25 mg via ORAL
  Filled 2024-05-07: qty 1

## 2024-05-07 MED ORDER — POTASSIUM CHLORIDE 20 MEQ/100ML IN STERILE WATER INTRAVENOUS PIGGYBACK
20.0000 meq | INJECTION | INTRAVENOUS | Status: AC
Start: 2024-05-07 — End: 2024-05-07
  Administered 2024-05-07 (×2): 0 meq via INTRAVENOUS
  Administered 2024-05-07: 20 meq via INTRAVENOUS
  Filled 2024-05-07: qty 100

## 2024-05-07 MED ORDER — ATORVASTATIN 40 MG TABLET
80.0000 mg | ORAL_TABLET | Freq: Every evening | ORAL | Status: DC
Start: 2024-05-07 — End: 2024-05-12
  Administered 2024-05-07 – 2024-05-11 (×5): 80 mg via ORAL
  Filled 2024-05-07 (×6): qty 2

## 2024-05-07 MED ORDER — PANTOPRAZOLE 40 MG INTRAVENOUS SOLUTION
40.0000 mg | Freq: Every day | INTRAVENOUS | Status: DC
Start: 2024-05-08 — End: 2024-05-09
  Administered 2024-05-08 – 2024-05-09 (×2): 40 mg via INTRAVENOUS
  Filled 2024-05-07: qty 10

## 2024-05-07 MED ORDER — IOPAMIDOL 370 MG IODINE/ML (76 %) INTRAVENOUS SOLUTION
50.0000 mL | INTRAVENOUS | Status: AC
Start: 2024-05-07 — End: 2024-05-07
  Administered 2024-05-07: 50 mL via INTRAVENOUS

## 2024-05-07 MED ORDER — INSULIN REGULAR 100 UNIT/100 ML (1 UNIT/ML) IN 0.9 % NACL IV SOLUTION -ADULT DKA
0.1000 [IU]/kg/h | INTRAVENOUS | Status: DC
Start: 2024-05-07 — End: 2024-05-10
  Administered 2024-05-07: 0.1 [IU]/kg/h via INTRAVENOUS
  Administered 2024-05-07: 0.049 [IU]/kg/h via INTRAVENOUS
  Administered 2024-05-08 (×2): 0.056 [IU]/kg/h via INTRAVENOUS
  Administered 2024-05-08: 0.065 [IU]/kg/h via INTRAVENOUS
  Administered 2024-05-08: 0 [IU]/kg/h via INTRAVENOUS
  Administered 2024-05-08 – 2024-05-09 (×2): 0.056 [IU]/kg/h via INTRAVENOUS
  Administered 2024-05-09: 0.065 [IU]/kg/h via INTRAVENOUS
  Administered 2024-05-09: 0.056 [IU]/kg/h via INTRAVENOUS
  Administered 2024-05-09 (×2): 0.075 [IU]/kg/h via INTRAVENOUS
  Administered 2024-05-09: 0.047 [IU]/kg/h via INTRAVENOUS
  Administered 2024-05-09: 0.084 [IU]/kg/h via INTRAVENOUS
  Administered 2024-05-10 (×2): 0.056 [IU]/kg/h via INTRAVENOUS
  Administered 2024-05-10: 0.047 [IU]/kg/h via INTRAVENOUS
  Administered 2024-05-10: 0 [IU]/kg/h via INTRAVENOUS
  Administered 2024-05-10: 0.037 [IU]/kg/h via INTRAVENOUS
  Administered 2024-05-10: 0.047 [IU]/kg/h via INTRAVENOUS
  Administered 2024-05-10: 0.065 [IU]/kg/h via INTRAVENOUS
  Administered 2024-05-10 (×2): 0.056 [IU]/kg/h via INTRAVENOUS
  Filled 2024-05-07 (×10): qty 100

## 2024-05-07 MED ORDER — POTASSIUM CHLORIDE 20 MEQ/L IN DEXTROSE 5 %-0.45 % SODIUM CHLORIDE IV
INTRAVENOUS | Status: DC
Start: 2024-05-07 — End: 2024-05-08
  Administered 2024-05-08: 0 mL via INTRAVENOUS
  Filled 2024-05-07: qty 1000

## 2024-05-07 MED ORDER — CLOPIDOGREL 75 MG TABLET
75.0000 mg | ORAL_TABLET | Freq: Every day | ORAL | Status: DC
Start: 2024-05-08 — End: 2024-05-12
  Administered 2024-05-08 – 2024-05-12 (×5): 75 mg via ORAL
  Filled 2024-05-07 (×5): qty 1

## 2024-05-07 MED ORDER — ASPIRIN 81 MG TABLET,DELAYED RELEASE
81.0000 mg | DELAYED_RELEASE_TABLET | Freq: Every day | ORAL | Status: DC
Start: 2024-05-08 — End: 2024-05-12
  Administered 2024-05-08 – 2024-05-12 (×5): 81 mg via ORAL
  Filled 2024-05-07 (×5): qty 1

## 2024-05-07 MED ORDER — INSULIN GLARGINE-YFGN (U-100) 100 UNIT/ML (3 ML) SUBCUTANEOUS PEN
10.0000 [IU] | PEN_INJECTOR | Freq: Once | SUBCUTANEOUS | Status: AC
Start: 2024-05-07 — End: 2024-05-07
  Administered 2024-05-07: 10 [IU] via SUBCUTANEOUS
  Filled 2024-05-07: qty 3

## 2024-05-07 MED ORDER — INSULIN GLARGINE-YFGN (U-100) 100 UNIT/ML (3 ML) SUBCUTANEOUS PEN
10.0000 [IU] | PEN_INJECTOR | Freq: Once | SUBCUTANEOUS | Status: DC
Start: 2024-05-07 — End: 2024-05-10
  Administered 2024-05-07: 0 [IU] via SUBCUTANEOUS

## 2024-05-07 MED ORDER — ELECTROLYTE-R (PH 7.4) INTRAVENOUS SOLUTION
INTRAVENOUS | Status: DC
Start: 2024-05-07 — End: 2024-05-07

## 2024-05-07 MED ORDER — SODIUM CHLORIDE 0.9 % (FLUSH) INJECTION SYRINGE
10.0000 mL | INJECTION | Freq: Three times a day (TID) | INTRAMUSCULAR | Status: DC
Start: 2024-05-07 — End: 2024-05-12
  Administered 2024-05-07 – 2024-05-08 (×3): 10 mL
  Administered 2024-05-08: 0 mL
  Administered 2024-05-09 (×2): 10 mL
  Administered 2024-05-09: 0 mL
  Administered 2024-05-10 – 2024-05-12 (×7): 10 mL

## 2024-05-07 MED ORDER — SODIUM CHLORIDE 0.9 % INJECTION SOLUTION
10.0000 mL | Freq: Every day | INTRAMUSCULAR | Status: DC
Start: 2024-05-08 — End: 2024-05-09
  Administered 2024-05-08 – 2024-05-09 (×2): 10 mL via INTRAVENOUS
  Filled 2024-05-07: qty 10

## 2024-05-07 MED ORDER — DKA-RN TO REPLACE POTASSIUM PROTOCOL PLACEHOLDER
Status: AC | PRN
Start: 2024-05-07 — End: 2024-05-08

## 2024-05-07 MED ORDER — HYDROXYZINE PAMOATE 25 MG CAPSULE
100.0000 mg | ORAL_CAPSULE | Freq: Every evening | ORAL | Status: DC
Start: 2024-05-07 — End: 2024-05-12
  Administered 2024-05-07 – 2024-05-11 (×5): 100 mg via ORAL
  Filled 2024-05-07 (×5): qty 4

## 2024-05-07 MED ORDER — DEXTROSE 10 % IN WATER (D10W) INTRAVENOUS SOLUTION
INTRAVENOUS | Status: DC
Start: 2024-05-07 — End: 2024-05-07

## 2024-05-07 MED ORDER — SODIUM CHLORIDE 0.9 % (FLUSH) INJECTION SYRINGE
10.0000 mL | INJECTION | INTRAMUSCULAR | Status: DC | PRN
Start: 2024-05-07 — End: 2024-05-12

## 2024-05-07 MED ORDER — FENOFIBRATE NANOCRYSTALLIZED 145 MG TABLET
145.0000 mg | ORAL_TABLET | Freq: Every morning | ORAL | Status: DC
Start: 2024-05-08 — End: 2024-05-12
  Administered 2024-05-08 – 2024-05-12 (×5): 145 mg via ORAL
  Filled 2024-05-07 (×5): qty 1

## 2024-05-07 MED ORDER — POTASSIUM CHLORIDE ER 10 MEQ TABLET,EXTENDED RELEASE
40.0000 meq | ORAL_TABLET | Freq: Once | ORAL | Status: AC
Start: 2024-05-07 — End: 2024-05-07
  Administered 2024-05-07: 40 meq via ORAL
  Filled 2024-05-07: qty 4

## 2024-05-07 MED ORDER — EZETIMIBE 10 MG TABLET
10.0000 mg | ORAL_TABLET | Freq: Every evening | ORAL | Status: DC
Start: 2024-05-07 — End: 2024-05-12
  Administered 2024-05-07 – 2024-05-11 (×5): 10 mg via ORAL
  Filled 2024-05-07 (×7): qty 1

## 2024-05-07 MED ORDER — SODIUM CHLORIDE 0.9 % IV BOLUS
1000.0000 mL | INJECTION | Status: AC
Start: 2024-05-07 — End: 2024-05-07
  Administered 2024-05-07: 0 mL via INTRAVENOUS
  Administered 2024-05-07: 1000 mL via INTRAVENOUS

## 2024-05-07 MED ORDER — CHLORHEXIDINE GLUCONATE 0.12 % MOUTHWASH
15.0000 mL | MOUTHWASH | Freq: Two times a day (BID) | Status: DC
Start: 2024-05-07 — End: 2024-05-12
  Administered 2024-05-07: 0 mL via ORAL
  Administered 2024-05-08 (×2): 15 mL via ORAL
  Administered 2024-05-09: 0 mL via ORAL
  Administered 2024-05-09: 15 mL via ORAL
  Administered 2024-05-10: 0 mL via ORAL
  Administered 2024-05-10 – 2024-05-12 (×4): 15 mL via ORAL
  Filled 2024-05-07 (×10): qty 15

## 2024-05-07 MED ORDER — ACETAMINOPHEN 1,000 MG/100 ML (10 MG/ML) INTRAVENOUS SOLUTION
1000.0000 mg | Freq: Four times a day (QID) | INTRAVENOUS | Status: AC | PRN
Start: 2024-05-07 — End: 2024-05-08
  Administered 2024-05-08: 1000 mg via INTRAVENOUS
  Administered 2024-05-08: 0 mg via INTRAVENOUS
  Filled 2024-05-07: qty 100

## 2024-05-07 MED ORDER — INSULIN REGULAR 100 UNIT/100 ML (1 UNIT/ML) IN 0.9 % NACL IV SOLUTION -ADULT DKA
0.0500 [IU]/kg/h | INTRAVENOUS | Status: DC
Start: 2024-05-07 — End: 2024-05-07
  Administered 2024-05-07: 0.05 [IU]/kg/h via INTRAVENOUS
  Administered 2024-05-07: 0 [IU]/kg/h via INTRAVENOUS
  Filled 2024-05-07: qty 100

## 2024-05-07 MED ORDER — ONDANSETRON HCL (PF) 4 MG/2 ML INJECTION SOLUTION
4.0000 mg | INTRAMUSCULAR | Status: DC | PRN
Start: 2024-05-07 — End: 2024-05-07

## 2024-05-07 MED ORDER — ISOSORBIDE MONONITRATE ER 30 MG TABLET,EXTENDED RELEASE 24 HR
30.0000 mg | ORAL_TABLET | Freq: Every morning | ORAL | Status: DC
Start: 2024-05-08 — End: 2024-05-12
  Administered 2024-05-08 – 2024-05-12 (×5): 30 mg via ORAL
  Filled 2024-05-07 (×5): qty 1

## 2024-05-07 MED ORDER — NITROGLYCERIN 0.4 MG SUBLINGUAL TABLET
0.4000 mg | SUBLINGUAL_TABLET | SUBLINGUAL | Status: DC | PRN
Start: 2024-05-07 — End: 2024-05-12

## 2024-05-07 MED ORDER — SODIUM CHLORIDE 0.9% FLUSH BAG - 250 ML
INTRAVENOUS | Status: DC | PRN
Start: 2024-05-07 — End: 2024-05-12

## 2024-05-07 MED ORDER — NORTRIPTYLINE 25 MG CAPSULE
25.0000 mg | ORAL_CAPSULE | Freq: Every evening | ORAL | Status: DC
Start: 2024-05-07 — End: 2024-05-12
  Administered 2024-05-07 – 2024-05-11 (×5): 25 mg via ORAL
  Filled 2024-05-07 (×6): qty 1

## 2024-05-07 MED ORDER — ALUMINUM-MAG HYDROXIDE-SIMETHICONE 200 MG-200 MG-20 MG/5 ML ORAL SUSP
30.0000 mL | ORAL | Status: DC | PRN
Start: 2024-05-07 — End: 2024-05-12
  Administered 2024-05-07: 30 mL via ORAL
  Filled 2024-05-07: qty 30

## 2024-05-07 MED ORDER — ONDANSETRON HCL (PF) 4 MG/2 ML INJECTION SOLUTION
4.0000 mg | INTRAMUSCULAR | Status: DC
Start: 2024-05-07 — End: 2024-05-07
  Administered 2024-05-07: 0 mg via INTRAVENOUS
  Filled 2024-05-07: qty 2

## 2024-05-07 MED ORDER — DEXTROSE 5 % AND 0.45 % SODIUM CHLORIDE INTRAVENOUS SOLUTION
INTRAVENOUS | Status: DC
Start: 2024-05-07 — End: 2024-05-07

## 2024-05-07 MED ORDER — DEXTROSE 50 % IN WATER (D50W) INTRAVENOUS SYRINGE
12.5000 g | INJECTION | INTRAVENOUS | Status: DC | PRN
Start: 2024-05-07 — End: 2024-05-07

## 2024-05-07 NOTE — ED Triage Notes (Signed)
 Pt states she went to PCP yesterday 05-06-24 complaining of not feeling well starting about the 3rd of the month, they ordered labs. Called her this morning and told her to go to the ER based on lab results.

## 2024-05-07 NOTE — Care Plan (Signed)
 Problem: Adult Inpatient Plan of Care  Goal: Plan of Care Review  Outcome: Ongoing (see interventions/notes)  Goal: Patient-Specific Goal (Individualized)  Outcome: Ongoing (see interventions/notes)  Goal: Absence of Hospital-Acquired Illness or Injury  Outcome: Ongoing (see interventions/notes)  Goal: Optimal Comfort and Wellbeing  Outcome: Ongoing (see interventions/notes)  Goal: Rounds/Family Conference  Outcome: Ongoing (see interventions/notes)     Problem: Diabetes  Goal: Optimal Coping  Outcome: Ongoing (see interventions/notes)  Goal: Optimal Functional Ability  Outcome: Ongoing (see interventions/notes)  Goal: Blood Glucose Level Within Target Range  Outcome: Ongoing (see interventions/notes)  Goal: Minimize Hypoglycemia Risk  Outcome: Ongoing (see interventions/notes)

## 2024-05-07 NOTE — H&P (Signed)
 [facility]  General Medicine  Admission H&P    Date of Service:  05/07/2024  Kristy Santiago, Kristy Santiago, 33 y.o. female  Encounter Start Date:  05/07/2024  Inpatient Admission Date: 05/07/2024  Date of Birth:  29-Apr-1991  PCP: Silvana Drones, FNP          Information Obtained from:     DNR Status:  FULL CODE: ATTEMPT RESUSCITATION/CPR    Chief Complaint:  Patient    HPI: Kristy Santiago is a 33 y.o. female with past medical history of type 2 diabetes mellitus on insulin for 9 years and follows with Endocrinology, coronary artery disease status post MI and stent deployment last year at [facility], long QT syndrome, hypertension, hyperlipidemia, obesity.  Reports nausea for at least 1-2 weeks which was worsening each day.  Patient was unable to eat and began cutting back on her insulin.  Over the past 2 days she has had severe vomiting, worsening nausea, and abdominal discomfort.  She came to the emergency room where she was noted to be in diabetic ketoacidosis with a blood sugar greater than 500 and a CO2 of 20.  Patient was started on an insulin drip and admission was recommended by the ER physician.  Patient follows with [facility].  She is on numerous medications.  She developed pancreatitis to Victoza in the past.  Seems to be responding to the insulin drip and she will be admitted to the intensive care unit for further medical manage    PAST MEDICAL:    Past Medical History:   Diagnosis Date    Colon polyp     Diabetes mellitus     Esophageal reflux     Essential hypertension     Generalized anxiety disorder     NSTEMI (non-ST elevated myocardial infarction) (CMS HCC)         Past Surgical History:   Procedure Laterality Date    CESAREAN SECTION      COLONOSCOPY      ESOPHAGOGASTRODUODENOSCOPY      HX CORONARY STENT PLACEMENT  08/26/2022    HX EAR TUBES              Medications Prior to Admission       Prescriptions    aspirin (ECOTRIN) 81 mg Oral Tablet, Delayed Release (E.C.)    Take 1 Tablet (81 mg total) by mouth  Once a day    atorvastatin (LIPITOR) 80 mg Oral Tablet    Take 1 Tablet (80 mg total) by mouth Every evening    Baclofen (LIORESAL) 20 mg Oral Tablet    Take 1 Tablet (20 mg total) by mouth Three times a day    carvediloL (COREG) 25 mg Oral Tablet    Take 1 Tablet (25 mg total) by mouth Twice daily with food    chlorhexidine gluconate (PERIDEX) 0.12 % Mucous Membrane Mouthwash    Swish and spit 15 mL Twice daily    clonazePAM  (KLONOPIN ) 1 mg Oral Tablet    Take 1 Tablet (1 mg total) by mouth Three times a day    clopidogreL (PLAVIX) 75 mg Oral Tablet    Take 1 Tablet (75 mg total) by mouth Once a day    ezetimibe (ZETIA) 10 mg Oral Tablet    Take 1 Tablet (10 mg total) by mouth Every evening    fenofibrate micronized (LOFIBRA) 200 mg Oral Capsule    Take 1 Capsule (200 mg total) by mouth Once a day    hydrOXYzine  pamoate (VISTARIL ) 100  mg Oral Capsule    Take 1-2 tablets daily as needed for anxiety or insomnia.    isosorbide mononitrate (IMDUR) 30 mg Oral Tablet Sustained Release 24 hr    Take 1 Tablet (30 mg total) by mouth Every morning    nitroGLYCERIN (NITROSTAT) 0.4 mg Sublingual Tablet, Sublingual    Place 1 Tablet (0.4 mg total) under the tongue Every 5 minutes as needed for Chest pain for 3 doses over 15 minutes    nortriptyline  (PAMELOR ) 25 mg Oral Capsule    Take 1 Capsule (25 mg total) by mouth Every night    risperiDONE  (RISPERDAL ) 1 mg Oral Tablet    Take 1 Tablet (1 mg total) by mouth Twice daily          Allergies[1]    Family History:  Family Medical History:       Problem Relation (Age of Onset)    Cancer Mother    Diabetes Mother    Heart Attack Father    Heart Disease Father    Multiple Sclerosis Mother    SLE Mother             Social History:  Social History[2]        ROS:   Gen:  denies fever, chills, weight loss or falls  HENT:  denies sore throat, head trauma or headache   CV:  denies chest pain, palpitations  Resp:  denies cough, shortness of breath or DOE  GI:  denies nausea, vomiting,  diarrhea, melena or hematemesis   Neuro:  denies weakness, slurred speech   Psych:  denies anxiety, depression, SI or HI       Vital Signs:  Temp (24hrs) Max:37.2 C (98.9 F)      Systolic (24hrs), Avg:156 , Min:148 , Max:162     Diastolic (24hrs), Avg:96, Min:91, Max:105    Temp  Avg: 36.7 C (98.1 F)  Min: 36.2 C (97.2 F)  Max: 37.2 C (98.9 F)  MAP (Non-Invasive)  Avg: 110.8 mmHG  Min: 110 mmHG  Max: 112 mmHG  Pulse  Avg: 105.8  Min: 101  Max: 112  Resp  Avg: 16  Min: 14  Max: 18  SpO2  Avg: 95.3 %  Min: 93 %  Max: 97 %     Fi02    Examination:  Gen:  chronically ill 33 y.o. White female appears chronically ill but in no apparent distress  HENT:  mmm  CV:  regular rate and rhythm   Resp:  clear, no wheezing or rhonchi   Abd:  soft, non tender, bowel sounds present  Ext:  no edema, distal pulses in tact  Neuro:  non focal, alert and oriented, moves all 4   Pscyh:  normal mood and affect       I/O:  I/O last 24 hours:    Intake/Output Summary (Last 24 hours) at 05/07/2024 2054  Last data filed at 05/07/2024 1837  Gross per 24 hour   Intake 2099 ml   Output --   Net 2099 ml     I/O current shift:  No intake/output data recorded.  Blood Sugars:       Labs:    I have reviewed all lab results.  Lab Results Today:    Results for orders placed or performed during the hospital encounter of 05/07/24 (from the past 24 hours)   POC BLOOD GLUCOSE (RESULTS)   Result Value Ref Range    GLUCOSE, POC 582 (HH) 70 - 99 mg/dl  BASIC METABOLIC PANEL   Result Value Ref Range    SODIUM 132 (L) 136 - 145 mmol/L    POTASSIUM 4.0 3.5 - 5.1 mmol/L    CHLORIDE 97 96 - 111 mmol/L    CO2 TOTAL 20 (L) 22 - 30 mmol/L    ANION GAP 15 (H) 4 - 13 mmol/L    CALCIUM 9.5 8.6 - 10.2 mg/dL    GLUCOSE 784 (HH) 65 - 125 mg/dL    BUN 5 (L) 8 - 25 mg/dL    CREATININE 6.96 (H) 0.60 - 1.05 mg/dL    BUN/CREA RATIO 5 (L) 6 - 22    ESTIMATED GFR - FEMALE 69 >=60 mL/min/BSA   HCG, URINE QUALITATIVE, PREGNANCY   Result Value Ref Range    HCG URINE QUALITATIVE  Negative Negative   CBC WITH DIFF   Result Value Ref Range    WBC 5.4 3.7 - 11.0 x10^3/uL    RBC 5.32 (H) 3.85 - 5.22 x10^6/uL    HGB 13.9 11.5 - 16.0 g/dL    HCT 29.5 28.4 - 13.2 %    MCV 76.3 (L) 78.0 - 100.0 fL    MCH 26.1 26.0 - 32.0 pg    MCHC 34.2 31.0 - 35.5 g/dL    RDW-CV 44.0 10.2 - 72.5 %    PLATELETS 302 150 - 400 x10^3/uL    MPV 9.8 8.7 - 12.5 fL   URINALYSIS, MACROSCOPIC   Result Value Ref Range    COLOR Yellow Yellow    APPEARANCE Clear Clear    GLUCOSE >=1000 (A) Negative mg/dL    BILIRUBIN Negative Negative mg/dL    KETONES Negative Negative mg/dL    SPECIFIC GRAVITY <=3.664 1.005 - 1.030    BLOOD Large (A) Negative mg/dL    PH 6.5     PROTEIN Negative Negative, Trace mg/dL    NITRITE Negative Negative    LEUKOCYTES Negative Negative WBCs/uL   URINALYSIS, MICROSCOPIC   Result Value Ref Range    RBCS TNTC (A) None seen, Rare, 0-2, 2-5 /hpf    WBCS 0-1 None, 1-5, Rare, 0-1 /hpf    BACTERIA Few Negative, Few, Rare /hpf    SQUAMOUS EPITHELIAL CELLS Few (A) None seen, Rare /lpf   MANUAL DIFF AND MORPHOLOGY-SYSMEX   Result Value Ref Range    NEUTROPHIL % 56 %    LYMPHOCYTE %  39 %    MONOCYTE % 1 %    EOSINOPHIL % 4 %    BASOPHIL % 0 %    NEUTROPHIL # 3.02 1.50 - 7.70 x10^3/uL    LYMPHOCYTE # 2.11 1.00 - 4.80 x10^3/uL    MONOCYTE # 0.05 (L) 0.20 - 1.10 x10^3/uL    EOSINOPHIL # 0.22 <=0.50 x10^3/uL    BASOPHIL # <0.04 <=0.20 x10^3/uL    RBC MORPHOLOGY Normal RBC and PLT Morphology    BLUE TOP TUBE   Result Value Ref Range    RAINBOW/EXTRA TUBE AUTO RESULT Yes    GOLD TOP TUBE   Result Value Ref Range    RAINBOW/EXTRA TUBE AUTO RESULT Yes    LIGHT GREEN TOP TUBE   Result Value Ref Range    RAINBOW/EXTRA TUBE AUTO RESULT Yes    LAVENDER TOP TUBE   Result Value Ref Range    RAINBOW/EXTRA TUBE AUTO RESULT Yes    LIPASE   Result Value Ref Range    LIPASE 116 (H) 10 - 60 U/L   HEPATIC FUNCTION PANEL   Result Value Ref Range  ALBUMIN 3.6 3.5 - 5.0 g/dL     ALKALINE PHOSPHATASE 174 (H) 40 - 110 U/L    ALT (SGPT)  73 (H) <31 U/L    AST (SGOT)  52 (H) 11 - 34 U/L    BILIRUBIN TOTAL 0.4 0.3 - 1.3 mg/dL    BILIRUBIN DIRECT 0.2 0.1 - 0.4 mg/dL    PROTEIN TOTAL 7.6 6.4 - 8.3 g/dL   POC BLOOD GLUCOSE (RESULTS)   Result Value Ref Range    GLUCOSE, POC 445 (HH) 70 - 99 mg/dl   POC BLOOD GLUCOSE (RESULTS)   Result Value Ref Range    GLUCOSE, POC 322 (H) 70 - 99 mg/dl   BASIC METABOLIC PANEL Q4 HOURS X 24 HOURS - TO START 4 HOURS AFTER INITIATION OF INFUSION   Result Value Ref Range    SODIUM 136 136 - 145 mmol/L    POTASSIUM 3.9 3.5 - 5.1 mmol/L    CHLORIDE 104 96 - 111 mmol/L    CO2 TOTAL 21 (L) 22 - 30 mmol/L    ANION GAP 11 4 - 13 mmol/L    CALCIUM 8.8 8.6 - 10.2 mg/dL    GLUCOSE 811 (H) 65 - 125 mg/dL    BUN 4 (L) 8 - 25 mg/dL    CREATININE 9.14 7.82 - 1.05 mg/dL    BUN/CREA RATIO 5 (L) 6 - 22    ESTIMATED GFR - FEMALE >90 >=60 mL/min/BSA   MAGNESIUM - DAILY X3 (DAY 1)   Result Value Ref Range    MAGNESIUM 2.0 1.8 - 2.6 mg/dL   PHOSPHORUS - DAILY X3 (DAY 1)   Result Value Ref Range    PHOSPHORUS 1.8 (L) 2.4 - 4.7 mg/dL   TROPONIN-I   Result Value Ref Range    TROPONIN-I HS 6.1 <14.9 ng/L       Imaging Studies:    Results for orders placed or performed during the hospital encounter of 05/07/24 (from the past 2160 hours)   CT ABDOMEN PELVIS W IV CONTRAST     Status: None    Narrative    Bradley Bur    CLINICAL HISTORY: abd pain, V, hyperglycemia    COMPARISON: No relevant comparison.    FINDINGS:  CT examination of the abdomen and pelvis was performed following intravenous contrast enhancement.    Normal appearing liver is noted.  Normal spleen is noted.  Normal gallbladder is noted. The remainder of the biliary tree is normal.   Normal pancreas is noted.    The kidneys  adrenal glands are unremarkable. The ureters and bladder are normal.  No evidence of mass or lymphadenopathy is noted.       Normal visceral arteries and veins are noted with normal celiac axis and SMA.  The bowel loops and mesentery are unremarkable.  Normal  appearing appendix is noted. Uterus and adnexal regions are normal.  Underlying bony structures are intact.  The visualized lung bases are unremarkable.        Impression    Normal CT of the abdomen and pelvis.                    Radiologist location ID: [id number]            IPMEDS:  acetaminophen (OFIRMEV) 1,000 mg (10 mg/mL) IV 100 mL (tot vol), 1,000 mg, Intravenous, Q6H PRN  aluminum-magnesium hydroxide-simethicone (MAG-AL PLUS) 200-200-20 mg per 5 mL oral liquid, 30 mL, Oral, Q4H PRN  [START ON 05/08/2024] aspirin (ECOTRIN) enteric  coated tablet 81 mg, 81 mg, Oral, Daily  atorvastatin (LIPITOR) tablet, 80 mg, Oral, QPM  baclofen (LIORESAL) tablet, 20 mg, Oral, 3x/day  [START ON 05/08/2024] carvedilol (COREG) tablet, 25 mg, Oral, 2x/day-Food  chlorhexidine gluconate (PERIDEX) 0.12% mouthwash, 15 mL, Swish & Spit, 2x/day  clonazePAM  (klonoPIN ) tablet, 1 mg, Oral, 3x/day  [START ON 05/08/2024] clopidogrel (PLAVIX) 75 mg tablet, 75 mg, Oral, Daily  electrolyte-R pH 7.4 (NORMOSOL-R pH 7.4) premix infusion, , Intravenous, See Comment   Or  D5W 1/2 NS premix infusion, , Intravenous, See Comment   Or  D10W premixed infusion, , Intravenous, See Comment  D5W 1/2 NS 1000 mL with potassium chloride 20 mEq premix infusion, , Intravenous, Continuous  dextrose 50% (0.5 g/mL) injection - syringe, 12.5 g, Intravenous, Q30 Min PRN  enoxaparin PF (LOVENOX) 60 mg/0.6 mL SubQ injection, 40 mg, Subcutaneous, Q24H  ezetimibe (ZETIA) tablet, 10 mg, Oral, QPM  [START ON 05/08/2024] fenofibrate (TRICOR) 145 mg tablet, 145 mg, Oral, Daily with Breakfast  hydrOXYzine  pamoate (VISTARIL ) capsule, 100 mg, Oral, NIGHTLY  insulin regular human (MYXREDLIN) 100 units in NS 100mL infusion - ADULT DKA, 0.05 Units/kg/hr, Intravenous, Continuous  [START ON 05/08/2024] isosorbide mononitrate (IMDUR) 24 hr extended release tablet, 30 mg, Oral, QAM  nitroGLYCERIN (NITROSTAT) sublingual tablet, 0.4 mg, Sublingual, Q5 Min PRN  nortriptyline  (PAMELOR )  capsule, 25 mg, Oral, NIGHTLY  [START ON 05/08/2024] pantoprazole (PROTONIX) 4 mg/mL injection, 40 mg, Intravenous, Daily   And  [START ON 05/08/2024] NS 10 mL injection, 10 mL, Intravenous, Daily  NS 250 mL flush bag, , Intravenous, Q15 Min PRN  NS flush syringe, 10 mL, Intracatheter, Q8HRS  NS flush syringe, 10 mL, Intracatheter, Q1H PRN  ondansetron (ZOFRAN) 2 mg/mL injection, 4 mg, Intravenous, Q4H PRN  promethazine (PHENERGAN) tablet, 25 mg, Oral, Q6H PRN  risperiDONE  (risperDAL ) tablet, 1 mg, Oral, 2x/day        Assessment/Plan:   Active Hospital Problems    Diagnosis    Primary Problem: DKA (diabetic ketoacidosis)       1. Diabetic ketoacidosis:  Patient presents with increasing nausea and vomiting and workup in the emergency room revealed diabetic ketoacidosis.  She was given IV fluids, IV insulin bolus, and was started on an insulin drip and will be admitted to our intensive care unit  2.  Coronary artery disease: Patient has had MI and stent placement within a year.  Initial troponin was 6.1  3. Hypertension: We will continue her current meds as tolerated   4. Type 2 diabetes mellitus: Patient follows with Endocrinology and she is on a schedule of U 500 and Humalog.  She is not on any long-acting insulin.  She takes 130 units of U 500 every morning, 105-130 units at 2:00 p.m., and 105-130 units at 9:00 p.m.Aaron Aas  She utilizes Humalog up to 60 units per day depending on elevated sugars not responding to U 500.  5.  Hyperlipidemia:  We will continue her current meds.  6.  Tobacco abuse: We will apply Nicoderm patch      DVT/PE Prophylaxis: Enoxaparin    Angelia Kelp, DO  Hospitalist - [facility]                  [1]   Allergies  Allergen Reactions    Invega [Paliperidone] Hives/ Urticaria     Patient also noted irregular heart rate, bloodshot eyes, and feeling unwell.    Ondansetron  Other Adverse Reaction (Add comment)     Prolonged QTC  Victoza [Liraglutide] Nausea/ Vomiting   [2]   Social  History  Tobacco Use    Smoking status: Every Day     Current packs/day: 0.50     Types: Cigarettes    Smokeless tobacco: Never   Substance Use Topics    Alcohol use: Never    Drug use: Never

## 2024-05-07 NOTE — ED Provider Notes (Signed)
 [facility] - Emergency Department  ED Primary Provider Note  History of Present Illness   Chief Complaint   Patient presents with    Hyperglycemia     Kristy Santiago is a 33 y.o. female who had concerns including Hyperglycemia.  This is a 33 year old woman with a history of diabetes, non ST elevation myocardial infarction, hypertension, anxiety, coronary stents, urinary tract infection.  She is complaining of fatigue, nausea, vomiting, vague upper abdominal pain for the last 1-2 weeks.  She states her blood sugar tends to be in the 200s, but that she has modified her usual insulin dose downward as she has been unable to eat or drink normally.  She has had multiple recent dental extractions.  Her symptoms are moderate, without modifying factors.  Her abdominal pain is vague, upper abdominal, nonradiating, with no other modifying factors.  She does complain of increased thirst and frequent urination.  She denies other symptoms, including fever, change in defecation, chest pain, shortness of breath, or other symptoms.    Physical Exam   ED Triage Vitals [05/07/24 1348]   BP (Non-Invasive) (!) 157/105   Heart Rate (!) 112   Respiratory Rate 16   Temperature 36.2 C (97.2 F)   SpO2 94 %   Weight 104 kg (230 lb)   Height 1.651 m (5' 5)     Constitutional:  33 y.o. female who appears in no distress. Normal color, no cyanosis.  Obese, hirsute  HENT:   Head: Normocephalic and atraumatic.   Mouth/Throat: Oropharynx is clear and moist.   Eyes: EOMI, PERRL   Neck: Trachea midline. Neck supple.  Cardiovascular: RRR, No murmurs, rubs or gallops. Intact distal pulses.  Pulmonary/Chest: BS equal bilaterally. No respiratory distress. No wheezes, rales or chest tenderness.   Abdominal: Bowel sounds present and normal. Abdomen tender in the epigastrium, negative Murphy's sign.  Some percussion tenderness radiating to the epigastrium.  Back: No midline spinal tenderness, no paraspinal tenderness, no CVA tenderness.            Musculoskeletal: No edema, tenderness or deformity.  Skin: warm and dry. No rash, erythema, pallor or cyanosis  Psychiatric: normal mood and affect. Behavior is normal.   Neurological: Patient keenly alert and responsive, easily able to raise eyebrows, facial muscles/expressions symmetric, speaking in fluent sentences, moving all extremities equally and fully, normal gait    Patient Data   Labs Ordered/Reviewed   BASIC METABOLIC PANEL - Abnormal; Notable for the following components:       Result Value    SODIUM 132 (*)     CO2 TOTAL 20 (*)     ANION GAP 15 (*)     GLUCOSE 590 (*)     BUN 5 (*)     CREATININE 1.10 (*)     BUN/CREA RATIO 5 (*)     All other components within normal limits   CBC WITH DIFF - Abnormal; Notable for the following components:    RBC 5.32 (*)     MCV 76.3 (*)     All other components within normal limits   URINALYSIS, MACROSCOPIC - Abnormal; Notable for the following components:    GLUCOSE >=1000 (*)     BLOOD Large (*)     All other components within normal limits   URINALYSIS, MICROSCOPIC - Abnormal; Notable for the following components:    RBCS TNTC (*)     SQUAMOUS EPITHELIAL CELLS Few (*)     All other components within normal limits  LIPASE - Abnormal; Notable for the following components:    LIPASE 116 (*)     All other components within normal limits   HEPATIC FUNCTION PANEL - Abnormal; Notable for the following components:    ALKALINE PHOSPHATASE 174 (*)     ALT (SGPT) 73 (*)     AST (SGOT)  52 (*)     All other components within normal limits   MANUAL DIFF AND MORPHOLOGY-SYSMEX - Abnormal; Notable for the following components:    MONOCYTE # 0.05 (*)     All other components within normal limits   BASIC METABOLIC PANEL - Abnormal; Notable for the following components:    CO2 TOTAL 21 (*)     GLUCOSE 277 (*)     BUN 4 (*)     BUN/CREA RATIO 5 (*)     All other components within normal limits   PHOSPHORUS - Abnormal; Notable for the following components:    PHOSPHORUS 1.8  (*)     All other components within normal limits   POC BLOOD GLUCOSE (RESULTS) - Abnormal; Notable for the following components:    GLUCOSE, POC 582 (*)     All other components within normal limits   POC BLOOD GLUCOSE (RESULTS) - Abnormal; Notable for the following components:    GLUCOSE, POC 445 (*)     All other components within normal limits   POC BLOOD GLUCOSE (RESULTS) - Abnormal; Notable for the following components:    GLUCOSE, POC 322 (*)     All other components within normal limits   HCG, URINE QUALITATIVE, PREGNANCY - Normal    Narrative:     This is a screening test only.  All positives should be confirmed using a quantitiative HCG assay.   MAGNESIUM - Normal   TROPONIN-I - Normal    Narrative:     TROPONIN INTERPRETATIVE COMMENT:    *ERRONEOUS RESULTS MAY BE OBTAINED ON SAMPLES FROM PATIENTS WHO HAVE BEEN TREATED WITH MOUSE MONOCLONAL ANTIBODIES OR WHO HAVE RECEIVED THEM FOR DIAGNOSTIC PURPOSES.      TROPONIN I VALUES ARE OF THE GREATEST VALUE WHEN SERIALLY PERFORMED.    PRESENCE OF HETEROPHILE ANTIBODIES HAVE BEEN KNOWN TO CAUSE ERRONEOUS RESULTS.    CBC/DIFF    Narrative:     The following orders were created for panel order CBC/DIFF.  Procedure                               Abnormality         Status                     ---------                               -----------         ------                     CBC WITH DIFF[[id number]]                Abnormal            Final result               MANUAL DIFF AND MORPHOLO.Aaron AasAaron Aas[[id number]]  Abnormal            Final result  Please view results for these tests on the individual orders.   URINALYSIS, MACROSCOPIC AND MICROSCOPIC W/CULTURE REFLEX    Narrative:     The following orders were created for panel order URINALYSIS, MACROSCOPIC AND MICROSCOPIC W/CULTURE REFLEX.  Procedure                               Abnormality         Status                     ---------                               -----------         ------                      URINALYSIS, MACROSCOPIC[[id number]]      Abnormal            Final result               URINALYSIS, MICROSCOPIC[[id number]]      Abnormal            Final result                 Please view results for these tests on the individual orders.   EXTRA TUBES    Narrative:     The following orders were created for panel order EXTRA TUBES.  Procedure                               Abnormality         Status                     ---------                               -----------         ------                     BLUE TOP JXBJ[[id number]]                                    Final result               GOLD TOP HYQM[[id number]]                                    Final result               LIGHT GREEN TOP BMWU[[id number]]                             Final result               LAVENDER TOP VOZD[[id number]]                                Final result                 Please  view results for these tests on the individual orders.   BLUE TOP TUBE   GOLD TOP TUBE   LIGHT GREEN TOP TUBE   LAVENDER TOP TUBE   BASIC METABOLIC PANEL   MAGNESIUM   MAGNESIUM   PHOSPHORUS   PHOSPHORUS   TROPONIN-I   PERFORM POC WHOLE BLOOD GLUCOSE   PERFORM POC WHOLE BLOOD GLUCOSE   PERFORM POC WHOLE BLOOD GLUCOSE   PERFORM POC WHOLE BLOOD GLUCOSE   PERFORM POC WHOLE BLOOD GLUCOSE   PERFORM POC WHOLE BLOOD GLUCOSE   PERFORM POC WHOLE BLOOD GLUCOSE   PERFORM POC WHOLE BLOOD GLUCOSE   PERFORM POC WHOLE BLOOD GLUCOSE     CT ABDOMEN PELVIS W IV CONTRAST   Final Result by Edi, Radresults In (06/12 1526)   Normal CT of the abdomen and pelvis.                              Radiologist location ID: [facility]           Medical Decision Making        Medical Decision Making  Differential diagnosis includes hyperglycemia, DKA, electrolyte abnormality, renal dysfunction, pancreatitis, liver injury, renal injury, small-bowel obstruction    Amount and/or Complexity of Data Reviewed  Labs: ordered.  Radiology: ordered.    Risk  Prescription drug management.  Decision  regarding hospitalization.      ED Course as of 05/07/24 2126   Thu May 07, 2024   1445 Ordering insulin drip.  Awaiting CT abdomen results.   1605 Presented to Dr. Gurney Lefort, who requests transfer out as we do not have a unit bed.   1608 Our staff tells me that we do have a unit bed available.  Have left a message with Dr. Gurney Lefort to call back.   1645 Discussed again with Dr. Gurney Lefort, who states he will get back to us .   1827 Discussed with Dr. Gurney Lefort, who requests that I talked to the 7:00 p.m. doctor, who he believes to be Dr. Hildy Lowers.  Dr. Gurney Lefort states that we now have two unit beds available.   1910 Dr. Hildy Lowers will come see her                   Medications Ordered/Administered in the ED   electrolyte-R pH 7.4 (NORMOSOL-R pH 7.4) premix infusion (has no administration in time range)     Or   D5W 1/2 NS premix infusion (has no administration in time range)     Or   D10W premixed infusion (has no administration in time range)   insulin regular human (MYXREDLIN) 100 units in NS infusion - ADULT DKA (0.05 Units/kg/hr  104 kg Intravenous New Bag/New Syringe 05/07/24 1616)   dextrose 50% (0.5 g/mL) injection - syringe (has no administration in time range)   potassium chloride 20 mEq in SW 100 mL premix infusion (0 mEq Intravenous Stopped 05/07/24 1837)   promethazine (PHENERGAN) tablet (25 mg Oral Given 05/07/24 1742)   NS bolus infusion 1,000 mL (0 mL Intravenous Stopped 05/07/24 1502)   iopamidol (ISOVUE-370) 76% infusion (50 mL Intravenous Given 05/07/24 1443)   insulin glargine-yfgn 100 Units/mL SubQ pen (10 Units Subcutaneous Given 05/07/24 1615)   electrolyte-pH 7.4 (NORMOSOL-R pH 7.4) bolus infusion 1,000 mL (0 mL Intravenous Stopped 05/07/24 1711)   potassium chloride (KCLOR-CON) extended release tablet (40 mEq Oral Given 05/07/24 1611)     Clinical Impression   DKA (diabetic  ketoacidosis) (Primary)   Pancreatitis       Disposition: Admitted    Critical Care Attestation:  As the ED physician, I have provided  critical care for this patient.  This patient has high probability of imminent life or limb threatening deterioration due to acidosis and diabetic ketoacidosis.  I provided direct patient care, documentation of findings, review of medical records, discussion with patient/family, crystalloid volume resuscitation, interpretation of EKG, directing of titration of insulin drip, consultation with hospitalists, and coordination of multidisciplinary care and frequent reassessment.  Time spent providing critical care (exclusive of any billed procedures and/or teaching): 40 minutes.

## 2024-05-07 NOTE — ED Nurses Note (Signed)
 Report given to [name] , pt to room 55.

## 2024-05-08 ENCOUNTER — Inpatient Hospital Stay (HOSPITAL_COMMUNITY)

## 2024-05-08 ENCOUNTER — Encounter (HOSPITAL_COMMUNITY): Payer: Self-pay | Admitting: INTERNAL MEDICINE

## 2024-05-08 DIAGNOSIS — R112 Nausea with vomiting, unspecified: Secondary | ICD-10-CM | POA: Insufficient documentation

## 2024-05-08 DIAGNOSIS — R7989 Other specified abnormal findings of blood chemistry: Secondary | ICD-10-CM | POA: Insufficient documentation

## 2024-05-08 LAB — POC BLOOD GLUCOSE (RESULTS)
GLUCOSE, POC: 207 mg/dL — ABNORMAL HIGH (ref 70–99)
GLUCOSE, POC: 217 mg/dL — ABNORMAL HIGH (ref 70–99)
GLUCOSE, POC: 218 mg/dL — ABNORMAL HIGH (ref 70–99)
GLUCOSE, POC: 219 mg/dL — ABNORMAL HIGH (ref 70–99)
GLUCOSE, POC: 225 mg/dL — ABNORMAL HIGH (ref 70–99)
GLUCOSE, POC: 227 mg/dL — ABNORMAL HIGH (ref 70–99)
GLUCOSE, POC: 229 mg/dL — ABNORMAL HIGH (ref 70–99)
GLUCOSE, POC: 231 mg/dL — ABNORMAL HIGH (ref 70–99)
GLUCOSE, POC: 232 mg/dL — ABNORMAL HIGH (ref 70–99)
GLUCOSE, POC: 235 mg/dL — ABNORMAL HIGH (ref 70–99)
GLUCOSE, POC: 241 mg/dL — ABNORMAL HIGH (ref 70–99)
GLUCOSE, POC: 242 mg/dL — ABNORMAL HIGH (ref 70–99)
GLUCOSE, POC: 244 mg/dL — ABNORMAL HIGH (ref 70–99)
GLUCOSE, POC: 244 mg/dL — ABNORMAL HIGH (ref 70–99)
GLUCOSE, POC: 249 mg/dL — ABNORMAL HIGH (ref 70–99)
GLUCOSE, POC: 252 mg/dL — ABNORMAL HIGH (ref 70–99)
GLUCOSE, POC: 256 mg/dL — ABNORMAL HIGH (ref 70–99)
GLUCOSE, POC: 258 mg/dL — ABNORMAL HIGH (ref 70–99)
GLUCOSE, POC: 260 mg/dL — ABNORMAL HIGH (ref 70–99)
GLUCOSE, POC: 282 mg/dL — ABNORMAL HIGH (ref 70–99)
GLUCOSE, POC: 290 mg/dL — ABNORMAL HIGH (ref 70–99)
GLUCOSE, POC: 330 mg/dL — ABNORMAL HIGH (ref 70–99)
GLUCOSE, POC: 336 mg/dL — ABNORMAL HIGH (ref 70–99)

## 2024-05-08 LAB — CBC WITH DIFF
BASOPHIL #: <0.04 10*3/uL (ref <=0.20)
BASOPHIL %: 0.3 %
EOSINOPHIL #: 0.14 10*3/uL (ref <=0.50)
EOSINOPHIL %: 2.4 %
HCT: 35.9 % (ref 34.8–46.0)
HGB: 12.5 g/dL (ref 11.5–16.0)
IMMATURE GRANULOCYTE #: <0.04 10*3/uL (ref <0.10)
IMMATURE GRANULOCYTE %: 0.3 % (ref 0.0–1.0)
LYMPHOCYTE #: 2.37 10*3/uL (ref 1.00–4.80)
LYMPHOCYTE %: 41.2 %
MCH: 26.8 pg (ref 26.0–32.0)
MCHC: 34.8 g/dL (ref 31.0–35.5)
MCV: 76.9 fL — ABNORMAL LOW (ref 78.0–100.0)
MONOCYTE #: 0.2 10*3/uL (ref 0.20–1.10)
MONOCYTE %: 3.5 %
MPV: 9.8 fL (ref 8.7–12.5)
NEUTROPHIL #: 3 10*3/uL (ref 1.50–7.70)
NEUTROPHIL %: 52.3 %
PLATELETS: 262 10*3/uL (ref 150–400)
RBC: 4.67 10*6/uL (ref 3.85–5.22)
RDW-CV: 13.7 % (ref 11.5–15.5)
WBC: 5.8 10*3/uL (ref 3.7–11.0)

## 2024-05-08 LAB — LIPASE: LIPASE: 74 U/L — ABNORMAL HIGH (ref 10–60)

## 2024-05-08 LAB — COMPREHENSIVE METABOLIC PANEL, NON-FASTING
ALBUMIN: 3 g/dL — ABNORMAL LOW (ref 3.5–5.0)
ALKALINE PHOSPHATASE: 140 U/L — ABNORMAL HIGH (ref 40–110)
ALT (SGPT): 60 U/L — ABNORMAL HIGH (ref <31)
ANION GAP: 9 mmol/L (ref 4–13)
AST (SGOT): 60 U/L — ABNORMAL HIGH (ref 11–34)
BILIRUBIN TOTAL: 0.3 mg/dL (ref 0.3–1.3)
BUN/CREA RATIO: 7 (ref 6–22)
BUN: 5 mg/dL — ABNORMAL LOW (ref 8–25)
CALCIUM: 8.7 mg/dL (ref 8.6–10.2)
CHLORIDE: 105 mmol/L (ref 96–111)
CO2 TOTAL: 22 mmol/L (ref 22–30)
CREATININE: 0.7 mg/dL (ref 0.60–1.05)
ESTIMATED GFR - FEMALE: >90 mL/min/BSA (ref >=60)
GLUCOSE: 209 mg/dL — ABNORMAL HIGH (ref 65–125)
POTASSIUM: 3.6 mmol/L (ref 3.5–5.1)
PROTEIN TOTAL: 6.5 g/dL (ref 6.4–8.3)
SODIUM: 136 mmol/L (ref 136–145)

## 2024-05-08 LAB — BASIC METABOLIC PANEL
ANION GAP: 10 mmol/L (ref 4–13)
BUN/CREA RATIO: 6 (ref 6–22)
BUN: 5 mg/dL — ABNORMAL LOW (ref 8–25)
CALCIUM: 8.6 mg/dL (ref 8.6–10.2)
CHLORIDE: 104 mmol/L (ref 96–111)
CO2 TOTAL: 22 mmol/L (ref 22–30)
CREATININE: 0.8 mg/dL (ref 0.60–1.05)
ESTIMATED GFR - FEMALE: >90 mL/min/BSA (ref >=60)
GLUCOSE: 220 mg/dL — ABNORMAL HIGH (ref 65–125)
POTASSIUM: 3.4 mmol/L — ABNORMAL LOW (ref 3.5–5.1)
SODIUM: 136 mmol/L (ref 136–145)

## 2024-05-08 LAB — THYROID STIMULATING HORMONE (SENSITIVE TSH): TSH: 5.785 u[IU]/mL — ABNORMAL HIGH (ref 0.490–4.670)

## 2024-05-08 LAB — MAGNESIUM: MAGNESIUM: 2 mg/dL (ref 1.8–2.6)

## 2024-05-08 LAB — PHOSPHORUS: PHOSPHORUS: 2.4 mg/dL (ref 2.4–4.7)

## 2024-05-08 MED ORDER — POTASSIUM CHLORIDE 40 MEQ/L IN DEXTROSE 5 %-0.45 % SODIUM CHLORIDE IV
INTRAVENOUS | Status: DC
Start: 2024-05-08 — End: 2024-05-09
  Administered 2024-05-08 – 2024-05-09 (×2): 0 mL via INTRAVENOUS
  Filled 2024-05-08 (×6): qty 1000

## 2024-05-08 NOTE — Care Plan (Signed)
 Pt NSR on tele. HR currently 88. Pt 1 assist to the bathroom. Pt urinated once so far this shift at 1300. Pt reports feeling some better this evening but still exhausted. VSS.     Problem: Adult Inpatient Plan of Care  Goal: Plan of Care Review  Outcome: Ongoing (see interventions/notes)  Goal: Patient-Specific Goal (Individualized)  Outcome: Ongoing (see interventions/notes)  Goal: Absence of Hospital-Acquired Illness or Injury  Outcome: Ongoing (see interventions/notes)  Intervention: Identify and Manage Fall Risk  Recent Flowsheet Documentation  Taken 05/08/2024 1700 by Raeford Bullion, RN  Safety Promotion/Fall Prevention: fall prevention program maintained  Taken 05/08/2024 1300 by Raeford Bullion, RN  Safety Promotion/Fall Prevention: fall prevention program maintained  Taken 05/08/2024 0745 by Raeford Bullion, RN  Safety Promotion/Fall Prevention: fall prevention program maintained  Intervention: Prevent Skin Injury  Recent Flowsheet Documentation  Taken 05/08/2024 0745 by Raeford Bullion, RN  Skin Protection: adhesive use limited  Goal: Optimal Comfort and Wellbeing  Outcome: Ongoing (see interventions/notes)  Goal: Rounds/Family Conference  Outcome: Ongoing (see interventions/notes)

## 2024-05-08 NOTE — Progress Notes (Signed)
 [facility]  Hospitalist Progress Note  FULL CODE: ATTEMPT RESUSCITATION/CPR    Kristy Santiago  Date of service: 05/08/2024  Date of Admission:  05/07/2024  Hospital Day:  LOS: 1 day     Subjective/Interval Events: 32 y.o. female with past medical history of type 2 diabetes mellitus on insulin  for 9 years and follows with Endocrinology, coronary artery disease status post MI and stent deployment last year at [facility], long QT syndrome, hypertension, hyperlipidemia, obesity. Patient admitted to [facility] yesterday with concern for DKA. Appears resolving with improvement in bicarb and resolution of anion gap. Patient remains with nausea and some mild epigastric pain. Lab data significant for elevated LFT's. CT abd/pelvis showed normal study. Plan to check MRI of abdomen/MRCP to evaluate liver and bile ducts given persistent mild elevation of LFTs and pancreatic enzymes. Patient remains on insulin  drip currently given NPO status and dyspepsia symptoms.     Vital Signs:  Temp (24hrs) Max:37.2 C (98.9 F)      Systolic (24hrs), Avg:152 , Min:143 , Max:162     Diastolic (24hrs), Avg:93, Min:85, Max:105    Temp  Avg: 36.6 C (97.9 F)  Min: 36.2 C (97.2 F)  Max: 37.2 C (98.9 F)  MAP (Non-Invasive)  Avg: 107.3 mmHG  Min: 102 mmHG  Max: 113 mmHG  Pulse  Avg: 105.1  Min: 97  Max: 117  Resp  Avg: 18.8  Min: 14  Max: 30  SpO2  Avg: 94.5 %  Min: 93 %  Max: 97 %       I/O:  I/O last 24 hours:    Intake/Output Summary (Last 24 hours) at 05/08/2024 1011  Last data filed at 05/08/2024 0843  Gross per 24 hour   Intake 2199 ml   Output --   Net 2199 ml     I/O current shift:  06/13 0700 - 06/13 1859  In: 100   Out: -       acetaminophen  (OFIRMEV ) 1,000 mg (10 mg/mL) IV 100 mL (tot vol), 1,000 mg, Intravenous, Q6H PRN  aluminum -magnesium hydroxide-simethicone  (MAG-AL PLUS) 200-200-20 mg per 5 mL oral liquid, 30 mL, Oral, Q4H PRN  aspirin  (ECOTRIN) enteric coated tablet 81 mg, 81 mg, Oral, Daily  atorvastatin  (LIPITOR) tablet, 80 mg, Oral,  QPM  baclofen  (LIORESAL ) tablet, 20 mg, Oral, 3x/day  carvedilol  (COREG ) tablet, 25 mg, Oral, 2x/day-Food  chlorhexidine  gluconate (PERIDEX ) 0.12% mouthwash, 15 mL, Swish & Spit, 2x/day  clonazePAM  (klonoPIN ) tablet, 1 mg, Oral, 3x/day  clopidogrel  (PLAVIX ) 75 mg tablet, 75 mg, Oral, Daily  D5W 1/2 NS 1000 mL with potassium chloride  40 mEq premix infusion, , Intravenous, Continuous  dextrose  50% (0.5 g/mL) injection - syringe, 12.5 g, Intravenous, Q30 Min PRN  DKA-RN to Replace Potassium Placeholder, , Other, Q4H PRN  enoxaparin  PF (LOVENOX ) 60 mg/0.6 mL SubQ injection, 40 mg, Subcutaneous, Q24H  ezetimibe  (ZETIA ) tablet, 10 mg, Oral, QPM  fenofibrate  (TRICOR ) 145 mg tablet, 145 mg, Oral, Daily with Breakfast  hydrOXYzine  pamoate (VISTARIL ) capsule, 100 mg, Oral, NIGHTLY  insulin  glargine-yfgn 100 Units/mL SubQ pen, 10 Units, Subcutaneous, Once  insulin  regular human (MYXREDLIN ) 100 units in NS infusion - ADULT DKA, 0.1 Units/kg/hr, Intravenous, Continuous  isosorbide  mononitrate (IMDUR ) 24 hr extended release tablet, 30 mg, Oral, QAM  nicotine  (NICODERM CQ ) transdermal patch (mg/24 hr), 14 mg, Transdermal, Once  nitroGLYCERIN  (NITROSTAT ) sublingual tablet, 0.4 mg, Sublingual, Q5 Min PRN  nortriptyline  (PAMELOR ) capsule, 25 mg, Oral, NIGHTLY  pantoprazole  (PROTONIX ) 4 mg/mL injection, 40 mg, Intravenous, Daily  And  NS 10 mL injection, 10 mL, Intravenous, Daily  NS 250 mL flush bag, , Intravenous, Q15 Min PRN  NS flush syringe, 10 mL, Intracatheter, Q8HRS  NS flush syringe, 10 mL, Intracatheter, Q1H PRN  promethazine  (PHENERGAN ) tablet, 25 mg, Oral, Q6H PRN  risperiDONE  (risperDAL ) tablet, 1 mg, Oral, 2x/day        Physical Exam:    Constitutional: Patient appears stated age, sitting in bed, appears uncomfortable however in no acute distress   Eyes:  Conjunctiva clear, Pupils equal and round.   Neck:  Supple, symmetrical, trachea midline.  Respiratory:  No audible wheezing or crackles   Cardiovascular:   Regular rate and rhythm, no murmurs.  Gastrointestinal: Abdomen soft, mild tenderness epigastric region.   Musculoskeletal:  Moves all 4 limbs  Integumentary:  Skin warm and dry, No rashes or lesions.  Neurologic:  CN II - XII grossly intact, No tremor.   Extremities: No significant BLE edema     Labs:    CBC (Last 24 Hours):    Recent Results last 24 hours     05/07/24  1401 05/08/24  0548   WBC 5.4 5.8   HGB 13.9 12.5   HCT 40.6 35.9   MCV 76.3* 76.9*   PLTCNT 302 262       Last BMP  (Last result in the past 24 hours)        Na   K   Cl   CO2   BUN   Cr   Calcium   Glucose   Glucose-Fasting        05/08/24 0548 136   3.6   105   22   5   0.70   8.7  Comment: Gadolinium-containing contrast can interfere with calcium measurement.     209               Last Hepatic Panel  (Last result in the past 24 hours)        Albumin   Total PTN   Total Bili   Direct Bili   Ast/SGOT   Alt/SGPT   Alk Phos        05/08/24 0548 3.0   6.5   0.3  Comment: Naproxen therapy can falsely elevate total bilirubin levels.     60   60   140             Recent Labs     05/08/24  0548   MAGNESIUM 2.0   PHOSPHORUS 2.4     No results for input(s): INR, APTT in the last 24 hours.  No results found for: UHCEASTTROPI      Imaging:    Results for orders placed or performed during the hospital encounter of 05/07/24 (from the past 24 hours)   CT ABDOMEN PELVIS W IV CONTRAST     Status: None    Narrative    Kristy Santiago    CLINICAL HISTORY: abd pain, V, hyperglycemia    COMPARISON: No relevant comparison.    FINDINGS:  CT examination of the abdomen and pelvis was performed following intravenous contrast enhancement.    Normal appearing liver is noted.  Normal spleen is noted.  Normal gallbladder is noted. The remainder of the biliary tree is normal.   Normal pancreas is noted.    The kidneys  adrenal glands are unremarkable. The ureters and bladder are normal.  No evidence of mass or lymphadenopathy is noted.       Normal visceral arteries  and  veins are noted with normal celiac axis and SMA.  The bowel loops and mesentery are unremarkable.  Normal appearing appendix is noted. Uterus and adnexal regions are normal.  Underlying bony structures are intact.  The visualized lung bases are unremarkable.        Impression    Normal CT of the abdomen and pelvis.                    Radiologist location ID: [id number]     XR CHEST AP     Status: None    Narrative    Kristy Santiago    XR CHEST AP performed on 05/08/2024 5:37 AM.    REASON FOR EXAM:  SOB     TECHNIQUE: 1 views/1 images submitted for interpretation.    COMPARISON:  None    FINDINGS:   Cardiac silhouette is unremarkable.  The lungs are clear without any focal infiltrate effusion or mass.  The hilar and mediastinal structures are unremarkable.  The bony structures are unremarkable.      Impression    No acute parenchymal process.            Radiologist location ID: [facility]           Microbiology:  No results found for any visits on 05/07/24 (from the past 96 hours).          Assessment/ Plan:     Active Hospital Problems    Diagnosis    Primary Problem: DKA (diabetic ketoacidosis)    Nausea and vomiting    Elevated LFTs       DKA: Appears resolving, bicarb and anion gap have returned to normal range. Currently on insulin  drip, consider switch to long and short acting insulin  if tolerating oral intake and imaging of abdomen appears unremarkable.   Nausea/Vomiting: Persistent symptoms over the past 1 week. Remains with Phenergan  prn for N/V. Empiric GI prophylaxis with IV PPI. CT imaging of abd/pelvis unremarkable per Radiology read. Plan to check MRI of abdomen to further evaluate. Continued on IV fluids.   Elevated LFT's: Elevation in AST/ALT, Alk phos and mild elevation in Lipase. Dyspepsia symptoms with N/V and epigastric pain over the past 7-10 days. CT imaging of abd/pelvis report reviewed, plan to check MRI imaging to evaluate liver, pancreas and ducts to exclude obstruction.     Chronic  medical conditions:   1. Coronary artery disease: Patient has had MI and stent placement within a year.  Initial troponin was 6.1  2. Hypertension: We will continue her current meds as tolerated   3. Type 2 diabetes mellitus: Patient follows with Endocrinology and she is on a schedule of U 500 and Humalog.  She is not on any long-acting insulin .  She takes 130 units of U 500 every morning, 105-130 units at 2:00 p.m., and 105-130 units at 9:00 p.m.Aaron Aas  She utilizes Humalog up to 60 units per day depending on elevated sugars not responding to U 500.  4.  Hyperlipidemia:  We will continue her current meds.  5.  Tobacco abuse: We will apply Nicoderm patch      DVT/PE Prophylaxis: Enoxaparin   Diet: DIET NPO - NOW    Gary Kapur, DO  Hospitalist, [facility]

## 2024-05-09 LAB — POC BLOOD GLUCOSE (RESULTS)
GLUCOSE, POC: 176 mg/dL — ABNORMAL HIGH (ref 70–99)
GLUCOSE, POC: 196 mg/dL — ABNORMAL HIGH (ref 70–99)
GLUCOSE, POC: 205 mg/dL — ABNORMAL HIGH (ref 70–99)
GLUCOSE, POC: 205 mg/dL — ABNORMAL HIGH (ref 70–99)
GLUCOSE, POC: 209 mg/dL — ABNORMAL HIGH (ref 70–99)
GLUCOSE, POC: 210 mg/dL — ABNORMAL HIGH (ref 70–99)
GLUCOSE, POC: 223 mg/dL — ABNORMAL HIGH (ref 70–99)
GLUCOSE, POC: 224 mg/dL — ABNORMAL HIGH (ref 70–99)
GLUCOSE, POC: 226 mg/dL — ABNORMAL HIGH (ref 70–99)
GLUCOSE, POC: 227 mg/dL — ABNORMAL HIGH (ref 70–99)
GLUCOSE, POC: 227 mg/dL — ABNORMAL HIGH (ref 70–99)
GLUCOSE, POC: 235 mg/dL — ABNORMAL HIGH (ref 70–99)
GLUCOSE, POC: 241 mg/dL — ABNORMAL HIGH (ref 70–99)
GLUCOSE, POC: 242 mg/dL — ABNORMAL HIGH (ref 70–99)
GLUCOSE, POC: 246 mg/dL — ABNORMAL HIGH (ref 70–99)
GLUCOSE, POC: 257 mg/dL — ABNORMAL HIGH (ref 70–99)
GLUCOSE, POC: 261 mg/dL — ABNORMAL HIGH (ref 70–99)
GLUCOSE, POC: 262 mg/dL — ABNORMAL HIGH (ref 70–99)
GLUCOSE, POC: 265 mg/dL — ABNORMAL HIGH (ref 70–99)
GLUCOSE, POC: 265 mg/dL — ABNORMAL HIGH (ref 70–99)
GLUCOSE, POC: 266 mg/dL — ABNORMAL HIGH (ref 70–99)
GLUCOSE, POC: 267 mg/dL — ABNORMAL HIGH (ref 70–99)
GLUCOSE, POC: 270 mg/dL — ABNORMAL HIGH (ref 70–99)
GLUCOSE, POC: 275 mg/dL — ABNORMAL HIGH (ref 70–99)
GLUCOSE, POC: 286 mg/dL — ABNORMAL HIGH (ref 70–99)

## 2024-05-09 LAB — COMPREHENSIVE METABOLIC PANEL, NON-FASTING
ALBUMIN: 2.8 g/dL — ABNORMAL LOW (ref 3.5–5.0)
ALKALINE PHOSPHATASE: 126 U/L — ABNORMAL HIGH (ref 40–110)
ALT (SGPT): 51 U/L — ABNORMAL HIGH (ref <31)
ANION GAP: 6 mmol/L (ref 4–13)
AST (SGOT): 49 U/L — ABNORMAL HIGH (ref 11–34)
BILIRUBIN TOTAL: 0.3 mg/dL (ref 0.3–1.3)
BUN/CREA RATIO: 4 — ABNORMAL LOW (ref 6–22)
BUN: 3 mg/dL — ABNORMAL LOW (ref 8–25)
CALCIUM: 8.7 mg/dL (ref 8.6–10.2)
CHLORIDE: 110 mmol/L (ref 96–111)
CO2 TOTAL: 21 mmol/L — ABNORMAL LOW (ref 22–30)
CREATININE: 0.7 mg/dL (ref 0.60–1.05)
ESTIMATED GFR - FEMALE: >90 mL/min/BSA (ref >=60)
GLUCOSE: 263 mg/dL — ABNORMAL HIGH (ref 65–125)
POTASSIUM: 4.5 mmol/L (ref 3.5–5.1)
PROTEIN TOTAL: 6.3 g/dL — ABNORMAL LOW (ref 6.4–8.3)
SODIUM: 137 mmol/L (ref 136–145)

## 2024-05-09 LAB — LAVENDER TOP TUBE

## 2024-05-09 LAB — C.DIFF TOXIN DNA: C.DIFF TOXIN DNA: POSITIVE — AB

## 2024-05-09 MED ORDER — METRONIDAZOLE 250 MG TABLET
250.0000 mg | ORAL_TABLET | Freq: Three times a day (TID) | ORAL | Status: DC
Start: 2024-05-09 — End: 2024-05-12
  Administered 2024-05-09 – 2024-05-12 (×9): 250 mg via ORAL
  Filled 2024-05-09 (×10): qty 1

## 2024-05-09 MED ORDER — VANCOMYCIN 250 MG CAPSULE
250.0000 mg | ORAL_CAPSULE | Freq: Four times a day (QID) | ORAL | Status: DC
Start: 2024-05-09 — End: 2024-05-12
  Administered 2024-05-09 – 2024-05-12 (×12): 250 mg via ORAL
  Filled 2024-05-09 (×13): qty 1

## 2024-05-09 MED ORDER — PANTOPRAZOLE 40 MG TABLET,DELAYED RELEASE
40.0000 mg | DELAYED_RELEASE_TABLET | Freq: Two times a day (BID) | ORAL | Status: DC
Start: 2024-05-09 — End: 2024-05-12
  Administered 2024-05-09 – 2024-05-12 (×6): 40 mg via ORAL
  Filled 2024-05-09 (×6): qty 1

## 2024-05-09 NOTE — Care Plan (Signed)
 Pt lying supine. Pt on ra with rr deep and even with no signs of distress. Pt on tele NSR with HR from 60s to 80s. Pt maintained blood sugars all night. Pt had an uneventful night. Pt urinated over 1500 ml. No concerns or complaints voiced at this time. Safety precautions maintained. Call bell within reach. Will continue to monitor pt.   Problem: Adult Inpatient Plan of Care  Goal: Plan of Care Review  Outcome: Ongoing (see interventions/notes)  Goal: Patient-Specific Goal (Individualized)  Outcome: Ongoing (see interventions/notes)  Goal: Absence of Hospital-Acquired Illness or Injury  Outcome: Ongoing (see interventions/notes)  Intervention: Identify and Manage Fall Risk  Recent Flowsheet Documentation  Taken 05/09/2024 0500 by Gwenn Lenz, RN  Safety Promotion/Fall Prevention:   activity supervised   fall prevention program maintained   motion sensor pad activated   nonskid shoes/slippers when out of bed   safety round/check completed  Taken 05/09/2024 0303 by Gwenn Lenz, RN  Safety Promotion/Fall Prevention:   activity supervised   fall prevention program maintained   motion sensor pad activated   nonskid shoes/slippers when out of bed   safety round/check completed  Taken 05/09/2024 0100 by Gwenn Lenz, RN  Safety Promotion/Fall Prevention:   activity supervised   fall prevention program maintained   motion sensor pad activated   nonskid shoes/slippers when out of bed   safety round/check completed  Taken 05/08/2024 2302 by Gwenn Lenz, RN  Safety Promotion/Fall Prevention:   activity supervised   fall prevention program maintained   motion sensor pad activated   nonskid shoes/slippers when out of bed   safety round/check completed  Taken 05/08/2024 2115 by Gwenn Lenz, RN  Safety Promotion/Fall Prevention:   activity supervised   fall prevention program maintained   motion sensor pad activated   nonskid shoes/slippers when out of bed   safety round/check completed  Taken 05/08/2024 1912 by Gwenn Lenz, RN  Safety Promotion/Fall  Prevention:   activity supervised   fall prevention program maintained   motion sensor pad activated   nonskid shoes/slippers when out of bed   safety round/check completed  Intervention: Prevent Skin Injury  Recent Flowsheet Documentation  Taken 05/08/2024 2300 by Gwenn Lenz, RN  Skin Protection: adhesive use limited  Taken 05/08/2024 1912 by Gwenn Lenz, RN  Body Position: supine  Skin Protection: adhesive use limited  Intervention: Prevent and Manage VTE (Venous Thromboembolism) Risk  Recent Flowsheet Documentation  Taken 05/09/2024 0303 by Gwenn Lenz, RN  VTE Prevention/Management:   ambulation promoted   dorsiflexion/plantar flexion performed  Taken 05/08/2024 2302 by Gwenn Lenz, RN  VTE Prevention/Management:   ambulation promoted   dorsiflexion/plantar flexion performed  Taken 05/08/2024 1912 by Gwenn Lenz, RN  VTE Prevention/Management:   ambulation promoted   dorsiflexion/plantar flexion performed  Intervention: Prevent Infection  Recent Flowsheet Documentation  Taken 05/09/2024 0500 by Gwenn Lenz, RN  Infection Prevention:   barrier precautions utilized   glycemic control managed   promote handwashing   rest/sleep promoted  Taken 05/09/2024 0303 by Gwenn Lenz, RN  Infection Prevention:   barrier precautions utilized   glycemic control managed   promote handwashing   rest/sleep promoted  Taken 05/09/2024 0100 by Gwenn Lenz, RN  Infection Prevention:   barrier precautions utilized   glycemic control managed   promote handwashing   rest/sleep promoted  Taken 05/08/2024 2302 by Gwenn Lenz, RN  Infection Prevention:   barrier precautions utilized   glycemic control managed   promote handwashing   rest/sleep promoted  Taken 05/08/2024 2115 by Gwenn Lenz, RN  Infection Prevention:   barrier precautions utilized   glycemic control managed   promote handwashing   rest/sleep promoted  Taken 05/08/2024 1912 by Gwenn Lenz, RN  Infection Prevention:   barrier precautions utilized   promote handwashing   rest/sleep promoted   glycemic control  managed  Goal: Optimal Comfort and Wellbeing  Outcome: Ongoing (see interventions/notes)  Intervention: Provide Person-Centered Care  Recent Flowsheet Documentation  Taken 05/08/2024 1912 by Gwenn Lenz, RN  Trust Relationship/Rapport: care explained  Goal: Rounds/Family Conference  Outcome: Ongoing (see interventions/notes)     Problem: Diabetes  Goal: Optimal Coping  Outcome: Ongoing (see interventions/notes)  Intervention: Support Wellbeing and Self-Management Success  Recent Flowsheet Documentation  Taken 05/08/2024 1912 by Gwenn Lenz, RN  Family/Support System Care: involvement promoted  Goal: Optimal Functional Ability  Outcome: Ongoing (see interventions/notes)  Intervention: Optimize Functional Ability  Recent Flowsheet Documentation  Taken 05/08/2024 1912 by Gwenn Lenz, RN  Self-Care Promotion: independence encouraged  Activity Management: ROM, active encouraged  Activity Assistance Provided: assistance, stand-by  Goal: Blood Glucose Level Within Target Range  Outcome: Ongoing (see interventions/notes)  Intervention: Optimize Glycemic Control  Recent Flowsheet Documentation  Taken 05/08/2024 1912 by Gwenn Lenz, RN  Hyperglycemia Management: insulin  dose adjusted  Goal: Minimize Hypoglycemia Risk  Outcome: Ongoing (see interventions/notes)  Intervention: Minimize and Manage Hypoglycemia  Recent Flowsheet Documentation  Taken 05/08/2024 1912 by Gwenn Lenz, RN  Hypoglycemia Management: blood glucose monitored     Problem: Skin Injury Risk Increased  Goal: Skin Health and Integrity  Outcome: Ongoing (see interventions/notes)  Intervention: Optimize Skin Protection  Recent Flowsheet Documentation  Taken 05/08/2024 2300 by Gwenn Lenz, RN  Pressure Reduction Techniques:   Heels elevated off of the bed   Moisture, shear and nutrition are maximized   Frequent weight shifting encouraged  Pressure Reduction Devices:   Heel offloading device utilized   Repositioning wedges/pillows utilized   Pressure reduction chair cushion utilized    Pressure redistributing mattress utilized  Skin Protection: adhesive use limited  Taken 05/08/2024 1912 by Gwenn Lenz, RN  Skin Protection: adhesive use limited  Activity Management: ROM, active encouraged  Head of Bed (HOB) Positioning: HOB elevated

## 2024-05-09 NOTE — Progress Notes (Signed)
 [facility]  Hospitalist Progress Note  FULL CODE: ATTEMPT RESUSCITATION/CPR    Kristy Santiago  Date of service: 05/09/2024  Date of Admission:  05/07/2024  Hospital Day:  LOS: 2 days     Subjective/Interval Events: 33 y.o. female with past medical history of type 2 diabetes mellitus on insulin  for 9 years and follows with Endocrinology, coronary artery disease status post MI and stent deployment last year at [facility], long QT syndrome, hypertension, hyperlipidemia, obesity. Patient admitted to [facility] yesterday with concern for DKA. Appears resolving with improvement in bicarb and resolution of anion gap. Patient remains with nausea and some mild epigastric pain. Lab data significant for elevated LFT's. CT abd/pelvis showed normal study. Plan to check MRI of abdomen/MRCP to evaluate liver and bile ducts given persistent mild elevation of LFTs and pancreatic enzymes.  MRI abdomen did not show any abnormalities.  Plan to repeat CMP today to re-evaluate her liver enzymes and to check potassium which was low yesterday.  Patient is still requiring 5 units per hour on the insulin  drip.  Feeling less nauseated today so we will try to advance her diet from clear liquids to 1500 calorie ADA.  Decrease her IV fluids down to 30 cc eventually.  Try to reduce her insulin  drip requirements.  Have asked family to bring her U 500 insulin  to the hospital so that she can dose before discharge home which will probably be tomorrow    Vital Signs:  Temp (24hrs) Max:36.9 C (98.4 F)      Systolic (24hrs), Avg:127 , Min:108 , Max:143     Diastolic (24hrs), Avg:83, Min:71, Max:95    Temp  Avg: 36.6 C (97.9 F)  Min: 36.5 C (97.7 F)  Max: 36.9 C (98.4 F)  MAP (Non-Invasive)  Avg: 93 mmHG  Min: 85 mmHG  Max: 102 mmHG  Pulse  Avg: 86.5  Min: 80  Max: 95  Resp  Avg: 19.3  Min: 17  Max: 22  SpO2  Avg: 96.8 %  Min: 93 %  Max: 99 %       I/O:  I/O last 24 hours:    Intake/Output Summary (Last 24 hours) at 05/09/2024 0921  Last data filed at  05/09/2024 0817  Gross per 24 hour   Intake 2221.7 ml   Output 1900 ml   Net 321.7 ml     I/O current shift:  06/14 0700 - 06/14 1859  In: 360 [P.O.:360]  Out: -       aluminum -magnesium hydroxide-simethicone  (MAG-AL PLUS) 200-200-20 mg per 5 mL oral liquid, 30 mL, Oral, Q4H PRN  aspirin  (ECOTRIN) enteric coated tablet 81 mg, 81 mg, Oral, Daily  atorvastatin  (LIPITOR) tablet, 80 mg, Oral, QPM  baclofen  (LIORESAL ) tablet, 20 mg, Oral, 3x/day  carvedilol  (COREG ) tablet, 25 mg, Oral, 2x/day-Food  chlorhexidine  gluconate (PERIDEX ) 0.12% mouthwash, 15 mL, Swish & Spit, 2x/day  clonazePAM  (klonoPIN ) tablet, 1 mg, Oral, 3x/day  clopidogrel  (PLAVIX ) 75 mg tablet, 75 mg, Oral, Daily  D5W 1/2 NS 1000 mL with potassium chloride  40 mEq premix infusion, , Intravenous, Continuous  dextrose  50% (0.5 g/mL) injection - syringe, 12.5 g, Intravenous, Q30 Min PRN  enoxaparin  PF (LOVENOX ) 60 mg/0.6 mL SubQ injection, 40 mg, Subcutaneous, Q24H  ezetimibe  (ZETIA ) tablet, 10 mg, Oral, QPM  fenofibrate  (TRICOR ) 145 mg tablet, 145 mg, Oral, Daily with Breakfast  hydrOXYzine  pamoate (VISTARIL ) capsule, 100 mg, Oral, NIGHTLY  insulin  glargine-yfgn 100 Units/mL SubQ pen, 10 Units, Subcutaneous, Once  insulin  regular human (MYXREDLIN ) 100 units in  NS 100mL infusion - ADULT DKA, 0.1 Units/kg/hr, Intravenous, Continuous  isosorbide  mononitrate (IMDUR ) 24 hr extended release tablet, 30 mg, Oral, QAM  nitroGLYCERIN  (NITROSTAT ) sublingual tablet, 0.4 mg, Sublingual, Q5 Min PRN  nortriptyline  (PAMELOR ) capsule, 25 mg, Oral, NIGHTLY  NS 250 mL flush bag, , Intravenous, Q15 Min PRN  NS flush syringe, 10 mL, Intracatheter, Q8HRS  NS flush syringe, 10 mL, Intracatheter, Q1H PRN  pantoprazole  (PROTONIX ) delayed release tablet, 40 mg, Oral, 2x/day AC  promethazine  (PHENERGAN ) tablet, 25 mg, Oral, Q6H PRN  risperiDONE  (risperDAL ) tablet, 1 mg, Oral, 2x/day        Physical Exam:    Constitutional: Patient appears stated age, sitting in bed, appears  uncomfortable however in no acute distress   Eyes:  Conjunctiva clear, Pupils equal and round.   Neck:  Supple, symmetrical, trachea midline.  Respiratory:  No audible wheezing or crackles   Cardiovascular:  Regular rate and rhythm, no murmurs.  Gastrointestinal: Abdomen soft, mild tenderness epigastric region.   Musculoskeletal:  Moves all 4 limbs  Integumentary:  Skin warm and dry, No rashes or lesions.  Neurologic:  CN II - XII grossly intact, No tremor.   Extremities: No significant BLE edema     Labs:    CBC (Last 24 Hours):    No results for input(s): WBC, HGB, HCT, MCV, PLTCNT in the last 24 hours.          No results for input(s): MAGNESIUM, PHOSPHORUS in the last 24 hours.    No results for input(s): INR, APTT in the last 24 hours.  No results found for: UHCEASTTROPI      Imaging:    Results for orders placed or performed during the hospital encounter of 05/07/24 (from the past 24 hours)   MRI ABDOMEN (LIVER,PANCREAS) WO CON     Status: None    Narrative    Kristy Santiago    MRI ABDOMEN (LIVER,PANCREAS,MRCP) WO CON performed on 05/08/2024 10:59 AM.    REASON FOR EXAM:  Elevated liver enzymes, pancreatic enzymes, N/V and epigastric pain, evaluate bile ducts      TECHNIQUE: MRI was performed on the Siemens 3.0 T high field magnet.      COMPARISON: None    FINDINGS:     LUNG BASES: The lung bases are unremarkable.  LIVER: Fatty liver without any focal lesion.  SPLEEN: Unremarkable.  ADRENALS: Normal.  PANCREAS: No significant abnormalities.  BILIARY SYSTEM/GALLBLADDER: The gallbladder shows no significant abnormality.  KIDNEY/URETERS: Unremarkable.  RETROPERITONEUM: No significant lymphadenopathy.  AORTA: No evidence of aortic aneurysm.  BONES/SOFT TISSUES: The visualized bony structures are unremarkable.  The MRCP images are limited due to technical factors. However no gross abnormalities seen in the visualized common duct.        Impression    Fatty liver without a focal  lesion.                      Radiologist location ID: [facility]           Microbiology:  No results found for any visits on 05/07/24 (from the past 96 hours).          Assessment/ Plan:     Active Hospital Problems    Diagnosis    Primary Problem: DKA (diabetic ketoacidosis)    Nausea and vomiting    Elevated LFTs       DKA: Appears resolving, bicarb and anion gap have returned to normal range.  Remains on insulin  drip at  5-6 units/hour.  Blood sugar still over 200.  Patient less nauseated and we will try to advance her diet from clears to 1500 calorie ADA.  Decrease IV fluids and try to wean insulin  drip.  Ask patient to have family bring her U 500 insulin  to be given prior to anticipated discharge home tomorrow  Nausea/Vomiting: Persistent symptoms over the past 1 week. Remains with Phenergan  prn for N/V. Empiric GI prophylaxis with IV PPI. CT imaging of abd/pelvis unremarkable per Radiology read. Plan to check MRI of abdomen was unremarkable.  Patient less nauseated and we will try to advance to 1500 calorie diet.   Elevated LFT's: Elevation in AST/ALT, Alk phos and mild elevation in Lipase. Dyspepsia symptoms with N/V and epigastric pain over the past 7-10 days. CT imaging of abd/pelvis report reviewed, plan to check MRI imaging to evaluate liver, pancreas and ducts to exclude obstruction.  MRI was unremarkable.  Need to repeat LFTs today.  Hypokalemia:  Potassium yesterday 3.4.  We will check today and replace if needed      Chronic medical conditions:   1. Coronary artery disease: Patient has had MI and stent placement within a year.  Initial troponin was 6.1  2. Hypertension: We will continue her current meds as tolerated   3. Type 2 diabetes mellitus: Patient follows with Endocrinology and she is on a schedule of U 500 and Humalog.  She is not on any long-acting insulin .  She takes 130 units of U 500 every morning, 105-130 units at 2:00 p.m., and 105-130 units at 9:00 p.m.Aaron Aas  She utilizes Humalog up to 60  units per day depending on elevated sugars not responding to U 500.  4.  Hyperlipidemia:  We will continue her current meds.  5.  Tobacco abuse: We will apply Nicoderm patch      DVT/PE Prophylaxis: Enoxaparin   Diet: DIET DIABETIC Carb Amount: CCD3 = 45 Grams    Angelia Kelp, DO   Hospitalist, [facility]

## 2024-05-09 NOTE — Care Plan (Signed)
 Medications given without complication, insulin  and fluids titrated per physician order, tolerated well.  Patient rested throughout the day. Safety measures in place. Call bell in reach.     Problem: Adult Inpatient Plan of Care  Goal: Plan of Care Review  Outcome: Ongoing (see interventions/notes)  Goal: Patient-Specific Goal (Individualized)  Outcome: Ongoing (see interventions/notes)  Goal: Absence of Hospital-Acquired Illness or Injury  Outcome: Ongoing (see interventions/notes)  Intervention: Identify and Manage Fall Risk  Recent Flowsheet Documentation  Taken 05/09/2024 0700 by Ruhi Kopke C, RN  Safety Promotion/Fall Prevention:   activity supervised   fall prevention program maintained   nonskid shoes/slippers when out of bed   safety round/check completed  Intervention: Prevent Skin Injury  Recent Flowsheet Documentation  Taken 05/09/2024 1100 by Tylynn Braniff C, RN  Skin Protection: adhesive use limited  Taken 05/09/2024 0700 by Tysheka Fanguy C, RN  Skin Protection: adhesive use limited  Intervention: Prevent and Manage VTE (Venous Thromboembolism) Risk  Recent Flowsheet Documentation  Taken 05/09/2024 0700 by Mellany Dinsmore C, RN  VTE Prevention/Management: ambulation promoted  Intervention: Prevent Infection  Recent Flowsheet Documentation  Taken 05/09/2024 0700 by Papa Piercefield C, RN  Infection Prevention:   barrier precautions utilized   promote handwashing   rest/sleep promoted   single patient room provided  Goal: Optimal Comfort and Wellbeing  Outcome: Ongoing (see interventions/notes)  Goal: Rounds/Family Conference  Outcome: Ongoing (see interventions/notes)     Problem: Diabetes  Goal: Optimal Coping  Outcome: Ongoing (see interventions/notes)  Goal: Optimal Functional Ability  Outcome: Ongoing (see interventions/notes)  Goal: Blood Glucose Level Within Target Range  Outcome: Ongoing (see interventions/notes)  Intervention: Optimize Glycemic Control  Recent Flowsheet Documentation  Taken 05/09/2024 0700 by Jemina Scahill C, RN  Hyperglycemia  Management:   insulin  dose adjusted   blood glucose monitored  Goal: Minimize Hypoglycemia Risk  Outcome: Ongoing (see interventions/notes)     Problem: Skin Injury Risk Increased  Goal: Skin Health and Integrity  Outcome: Ongoing (see interventions/notes)  Intervention: Optimize Skin Protection  Recent Flowsheet Documentation  Taken 05/09/2024 1100 by Khadeja Abt C, RN  Pressure Reduction Techniques:   Mobility is maximized   Moisture, shear and nutrition are maximized   Frequent weight shifting encouraged  Pressure Reduction Devices: Pressure redistributing mattress utilized  Skin Protection: adhesive use limited  Taken 05/09/2024 0700 by Julius Matus C, RN  Pressure Reduction Techniques:   Mobility is maximized   Moisture, shear and nutrition are maximized   Frequent weight shifting encouraged  Pressure Reduction Devices: Pressure redistributing mattress utilized  Skin Protection: adhesive use limited

## 2024-05-09 NOTE — Nurses Notes (Signed)
 Physician notified of c-diff positive result.  New orders placed. Patient aware of test results and educated on precautions.

## 2024-05-10 LAB — POC BLOOD GLUCOSE (RESULTS)
GLUCOSE, POC: 143 mg/dL — ABNORMAL HIGH (ref 70–99)
GLUCOSE, POC: 158 mg/dL — ABNORMAL HIGH (ref 70–99)
GLUCOSE, POC: 162 mg/dL — ABNORMAL HIGH (ref 70–99)
GLUCOSE, POC: 175 mg/dL — ABNORMAL HIGH (ref 70–99)
GLUCOSE, POC: 177 mg/dL — ABNORMAL HIGH (ref 70–99)
GLUCOSE, POC: 197 mg/dL — ABNORMAL HIGH (ref 70–99)
GLUCOSE, POC: 197 mg/dL — ABNORMAL HIGH (ref 70–99)
GLUCOSE, POC: 198 mg/dL — ABNORMAL HIGH (ref 70–99)
GLUCOSE, POC: 202 mg/dL — ABNORMAL HIGH (ref 70–99)
GLUCOSE, POC: 212 mg/dL — ABNORMAL HIGH (ref 70–99)
GLUCOSE, POC: 213 mg/dL — ABNORMAL HIGH (ref 70–99)
GLUCOSE, POC: 220 mg/dL — ABNORMAL HIGH (ref 70–99)
GLUCOSE, POC: 223 mg/dL — ABNORMAL HIGH (ref 70–99)
GLUCOSE, POC: 229 mg/dL — ABNORMAL HIGH (ref 70–99)
GLUCOSE, POC: 233 mg/dL — ABNORMAL HIGH (ref 70–99)
GLUCOSE, POC: 236 mg/dL — ABNORMAL HIGH (ref 70–99)
GLUCOSE, POC: 246 mg/dL — ABNORMAL HIGH (ref 70–99)
GLUCOSE, POC: 259 mg/dL — ABNORMAL HIGH (ref 70–99)
GLUCOSE, POC: 270 mg/dL — ABNORMAL HIGH (ref 70–99)
GLUCOSE, POC: 271 mg/dL — ABNORMAL HIGH (ref 70–99)
GLUCOSE, POC: 271 mg/dL — ABNORMAL HIGH (ref 70–99)
GLUCOSE, POC: 272 mg/dL — ABNORMAL HIGH (ref 70–99)
GLUCOSE, POC: 288 mg/dL — ABNORMAL HIGH (ref 70–99)
GLUCOSE, POC: 299 mg/dL — ABNORMAL HIGH (ref 70–99)

## 2024-05-10 MED ORDER — INSULIN REGULAR 100 UNIT/100 ML (1 UNIT/ML) IN 0.9 % NACL IV SOLUTION
0.0000 [IU]/h | INTRAVENOUS | Status: DC
Start: 2024-05-10 — End: 2024-05-10
  Administered 2024-05-10: 0 [IU]/h via INTRAVENOUS
  Administered 2024-05-10: 6 [IU]/h via INTRAVENOUS
  Administered 2024-05-10: 5 [IU]/h via INTRAVENOUS
  Administered 2024-05-10: 6 [IU]/h via INTRAVENOUS
  Administered 2024-05-10: 4 [IU]/h via INTRAVENOUS
  Administered 2024-05-10: 5 [IU]/h via INTRAVENOUS

## 2024-05-10 MED ORDER — INSULIN REGULAR HUMAN U-500 (CONCENTRATE) 500 UNIT/ML(3 ML) SUBCUT PEN
105.0000 [IU] | PEN_INJECTOR | Freq: Once | SUBCUTANEOUS | Status: AC
Start: 2024-05-10 — End: 2024-05-10
  Administered 2024-05-10: 105 [IU] via SUBCUTANEOUS
  Filled 2024-05-10: qty 3

## 2024-05-10 MED ORDER — INSULIN REGULAR HUMAN U-500 (CONCENTRATE) 500 UNIT/ML(3 ML) SUBCUT PEN
100.0000 [IU] | PEN_INJECTOR | SUBCUTANEOUS | Status: AC
Start: 2024-05-10 — End: 2024-05-10
  Administered 2024-05-10: 100 [IU] via SUBCUTANEOUS

## 2024-05-10 MED ORDER — INSULIN REGULAR 100 UNIT/ML SUB-Q SSIP VIAL
4.0000 [IU] | INJECTION | Freq: Four times a day (QID) | SUBCUTANEOUS | Status: DC
Start: 2024-05-10 — End: 2024-05-10

## 2024-05-10 MED ORDER — GLUCAGON 1 MG SOLUTION FOR INJECTION
1.0000 mg | Freq: Once | INTRAMUSCULAR | Status: DC | PRN
Start: 2024-05-10 — End: 2024-05-10

## 2024-05-10 MED ORDER — ACETAMINOPHEN 325 MG TABLET
650.0000 mg | ORAL_TABLET | ORAL | Status: DC | PRN
Start: 2024-05-10 — End: 2024-05-12
  Administered 2024-05-10: 650 mg via ORAL
  Filled 2024-05-10: qty 2

## 2024-05-10 MED ORDER — INSULIN GLARGINE-YFGN (U-100) 100 UNIT/ML (3 ML) SUBCUTANEOUS PEN
30.0000 [IU] | PEN_INJECTOR | Freq: Every day | SUBCUTANEOUS | Status: DC
Start: 2024-05-10 — End: 2024-05-11
  Administered 2024-05-10: 20 [IU] via SUBCUTANEOUS
  Administered 2024-05-10: 30 [IU] via SUBCUTANEOUS
  Administered 2024-05-11: 0 [IU] via SUBCUTANEOUS
  Administered 2024-05-11: 30 [IU] via SUBCUTANEOUS
  Filled 2024-05-10: qty 3

## 2024-05-10 MED ORDER — DEXTROSE 10 % IN WATER (D10W) BOLUS
250.0000 mL | INJECTION | INTRAVENOUS | Status: DC | PRN
Start: 2024-05-10 — End: 2024-05-10

## 2024-05-10 MED ORDER — DEXTROSE 40 % ORAL GEL
15.0000 g | ORAL | Status: DC | PRN
Start: 2024-05-10 — End: 2024-05-10

## 2024-05-10 MED ORDER — INSULIN GLARGINE-YFGN (U-100) 100 UNIT/ML (3 ML) SUBCUTANEOUS PEN
20.0000 [IU] | PEN_INJECTOR | SUBCUTANEOUS | Status: AC
Start: 2024-05-10 — End: 2024-05-10
  Administered 2024-05-10: 20 [IU] via SUBCUTANEOUS

## 2024-05-10 NOTE — Care Plan (Signed)
 Pt's insulin  was regulated as needed. Pt tolerated the changes well. No adverse effect. Pt had an uneventful night. No concerns or complaints voiced at this time. Safety precautions maintained. Call bell within reach. Will continue to monitor.  Problem: Adult Inpatient Plan of Care  Goal: Plan of Care Review  Outcome: Ongoing (see interventions/notes)  Goal: Patient-Specific Goal (Individualized)  Outcome: Ongoing (see interventions/notes)  Goal: Absence of Hospital-Acquired Illness or Injury  Outcome: Ongoing (see interventions/notes)  Intervention: Identify and Manage Fall Risk  Recent Flowsheet Documentation  Taken 05/10/2024 0503 by Gwenn Lenz, RN  Safety Promotion/Fall Prevention:   activity supervised   fall prevention program maintained   motion sensor pad activated   nonskid shoes/slippers when out of bed   safety round/check completed  Taken 05/10/2024 0308 by Gwenn Lenz, RN  Safety Promotion/Fall Prevention:   activity supervised   fall prevention program maintained   motion sensor pad activated   nonskid shoes/slippers when out of bed   safety round/check completed  Taken 05/10/2024 0100 by Gwenn Lenz, RN  Safety Promotion/Fall Prevention:   activity supervised   fall prevention program maintained   motion sensor pad activated   nonskid shoes/slippers when out of bed   safety round/check completed  Taken 05/09/2024 2310 by Gwenn Lenz, RN  Safety Promotion/Fall Prevention:   activity supervised   fall prevention program maintained   motion sensor pad activated   nonskid shoes/slippers when out of bed   safety round/check completed  Taken 05/09/2024 2105 by Gwenn Lenz, RN  Safety Promotion/Fall Prevention:   activity supervised   fall prevention program maintained   motion sensor pad activated   nonskid shoes/slippers when out of bed   safety round/check completed  Taken 05/09/2024 1900 by Gwenn Lenz, RN  Safety Promotion/Fall Prevention:   activity supervised   fall prevention program maintained   motion sensor pad  activated   nonskid shoes/slippers when out of bed   safety round/check completed  Intervention: Prevent Skin Injury  Recent Flowsheet Documentation  Taken 05/09/2024 2300 by Gwenn Lenz, RN  Skin Protection: adhesive use limited  Taken 05/09/2024 1900 by Gwenn Lenz, RN  Body Position: supine  Skin Protection: adhesive use limited  Intervention: Prevent and Manage VTE (Venous Thromboembolism) Risk  Recent Flowsheet Documentation  Taken 05/10/2024 0308 by Gwenn Lenz, RN  VTE Prevention/Management:   ambulation promoted   anticoagulant therapy maintained  Taken 05/09/2024 2310 by Gwenn Lenz, RN  VTE Prevention/Management:   ambulation promoted   anticoagulant therapy maintained  Taken 05/09/2024 1900 by Gwenn Lenz, RN  VTE Prevention/Management:   ambulation promoted   anticoagulant therapy maintained  Intervention: Prevent Infection  Recent Flowsheet Documentation  Taken 05/10/2024 0503 by Gwenn Lenz, RN  Infection Prevention:   barrier precautions utilized   rest/sleep promoted   personal protective equipment utilized   glycemic control managed   promote handwashing  Taken 05/10/2024 0308 by Gwenn Lenz, RN  Infection Prevention:   barrier precautions utilized   glycemic control managed   personal protective equipment utilized   promote handwashing   rest/sleep promoted  Taken 05/10/2024 0100 by Gwenn Lenz, RN  Infection Prevention:   barrier precautions utilized   glycemic control managed   personal protective equipment utilized   promote handwashing   rest/sleep promoted  Taken 05/09/2024 2310 by Gwenn Lenz, RN  Infection Prevention:   barrier precautions utilized   glycemic control managed   personal protective equipment utilized   promote handwashing   rest/sleep promoted  Taken 05/09/2024 2105 by Gwenn Lenz, RN  Infection Prevention:   barrier precautions utilized   glycemic control managed   personal protective equipment utilized   promote handwashing   rest/sleep promoted  Taken 05/09/2024 1900 by Gwenn Lenz, RN  Infection Prevention:    barrier precautions utilized   glycemic control managed   promote handwashing   rest/sleep promoted   personal protective equipment utilized  Goal: Optimal Comfort and Wellbeing  Outcome: Ongoing (see interventions/notes)  Intervention: Provide Person-Centered Care  Recent Flowsheet Documentation  Taken 05/09/2024 1900 by Gwenn Lenz, RN  Trust Relationship/Rapport: care explained  Goal: Rounds/Family Conference  Outcome: Ongoing (see interventions/notes)     Problem: Diabetes  Goal: Optimal Coping  Outcome: Ongoing (see interventions/notes)  Intervention: Support Wellbeing and Self-Management Success  Recent Flowsheet Documentation  Taken 05/09/2024 1900 by Gwenn Lenz, RN  Family/Support System Care: involvement promoted  Goal: Optimal Functional Ability  Outcome: Ongoing (see interventions/notes)  Intervention: Optimize Functional Ability  Recent Flowsheet Documentation  Taken 05/09/2024 1900 by Gwenn Lenz, RN  Self-Care Promotion: independence encouraged  Activity Management: ROM, active encouraged  Activity Assistance Provided: assistance, stand-by  Goal: Blood Glucose Level Within Target Range  Outcome: Ongoing (see interventions/notes)  Intervention: Optimize Glycemic Control  Recent Flowsheet Documentation  Taken 05/09/2024 1900 by Gwenn Lenz, RN  Hyperglycemia Management: blood glucose monitored  Goal: Minimize Hypoglycemia Risk  Outcome: Ongoing (see interventions/notes)  Intervention: Minimize and Manage Hypoglycemia  Recent Flowsheet Documentation  Taken 05/09/2024 1900 by Gwenn Lenz, RN  Hypoglycemia Management: blood glucose monitored     Problem: Skin Injury Risk Increased  Goal: Skin Health and Integrity  Outcome: Ongoing (see interventions/notes)  Intervention: Optimize Skin Protection  Recent Flowsheet Documentation  Taken 05/09/2024 2300 by Gwenn Lenz, RN  Pressure Reduction Techniques:   Heels elevated off of the bed   Moisture, shear and nutrition are maximized   Frequent weight shifting encouraged  Pressure  Reduction Devices:   Heel offloading device utilized   Pressure redistributing mattress utilized   Repositioning wedges/pillows utilized  Skin Protection: adhesive use limited  Taken 05/09/2024 1900 by Gwenn Lenz, RN  Pressure Reduction Techniques:   Heels elevated off of the bed   Mobility is maximized   Moisture, shear and nutrition are maximized   Frequent weight shifting encouraged  Pressure Reduction Devices:   Heel offloading device utilized   Pressure redistributing mattress utilized   Repositioning wedges/pillows utilized  Skin Protection: adhesive use limited  Activity Management: ROM, active encouraged  Head of Bed (HOB) Positioning: HOB elevated

## 2024-05-10 NOTE — Nurses Notes (Signed)
 This nurse notified Dr. Hildy Lowers pt's blood sugar at 1900 was 236 and at 2000 blood sugar 288. Dr. Hildy Lowers stated to give pt 20 units of glargine now and give at 2100 U500 dose the pt gives herself at home. Dr. Hildy Lowers stated to ask pt dose pt takes at home to give her that amount.

## 2024-05-10 NOTE — Nurses Notes (Signed)
Insulin drip discontinued at this time.

## 2024-05-10 NOTE — Nurses Notes (Signed)
 This nurse notified Dr. Hildy Lowers the pt's blood sugar is 143. Dr. Hildy Lowers was pt to be on 4 ml/hr.

## 2024-05-10 NOTE — Nurses Notes (Signed)
 Patient's home insulin  U500 brought in.  Dr. Hildy Lowers notified.   New orders placed.  Give 100 units of the U500 with supper  And 20 units of glargine with supper  Wait 30 minutes then stop insulin  drip.   Hourly fingersticks required.   Notify physician if blood sugar shows signs of trending upward.

## 2024-05-10 NOTE — Nurses Notes (Signed)
 Insulin  injections given at this time-see emar

## 2024-05-10 NOTE — Care Plan (Signed)
 Medications given without complication, insulin  drip titrated per physician order.  Patient rested in bed throughout the day. . Safety measures in place. Call bell in reach.     Problem: Adult Inpatient Plan of Care  Goal: Plan of Care Review  Outcome: Ongoing (see interventions/notes)  Goal: Patient-Specific Goal (Individualized)  Outcome: Ongoing (see interventions/notes)  Goal: Absence of Hospital-Acquired Illness or Injury  Outcome: Ongoing (see interventions/notes)  Intervention: Identify and Manage Fall Risk  Recent Flowsheet Documentation  Taken 05/10/2024 1500 by Aldean Hummingbird, RN  Safety Promotion/Fall Prevention:   activity supervised   fall prevention program maintained   nonskid shoes/slippers when out of bed   safety round/check completed  Taken 05/10/2024 1100 by Burk Hoctor C, RN  Safety Promotion/Fall Prevention:   activity supervised   fall prevention program maintained   nonskid shoes/slippers when out of bed   safety round/check completed  Taken 05/10/2024 0700 by Angelena Sand C, RN  Safety Promotion/Fall Prevention:   activity supervised   fall prevention program maintained   nonskid shoes/slippers when out of bed   safety round/check completed  Intervention: Prevent Skin Injury  Recent Flowsheet Documentation  Taken 05/10/2024 1100 by Rand Etchison C, RN  Skin Protection: adhesive use limited  Taken 05/10/2024 1035 by Hayly Litsey C, RN  Skin Protection: adhesive use limited  Taken 05/10/2024 0700 by Nazir Hacker C, RN  Skin Protection: adhesive use limited  Intervention: Prevent and Manage VTE (Venous Thromboembolism) Risk  Recent Flowsheet Documentation  Taken 05/10/2024 1500 by Aldean Hummingbird, RN  VTE Prevention/Management: ambulation promoted  Taken 05/10/2024 1100 by Diasia Henken C, RN  VTE Prevention/Management: ambulation promoted  Taken 05/10/2024 0700 by Stepan Verrette C, RN  VTE Prevention/Management: ambulation promoted  Intervention: Prevent Infection  Recent Flowsheet Documentation  Taken 05/10/2024 0700 by Toshie Demelo C, RN  Infection Prevention: barrier  precautions utilized  Goal: Optimal Comfort and Wellbeing  Outcome: Ongoing (see interventions/notes)  Goal: Rounds/Family Conference  Outcome: Ongoing (see interventions/notes)     Problem: Diabetes  Goal: Optimal Coping  Outcome: Ongoing (see interventions/notes)  Goal: Optimal Functional Ability  Outcome: Ongoing (see interventions/notes)  Goal: Blood Glucose Level Within Target Range  Outcome: Ongoing (see interventions/notes)  Goal: Minimize Hypoglycemia Risk  Outcome: Ongoing (see interventions/notes)     Problem: Skin Injury Risk Increased  Goal: Skin Health and Integrity  Outcome: Ongoing (see interventions/notes)  Intervention: Optimize Skin Protection  Recent Flowsheet Documentation  Taken 05/10/2024 1500 by Aldean Hummingbird, RN  Pressure Reduction Techniques: Heels elevated off of the bed  Pressure Reduction Devices: Heel offloading device utilized  Taken 05/10/2024 1100 by Tarsha Blando C, RN  Pressure Reduction Techniques: Heels elevated off of the bed  Pressure Reduction Devices: Heel offloading device utilized  Skin Protection: adhesive use limited  Taken 05/10/2024 1035 by Mikenna Bunkley C, RN  Pressure Reduction Techniques:   Mobility is maximized   Moisture, shear and nutrition are maximized   Frequent weight shifting encouraged  Pressure Reduction Devices: Pressure redistributing mattress utilized  Skin Protection: adhesive use limited  Taken 05/10/2024 0700 by Dwayn Moravek C, RN  Pressure Reduction Techniques: Heels elevated off of the bed  Pressure Reduction Devices: Heel offloading device utilized  Skin Protection: adhesive use limited

## 2024-05-10 NOTE — Progress Notes (Signed)
 [facility]  Hospitalist Progress Note  FULL CODE: ATTEMPT RESUSCITATION/CPR    Kristy Santiago  Date of service: 05/10/2024  Date of Admission:  05/07/2024  Hospital Day:  LOS: 3 days     Subjective/Interval Events: 33 y.o. female with past medical history of type 2 diabetes mellitus on insulin  for 9 years and follows with Endocrinology, coronary artery disease status post MI and stent deployment last year at [facility], long QT syndrome, hypertension, hyperlipidemia, obesity. Patient admitted to [facility] yesterday with concern for DKA. Appears resolving with improvement in bicarb and resolution of anion gap. Patient remains with nausea and some mild epigastric pain. Lab data significant for elevated LFT's. CT abd/pelvis showed normal study. Plan to check MRI of abdomen/MRCP to evaluate liver and bile ducts given persistent mild elevation of LFTs and pancreatic enzymes.  MRI abdomen did not show any abnormalities.  Repeat CMP showed decreasing liver enzymes and normalization of her potassium. Patient is still requiring 6 units per hour on the insulin  drip.  Tolerating advancing diet without nausea or vomiting.  IV fluids discontinued.  Stool studies returned positive for C diff which probably is what was causing her nausea on admission.  Patient reports taking outpatient antibiotics several weeks ago.  Diarrhea seems to be improving on Flagyl and vancomycin.  Family is bringing in her U500 insulin  for us  to initiate while she is in the hospital.  Also plan to start long-acting glargine 30 units daily.  Once sugars are under better control she can be discharged home    Vital Signs:  Temp (24hrs) Max:36.8 C (98.2 F)      Systolic (24hrs), Avg:137 , Min:127 , Max:149     Diastolic (24hrs), Avg:93, Min:86, Max:98    Temp  Avg: 36.6 C (97.9 F)  Min: 36.4 C (97.6 F)  Max: 36.8 C (98.2 F)  MAP (Non-Invasive)  Avg: 105.6 mmHG  Min: 99 mmHG  Max: 111 mmHG  Pulse  Avg: 88.4  Min: 82  Max: 98  Resp  Avg: 20.1  Min: 14  Max:  27  SpO2  Avg: 94.9 %  Min: 90 %  Max: 98 %       I/O:  I/O last 24 hours:    Intake/Output Summary (Last 24 hours) at 05/10/2024 0918  Last data filed at 05/10/2024 0752  Gross per 24 hour   Intake 1200 ml   Output 650 ml   Net 550 ml     I/O current shift:  06/15 0700 - 06/15 1859  In: 240 [P.O.:240]  Out: -       aluminum -magnesium hydroxide-simethicone  (MAG-AL PLUS) 200-200-20 mg per 5 mL oral liquid, 30 mL, Oral, Q4H PRN  aspirin  (ECOTRIN) enteric coated tablet 81 mg, 81 mg, Oral, Daily  atorvastatin  (LIPITOR) tablet, 80 mg, Oral, QPM  baclofen  (LIORESAL ) tablet, 20 mg, Oral, 3x/day  carvedilol  (COREG ) tablet, 25 mg, Oral, 2x/day-Food  chlorhexidine  gluconate (PERIDEX ) 0.12% mouthwash, 15 mL, Swish & Spit, 2x/day  clonazePAM  (klonoPIN ) tablet, 1 mg, Oral, 3x/day  clopidogrel  (PLAVIX ) 75 mg tablet, 75 mg, Oral, Daily  dextrose  50% (0.5 g/mL) injection - syringe, 12.5 g, Intravenous, Q30 Min PRN  enoxaparin  PF (LOVENOX ) 60 mg/0.6 mL SubQ injection, 40 mg, Subcutaneous, Q24H  ezetimibe  (ZETIA ) tablet, 10 mg, Oral, QPM  fenofibrate  (TRICOR ) 145 mg tablet, 145 mg, Oral, Daily with Breakfast  hydrOXYzine  pamoate (VISTARIL ) capsule, 100 mg, Oral, NIGHTLY  insulin  glargine-yfgn 100 Units/mL SubQ pen, 10 Units, Subcutaneous, Once  insulin  regular human (MYXREDLIN ) 100 units  in NS infusion - ADULT DKA, 0.1 Units/kg/hr, Intravenous, Continuous  isosorbide  mononitrate (IMDUR ) 24 hr extended release tablet, 30 mg, Oral, QAM  metroNIDAZOLE (FLAGYL) tablet, 250 mg, Oral, 3x/day  nitroGLYCERIN  (NITROSTAT ) sublingual tablet, 0.4 mg, Sublingual, Q5 Min PRN  nortriptyline  (PAMELOR ) capsule, 25 mg, Oral, NIGHTLY  NS 250 mL flush bag, , Intravenous, Q15 Min PRN  NS flush syringe, 10 mL, Intracatheter, Q8HRS  NS flush syringe, 10 mL, Intracatheter, Q1H PRN  pantoprazole  (PROTONIX ) delayed release tablet, 40 mg, Oral, 2x/day AC  promethazine  (PHENERGAN ) tablet, 25 mg, Oral, Q6H PRN  risperiDONE  (risperDAL ) tablet, 1 mg, Oral,  2x/day  vancomycin (VANCOCIN) capsule, 250 mg, Oral, 4x/day        Physical Exam:    Constitutional: Patient appears stated age, sitting in bed, appears uncomfortable however in no acute distress   Eyes:  Conjunctiva clear, Pupils equal and round.   Neck:  Supple, symmetrical, trachea midline.  Respiratory:  No audible wheezing or crackles   Cardiovascular:  Regular rate and rhythm, no murmurs.  Gastrointestinal: Abdomen soft, mild tenderness epigastric region.   Musculoskeletal:  Moves all 4 limbs  Integumentary:  Skin warm and dry, No rashes or lesions.  Neurologic:  CN II - XII grossly intact, No tremor.   Extremities: No significant BLE edema     Labs:    CBC (Last 24 Hours):    No results for input(s): WBC, HGB, HCT, MCV, PLTCNT in the last 24 hours.      Last BMP  (Last result in the past 24 hours)        Na   K   Cl   CO2   BUN   Cr   Calcium   Glucose   Glucose-Fasting        05/09/24 0949 137   4.5   110   21   3   0.70   8.7  Comment: Gadolinium-containing contrast can interfere with calcium measurement.     263               Last Hepatic Panel  (Last result in the past 24 hours)        Albumin   Total PTN   Total Bili   Direct Bili   Ast/SGOT   Alt/SGPT   Alk Phos        05/09/24 0949 2.8   6.3   0.3  Comment: Naproxen therapy can falsely elevate total bilirubin levels.     49   51   126               No results for input(s): MAGNESIUM, PHOSPHORUS in the last 24 hours.    No results for input(s): INR, APTT in the last 24 hours.  No results found for: UHCEASTTROPI      Imaging:             Microbiology:  Hospital Encounter on 05/07/24 (from the past 96 hours)   C.DIFF TOXIN DNA    Collection Time: 05/09/24  8:54 AM    Specimen: Stool   Culture Result Status    C.DIFF TOXIN DNA Positive (A) Final    Narrative    RESULTS ARE FOR CLOSTRIDIUM DIFFICILE TARGET DNA             Assessment/ Plan:     Active Hospital Problems    Diagnosis    Primary Problem: DKA (diabetic ketoacidosis)    Nausea  and vomiting    Elevated LFTs  DKA: Appears resolving, bicarb and anion gap have returned to normal range.  Remains on insulin  drip at 5-6 units/hour.  Blood sugar still over 200.  Tested positive for C diff yesterday which is probably keeping her sugars elevated.  She is tolerating a diabetic diet without nausea or vomiting.  Plan to discontinue IV fluids.  Family bringing in her U500 so we can initiate while she is hospitalized.  Also plan to add long-acting glargine 30 units daily.  Would like to see her sugars under much better control while we transitioned to subcutaneous insulin  prior to discharge home  Nausea/Vomiting: Persistent symptoms over the past 1 week. Remains with Phenergan  prn for N/V. Empiric GI prophylaxis with IV PPI. CT imaging of abd/pelvis unremarkable per Radiology read. Plan to check MRI of abdomen was unremarkable.  C diff positive.  Since treatment started her nausea and vomiting have subsided  Elevated LFT's: Elevation in AST/ALT, Alk phos and mild elevation in Lipase. Dyspepsia symptoms with N/V and epigastric pain over the past 7-10 days. CT imaging of abd/pelvis report reviewed, plan to check MRI imaging to evaluate liver, pancreas and ducts to exclude obstruction.  MRI was unremarkable.  LFTs are improved  Hypokalemia:  Normalization of potassium with replacement     Chronic medical conditions:   1. Coronary artery disease: Patient has had MI and stent placement within a year.  Initial troponin was 6.1  2. Hypertension: We will continue her current meds as tolerated   3. Type 2 diabetes mellitus: Patient follows with Endocrinology and she is on a schedule of U 500 and Humalog.  She is not on any long-acting insulin .  She takes 130 units of U 500 every morning, 105-130 units at 2:00 p.m., and 105-130 units at 9:00 p.m.Aaron Aas  She utilizes Humalog up to 60 units per day depending on elevated sugars not responding to U 500.  4.  Hyperlipidemia:  We will continue her current meds.  5.   Tobacco abuse: We will apply Nicoderm patch      DVT/PE Prophylaxis: Enoxaparin   Diet: DIET DIABETIC Carb Amount: CCD3 = 45 Grams    Angelia Kelp, DO   Hospitalist, [facility]

## 2024-05-11 ENCOUNTER — Other Ambulatory Visit: Payer: Self-pay

## 2024-05-11 ENCOUNTER — Inpatient Hospital Stay (HOSPITAL_COMMUNITY)

## 2024-05-11 LAB — BASIC METABOLIC PANEL
ANION GAP: 9 mmol/L (ref 4–13)
BUN/CREA RATIO: 8 (ref 6–22)
BUN: 6 mg/dL — ABNORMAL LOW (ref 8–25)
CALCIUM: 9.2 mg/dL (ref 8.6–10.2)
CHLORIDE: 108 mmol/L (ref 96–111)
CO2 TOTAL: 21 mmol/L — ABNORMAL LOW (ref 22–30)
CREATININE: 0.8 mg/dL (ref 0.60–1.05)
ESTIMATED GFR - FEMALE: >90 mL/min/BSA (ref >=60)
GLUCOSE: 308 mg/dL — ABNORMAL HIGH (ref 65–125)
POTASSIUM: 4 mmol/L (ref 3.5–5.1)
SODIUM: 138 mmol/L (ref 136–145)

## 2024-05-11 LAB — POC BLOOD GLUCOSE (RESULTS)
GLUCOSE, POC: 248 mg/dL — ABNORMAL HIGH (ref 70–99)
GLUCOSE, POC: 255 mg/dL — ABNORMAL HIGH (ref 70–99)
GLUCOSE, POC: 263 mg/dL — ABNORMAL HIGH (ref 70–99)
GLUCOSE, POC: 272 mg/dL — ABNORMAL HIGH (ref 70–99)
GLUCOSE, POC: 275 mg/dL — ABNORMAL HIGH (ref 70–99)
GLUCOSE, POC: 276 mg/dL — ABNORMAL HIGH (ref 70–99)
GLUCOSE, POC: 297 mg/dL — ABNORMAL HIGH (ref 70–99)
GLUCOSE, POC: 300 mg/dL — ABNORMAL HIGH (ref 70–99)
GLUCOSE, POC: 306 mg/dL — ABNORMAL HIGH (ref 70–99)
GLUCOSE, POC: 325 mg/dL — ABNORMAL HIGH (ref 70–99)
GLUCOSE, POC: 333 mg/dL — ABNORMAL HIGH (ref 70–99)

## 2024-05-11 LAB — LAVENDER TOP TUBE

## 2024-05-11 MED ORDER — INSULIN GLARGINE-YFGN (U-100) 100 UNIT/ML (3 ML) SUBCUTANEOUS PEN
50.0000 [IU] | PEN_INJECTOR | Freq: Every day | SUBCUTANEOUS | Status: DC
Start: 2024-05-12 — End: 2024-05-12
  Administered 2024-05-12: 50 [IU] via SUBCUTANEOUS

## 2024-05-11 MED ORDER — INSULIN REGULAR HUMAN U-500 (CONCENTRATE) 500 UNIT/ML(3 ML) SUBCUT PEN
100.0000 [IU] | PEN_INJECTOR | Freq: Once | SUBCUTANEOUS | Status: AC
Start: 2024-05-11 — End: 2024-05-11
  Administered 2024-05-11: 100 [IU] via SUBCUTANEOUS

## 2024-05-11 MED ORDER — DEXTROSE 40 % ORAL GEL
15.0000 g | ORAL | Status: DC | PRN
Start: 2024-05-11 — End: 2024-05-11

## 2024-05-11 MED ORDER — INSULIN REGULAR HUMAN U-500(CONCENTRATE) 500 UNIT/ML SUBCUTANEOUS SOLN
130.0000 [IU] | Freq: Every evening | SUBCUTANEOUS | Status: AC
Start: 2024-05-11 — End: 2024-05-11
  Administered 2024-05-11: 130 [IU] via SUBCUTANEOUS

## 2024-05-11 MED ORDER — INSULIN LISPRO (U-100) 100 UNIT/ML SUBCUTANEOUS PEN
PEN_INJECTOR | SUBCUTANEOUS | Status: AC
Start: 2024-05-11 — End: 2024-05-11
  Administered 2024-05-11: 10 [IU] via SUBCUTANEOUS
  Filled 2024-05-11: qty 300

## 2024-05-11 MED ORDER — GLUCAGON 1 MG SOLUTION FOR INJECTION
1.0000 mg | Freq: Once | INTRAMUSCULAR | Status: DC | PRN
Start: 2024-05-11 — End: 2024-05-11

## 2024-05-11 MED ORDER — LOSARTAN 50 MG TABLET
50.0000 mg | ORAL_TABLET | Freq: Two times a day (BID) | ORAL | Status: DC
Start: 2024-05-11 — End: 2024-05-12
  Administered 2024-05-11 – 2024-05-12 (×3): 50 mg via ORAL
  Filled 2024-05-11 (×2): qty 1

## 2024-05-11 MED ORDER — INSULIN LISPRO (U-100) 100 UNIT/ML SUBCUTANEOUS PEN
10.0000 [IU] | PEN_INJECTOR | SUBCUTANEOUS | Status: AC
Start: 2024-05-11 — End: 2024-05-11

## 2024-05-11 MED ORDER — INSULIN REGULAR 100 UNIT/ML SUB-Q SSIP VIAL
4.0000 [IU] | INJECTION | Freq: Four times a day (QID) | SUBCUTANEOUS | Status: DC
Start: 2024-05-11 — End: 2024-05-11

## 2024-05-11 MED ORDER — INSULIN LISPRO (U-100) 100 UNIT/ML SUBCUTANEOUS PEN
20.0000 [IU] | PEN_INJECTOR | SUBCUTANEOUS | Status: AC
Start: 2024-05-11 — End: 2024-05-11
  Administered 2024-05-11: 20 [IU] via SUBCUTANEOUS

## 2024-05-11 MED ORDER — INSULIN REGULAR HUMAN U-500(CONCENTRATE) 500 UNIT/ML SUBCUTANEOUS SOLN
130.0000 [IU] | Freq: Once | SUBCUTANEOUS | Status: AC
Start: 2024-05-11 — End: 2024-05-11
  Administered 2024-05-11: 130 [IU] via SUBCUTANEOUS

## 2024-05-11 MED ORDER — LOSARTAN 50 MG TABLET
50.0000 mg | ORAL_TABLET | Freq: Every day | ORAL | Status: DC
Start: 2024-05-11 — End: 2024-05-11
  Filled 2024-05-11: qty 1

## 2024-05-11 MED ORDER — INSULIN GLARGINE-YFGN (U-100) 100 UNIT/ML (3 ML) SUBCUTANEOUS PEN
10.0000 [IU] | PEN_INJECTOR | SUBCUTANEOUS | Status: AC
Start: 2024-05-11 — End: 2024-05-11
  Administered 2024-05-11: 10 [IU] via SUBCUTANEOUS

## 2024-05-11 MED ORDER — DEXTROSE 10 % IN WATER (D10W) BOLUS
250.0000 mL | INJECTION | INTRAVENOUS | Status: DC | PRN
Start: 2024-05-11 — End: 2024-05-11

## 2024-05-11 MED ORDER — INSULIN GLARGINE-YFGN (U-100) 100 UNIT/ML (3 ML) SUBCUTANEOUS PEN
20.0000 [IU] | PEN_INJECTOR | Freq: Every evening | SUBCUTANEOUS | Status: DC
Start: 2024-05-11 — End: 2024-05-12
  Administered 2024-05-11: 20 [IU] via SUBCUTANEOUS

## 2024-05-11 NOTE — Nurses Notes (Signed)
 This nurse notified Dr. Hildy Lowers pt's blood sugar was 300 and the second one was 306. Dr. Hildy Lowers stated to give one time dose 100 units of  Humlin R and 30 units of glargine.

## 2024-05-11 NOTE — Care Plan (Signed)
 Pt tolerated medications well. Blood sugars varied due to pt eating two sandwiches, two yogurts, and crackers. Pt's blood sugars trending down. No concerns or complaints voiced at this time. Pt had an uneventful night. Safety precautions maintained. Call bell within reach. Will continue to monitor pt.   Problem: Adult Inpatient Plan of Care  Goal: Plan of Care Review  Outcome: Ongoing (see interventions/notes)  Goal: Patient-Specific Goal (Individualized)  Outcome: Ongoing (see interventions/notes)  Goal: Absence of Hospital-Acquired Illness or Injury  Outcome: Ongoing (see interventions/notes)  Intervention: Identify and Manage Fall Risk  Recent Flowsheet Documentation  Taken 05/11/2024 0502 by Gwenn Lenz, RN  Safety Promotion/Fall Prevention:   activity supervised   fall prevention program maintained   motion sensor pad activated   nonskid shoes/slippers when out of bed   safety round/check completed  Taken 05/11/2024 0303 by Gwenn Lenz, RN  Safety Promotion/Fall Prevention:   activity supervised   fall prevention program maintained   motion sensor pad activated   nonskid shoes/slippers when out of bed   safety round/check completed  Taken 05/11/2024 0101 by Gwenn Lenz, RN  Safety Promotion/Fall Prevention:   activity supervised   fall prevention program maintained   motion sensor pad activated   nonskid shoes/slippers when out of bed   safety round/check completed  Taken 05/10/2024 2300 by Gwenn Lenz, RN  Safety Promotion/Fall Prevention:   activity supervised   fall prevention program maintained   motion sensor pad activated   nonskid shoes/slippers when out of bed   safety round/check completed  Taken 05/10/2024 2114 by Gwenn Lenz, RN  Safety Promotion/Fall Prevention:   activity supervised   fall prevention program maintained   motion sensor pad activated   nonskid shoes/slippers when out of bed   safety round/check completed  Taken 05/10/2024 1900 by Gwenn Lenz, RN  Safety Promotion/Fall Prevention:   activity supervised    fall prevention program maintained   motion sensor pad activated   nonskid shoes/slippers when out of bed   safety round/check completed  Intervention: Prevent Skin Injury  Recent Flowsheet Documentation  Taken 05/10/2024 2300 by Gwenn Lenz, RN  Skin Protection: adhesive use limited  Taken 05/10/2024 1900 by Gwenn Lenz, RN  Body Position: supine  Intervention: Prevent and Manage VTE (Venous Thromboembolism) Risk  Recent Flowsheet Documentation  Taken 05/10/2024 2300 by Gwenn Lenz, RN  VTE Prevention/Management:   ambulation promoted   dorsiflexion/plantar flexion performed  Taken 05/10/2024 1900 by Gwenn Lenz, RN  VTE Prevention/Management:   ambulation promoted   dorsiflexion/plantar flexion performed  Intervention: Prevent Infection  Recent Flowsheet Documentation  Taken 05/11/2024 0502 by Gwenn Lenz, RN  Infection Prevention:   barrier precautions utilized   promote handwashing   personal protective equipment utilized   glycemic control managed   rest/sleep promoted  Taken 05/11/2024 0303 by Gwenn Lenz, RN  Infection Prevention:   barrier precautions utilized   glycemic control managed   personal protective equipment utilized   promote handwashing   rest/sleep promoted  Taken 05/11/2024 0101 by Gwenn Lenz, RN  Infection Prevention:   barrier precautions utilized   glycemic control managed   personal protective equipment utilized   promote handwashing   rest/sleep promoted  Taken 05/10/2024 2300 by Gwenn Lenz, RN  Infection Prevention:   barrier precautions utilized   glycemic control managed   personal protective equipment utilized   promote handwashing   rest/sleep promoted  Taken 05/10/2024 2114 by Gwenn Lenz, RN  Infection Prevention:   barrier precautions utilized  glycemic control managed   personal protective equipment utilized   promote handwashing   rest/sleep promoted  Taken 05/10/2024 1900 by Gwenn Lenz, RN  Infection Prevention:   barrier precautions utilized   promote handwashing   personal protective equipment utilized    glycemic control managed   rest/sleep promoted  Goal: Optimal Comfort and Wellbeing  Outcome: Ongoing (see interventions/notes)  Intervention: Provide Person-Centered Care  Recent Flowsheet Documentation  Taken 05/10/2024 1900 by Gwenn Lenz, RN  Trust Relationship/Rapport: care explained  Goal: Rounds/Family Conference  Outcome: Ongoing (see interventions/notes)     Problem: Diabetes  Goal: Optimal Coping  Outcome: Ongoing (see interventions/notes)  Intervention: Support Wellbeing and Self-Management Success  Recent Flowsheet Documentation  Taken 05/10/2024 1900 by Gwenn Lenz, RN  Family/Support System Care: involvement promoted  Goal: Optimal Functional Ability  Outcome: Ongoing (see interventions/notes)  Intervention: Optimize Functional Ability  Recent Flowsheet Documentation  Taken 05/10/2024 1900 by Gwenn Lenz, RN  Self-Care Promotion: independence encouraged  Activity Assistance Provided: independent  Goal: Blood Glucose Level Within Target Range  Outcome: Ongoing (see interventions/notes)  Intervention: Optimize Glycemic Control  Recent Flowsheet Documentation  Taken 05/10/2024 1900 by Gwenn Lenz, RN  Hyperglycemia Management: blood glucose monitored  Goal: Minimize Hypoglycemia Risk  Outcome: Ongoing (see interventions/notes)  Intervention: Minimize and Manage Hypoglycemia  Recent Flowsheet Documentation  Taken 05/10/2024 1900 by Gwenn Lenz, RN  Hypoglycemia Management: blood glucose monitored     Problem: Skin Injury Risk Increased  Goal: Skin Health and Integrity  Outcome: Ongoing (see interventions/notes)  Intervention: Optimize Skin Protection  Recent Flowsheet Documentation  Taken 05/10/2024 2300 by Gwenn Lenz, RN  Pressure Reduction Techniques:   Heels elevated off of the bed   Moisture, shear and nutrition are maximized   Frequent weight shifting encouraged  Pressure Reduction Devices:   Heel offloading device utilized   Pressure redistributing mattress utilized   Repositioning wedges/pillows utilized  Skin Protection:  adhesive use limited  Taken 05/10/2024 1900 by Gwenn Lenz, RN  Pressure Reduction Techniques:   Heels elevated off of the bed   Mobility is maximized   Moisture, shear and nutrition are maximized   Frequent weight shifting encouraged  Pressure Reduction Devices:   Heel offloading device utilized   Pressure redistributing mattress utilized   Repositioning wedges/pillows utilized  Head of Bed (HOB) Positioning: HOB elevated

## 2024-05-11 NOTE — Nurses Notes (Signed)
 Pt requested a second sandwich, two yogurts, and crackers.

## 2024-05-11 NOTE — Nurses Notes (Signed)
 Called Dr. Hildy Lowers about patient glucose level in the 200's and new orders were given for insulin - see orders.

## 2024-05-11 NOTE — Progress Notes (Signed)
 [facility]  Hospitalist Progress Note  FULL CODE: ATTEMPT RESUSCITATION/CPR    Samyiah Halvorsen  Date of service: 05/11/2024  Date of Admission:  05/07/2024  Hospital Day:  LOS: 4 days     Subjective/Interval Events: 33 y.o. female with past medical history of type 2 diabetes mellitus on insulin  for 9 years and follows with Endocrinology, coronary artery disease status post MI and stent deployment last year at [facility], long QT syndrome, hypertension, hyperlipidemia, obesity. Patient admitted to [facility] yesterday with concern for DKA. Appears resolving with improvement in bicarb and resolution of anion gap. Patient remains with nausea and some mild epigastric pain. Lab data significant for elevated LFT's. CT abd/pelvis showed normal study. Plan to check MRI of abdomen/MRCP to evaluate liver and bile ducts given persistent mild elevation of LFTs and pancreatic enzymes.  MRI abdomen did not show any abnormalities.  Repeat CMP showed decreasing liver enzymes and normalization of her potassium. Patient is still requiring 6 units per hour on the insulin  drip.  Tolerating advancing diet without nausea or vomiting.  IV fluids discontinued.  Stool studies returned positive for C diff which probably is what was causing her nausea on admission.  Patient reports taking outpatient antibiotics several weeks ago.  Diarrhea seems to be improving on Flagyl and vancomycin but remains persistent today.  Family brought in her U500 and we have been using it in addition to glargine.  I suspect that she will need 50 units of glargine every morning and 20 units every evening.  We have also given an additional injection of Humalog for elevated blood sugar in between her U500 readings.  She is off the insulin  drip.  Blood pressure is grossly elevated today.  She reports at home that she has several out of proportion blood pressure readings.  Plan to start Cozaar 50 mg twice daily.  Patient also complaining of increasing brain fog with this admission  and having difficulty texting.  She is a high-risk candidate for stroke therefore I will order CT scan of the head without contrast    Vital Signs:  Temp (24hrs) Max:36.7 C (98 F)      Systolic (24hrs), Avg:145 , Min:114 , Max:154     Diastolic (24hrs), Avg:92, Min:71, Max:101    Temp  Avg: 36.3 C (97.4 F)  Min: 36.1 C (97 F)  Max: 36.7 C (98 F)  MAP (Non-Invasive)  Avg: 106.5 mmHG  Min: 80 mmHG  Max: 115 mmHG  Pulse  Avg: 82.4  Min: 67  Max: 95  Resp  Avg: 17.5  Min: 14  Max: 21  SpO2  Avg: 93.7 %  Min: 91 %  Max: 95 %       I/O:  I/O last 24 hours:    Intake/Output Summary (Last 24 hours) at 05/11/2024 0944  Last data filed at 05/11/2024 0800  Gross per 24 hour   Intake 1080 ml   Output 2 ml   Net 1078 ml     I/O current shift:  06/16 0700 - 06/16 1859  In: 360 [P.O.:360]  Out: -       acetaminophen  (TYLENOL ) tablet, 650 mg, Oral, Q4H PRN  aluminum -magnesium hydroxide-simethicone  (MAG-AL PLUS) 200-200-20 mg per 5 mL oral liquid, 30 mL, Oral, Q4H PRN  aspirin  (ECOTRIN) enteric coated tablet 81 mg, 81 mg, Oral, Daily  atorvastatin  (LIPITOR) tablet, 80 mg, Oral, QPM  baclofen  (LIORESAL ) tablet, 20 mg, Oral, 3x/day  carvedilol  (COREG ) tablet, 25 mg, Oral, 2x/day-Food  chlorhexidine  gluconate (PERIDEX ) 0.12% mouthwash,  15 mL, Swish & Spit, 2x/day  clonazePAM  (klonoPIN ) tablet, 1 mg, Oral, 3x/day  clopidogrel  (PLAVIX ) 75 mg tablet, 75 mg, Oral, Daily  enoxaparin  PF (LOVENOX ) 60 mg/0.6 mL SubQ injection, 40 mg, Subcutaneous, Q24H  ezetimibe  (ZETIA ) tablet, 10 mg, Oral, QPM  fenofibrate  (TRICOR ) 145 mg tablet, 145 mg, Oral, Daily with Breakfast  hydrOXYzine  pamoate (VISTARIL ) capsule, 100 mg, Oral, NIGHTLY  [START ON 05/12/2024] insulin  glargine-yfgn 100 Units/mL SubQ pen, 50 Units, Subcutaneous, Daily  insulin  glargine-yfgn 100 Units/mL SubQ pen, 20 Units, Subcutaneous, QPM  isosorbide  mononitrate (IMDUR ) 24 hr extended release tablet, 30 mg, Oral, QAM  losartan (COZAAR) tablet, 50 mg, Oral, 2x/day  metroNIDAZOLE  (FLAGYL) tablet, 250 mg, Oral, 3x/day  nitroGLYCERIN  (NITROSTAT ) sublingual tablet, 0.4 mg, Sublingual, Q5 Min PRN  nortriptyline  (PAMELOR ) capsule, 25 mg, Oral, NIGHTLY  NS 250 mL flush bag, , Intravenous, Q15 Min PRN  NS flush syringe, 10 mL, Intracatheter, Q8HRS  NS flush syringe, 10 mL, Intracatheter, Q1H PRN  pantoprazole  (PROTONIX ) delayed release tablet, 40 mg, Oral, 2x/day AC  promethazine  (PHENERGAN ) tablet, 25 mg, Oral, Q6H PRN  risperiDONE  (risperDAL ) tablet, 1 mg, Oral, 2x/day  vancomycin (VANCOCIN) capsule, 250 mg, Oral, 4x/day        Physical Exam:    Constitutional: Patient appears stated age, sitting in bed, appears uncomfortable however in no acute distress   Eyes:  Conjunctiva clear, Pupils equal and round.   Neck:  Supple, symmetrical, trachea midline.  Respiratory:  No audible wheezing or crackles   Cardiovascular:  Regular rate and rhythm, no murmurs.  Gastrointestinal: Abdomen soft, mild tenderness epigastric region.   Musculoskeletal:  Moves all 4 limbs  Integumentary:  Skin warm and dry, No rashes or lesions.  Neurologic:  CN II - XII grossly intact, No tremor.   Extremities: No significant BLE edema     Labs:    CBC (Last 24 Hours):    No results for input(s): WBC, HGB, HCT, MCV, PLTCNT in the last 24 hours.      Last BMP  (Last result in the past 24 hours)        Na   K   Cl   CO2   BUN   Cr   Calcium   Glucose   Glucose-Fasting        05/11/24 0611 138  Comment: Lipemia can alter results at this level (mild).   4.0   108   21   6  Comment: Lipemia alters results at this level (mild). Repeat testing with new specimen suggested.   0.80   9.2  Comment: Gadolinium-containing contrast can interfere with calcium measurement.     308                 No results for input(s): MAGNESIUM, PHOSPHORUS in the last 24 hours.    No results for input(s): INR, APTT in the last 24 hours.  No results found for: UHCEASTTROPI      Imaging:             Microbiology:  Hospital Encounter on  05/07/24 (from the past 96 hours)   C.DIFF TOXIN DNA    Collection Time: 05/09/24  8:54 AM    Specimen: Stool   Culture Result Status    C.DIFF TOXIN DNA Positive (A) Final    Narrative    RESULTS ARE FOR CLOSTRIDIUM DIFFICILE TARGET DNA             Assessment/ Plan:     Active Hospital Problems  Diagnosis    Primary Problem: DKA (diabetic ketoacidosis)    Nausea and vomiting    Elevated LFTs       DKA: Appears resolving, bicarb and anion gap have returned to normal range.  Remains on insulin  drip at 5-6 units/hour.  Blood sugar still over 200.  Tested positive for C diff yesterday which is probably keeping her sugars elevated.  She is tolerating a diabetic diet without nausea or vomiting.  Plan to discontinue IV fluids.  Family bringing in her U500 so we can initiate while she is hospitalized.  Also plan to add long-acting glargine.  Suspect that she will need at least 50 units q.a.m. and 20 units q.p.m.Aaron Aas  Starting on her U500 dosing and we have been able to discontinue the insulin  drip but her sugar still running in the 300 range.  Still trying to adjust her insulin  so we get better control prior to discharge home  Nausea/Vomiting: Persistent symptoms over the past 1 week. Remains with Phenergan  prn for N/V. Empiric GI prophylaxis with IV PPI. CT imaging of abd/pelvis unremarkable per Radiology read. Plan to check MRI of abdomen was unremarkable.  C diff positive.  Since treatment started her nausea and vomiting have subsided.  Still having diarrhea as of this morning  Elevated LFT's: Elevation in AST/ALT, Alk phos and mild elevation in Lipase. Dyspepsia symptoms with N/V and epigastric pain over the past 7-10 days. CT imaging of abd/pelvis report reviewed, plan to check MRI imaging to evaluate liver, pancreas and ducts to exclude obstruction.  MRI was unremarkable.  LFTs are improved  Hypokalemia:  Normalization of potassium with replacement   5.    Hypertension:  Blood pressure escalating today and I will add  Cozaar 50 mg twice daily   6.     Confusion:  Patient reportedly having some brain fog issues today with difficulty texting.  Check CT scan of head without contrast    Chronic medical conditions:   1. Coronary artery disease: Patient has had MI and stent placement within a year.  Initial troponin was 6.1  2. Hypertension: We will continue her current meds as tolerated   3. Type 2 diabetes mellitus: Patient follows with Endocrinology and she is on a schedule of U 500 and Humalog.  She is not on any long-acting insulin .  She takes 130 units of U 500 every morning, 105-130 units at 2:00 p.m., and 105-130 units at 9:00 p.m.Aaron Aas  She utilizes Humalog up to 60 units per day depending on elevated sugars not responding to U 500.  Scheduling glargine 50 units q.a.m. and 20 units q.p.m.  4.  Hyperlipidemia:  We will continue her current meds.  5.  Tobacco abuse: We will apply Nicoderm patch      DVT/PE Prophylaxis: Enoxaparin   Diet: DIET DIABETIC Carb Amount: CCD3 = 45 Grams    Angelia Kelp, DO   Hospitalist, [facility]

## 2024-05-11 NOTE — Care Plan (Signed)
 Problem: Adult Inpatient Plan of Care  Goal: Plan of Care Review  Outcome: Ongoing (see interventions/notes)  Goal: Patient-Specific Goal (Individualized)  Outcome: Ongoing (see interventions/notes)  Goal: Absence of Hospital-Acquired Illness or Injury  Outcome: Ongoing (see interventions/notes)  Intervention: Identify and Manage Fall Risk  Recent Flowsheet Documentation  Taken 05/11/2024 0730 by Star East, RN  Safety Promotion/Fall Prevention:   activity supervised   nonskid shoes/slippers when out of bed   safety round/check completed  Intervention: Prevent Skin Injury  Recent Flowsheet Documentation  Taken 05/11/2024 0730 by Star East, RN  Body Position: supine  Skin Protection:   adhesive use limited   tubing/devices free from skin contact  Intervention: Prevent and Manage VTE (Venous Thromboembolism) Risk  Recent Flowsheet Documentation  Taken 05/11/2024 0730 by Star East, RN  VTE Prevention/Management: ambulation promoted  Intervention: Prevent Infection  Recent Flowsheet Documentation  Taken 05/11/2024 0730 by Star East, RN  Infection Prevention:   glycemic control managed   promote handwashing   rest/sleep promoted   single patient room provided  Goal: Optimal Comfort and Wellbeing  Outcome: Ongoing (see interventions/notes)  Goal: Rounds/Family Conference  Outcome: Ongoing (see interventions/notes)     Problem: Diabetes  Goal: Optimal Coping  Outcome: Ongoing (see interventions/notes)  Goal: Optimal Functional Ability  Outcome: Ongoing (see interventions/notes)  Goal: Blood Glucose Level Within Target Range  Outcome: Ongoing (see interventions/notes)  Goal: Minimize Hypoglycemia Risk  Outcome: Ongoing (see interventions/notes)     Problem: Skin Injury Risk Increased  Goal: Skin Health and Integrity  Outcome: Ongoing (see interventions/notes)  Intervention: Optimize Skin Protection  Recent Flowsheet Documentation  Taken 05/11/2024 0730 by Star East, RN  Pressure Reduction Techniques: Frequent weight shifting  encouraged  Pressure Reduction Devices: Pressure redistributing mattress utilized  Skin Protection:   adhesive use limited   tubing/devices free from skin contact  Head of Bed (HOB) Positioning: HOB elevated

## 2024-05-12 ENCOUNTER — Other Ambulatory Visit: Payer: Self-pay

## 2024-05-12 LAB — POC BLOOD GLUCOSE (RESULTS)
GLUCOSE, POC: 134 mg/dL — ABNORMAL HIGH (ref 70–99)
GLUCOSE, POC: 208 mg/dL — ABNORMAL HIGH (ref 70–99)

## 2024-05-12 MED ORDER — PANTOPRAZOLE 40 MG TABLET,DELAYED RELEASE
40.0000 mg | DELAYED_RELEASE_TABLET | Freq: Every day | ORAL | 0 refills | Status: AC
Start: 2024-05-12 — End: 2024-08-10

## 2024-05-12 MED ORDER — LACTOBACILLUS RHAMNOSUS GG 10 BILLION CELL CAPSULE
1.0000 | ORAL_CAPSULE | Freq: Two times a day (BID) | ORAL | 0 refills | Status: AC
Start: 2024-05-12 — End: 2024-06-11

## 2024-05-12 MED ORDER — ACETAMINOPHEN 325 MG TABLET
650.0000 mg | ORAL_TABLET | ORAL | Status: AC | PRN
Start: 2024-05-12 — End: ?

## 2024-05-12 MED ORDER — INSULIN GLARGINE-YFGN (U-100) 100 UNIT/ML (3 ML) SUBCUTANEOUS PEN
20.0000 [IU] | PEN_INJECTOR | Freq: Every evening | SUBCUTANEOUS | 0 refills | Status: AC
Start: 2024-05-12 — End: 2024-08-10

## 2024-05-12 MED ORDER — LOSARTAN 50 MG TABLET
50.0000 mg | ORAL_TABLET | Freq: Two times a day (BID) | ORAL | 0 refills | Status: AC
Start: 2024-05-12 — End: 2024-08-10

## 2024-05-12 MED ORDER — LACTOBACILLUS RHAMNOSUS GG 10 BILLION CELL CAPSULE
1.0000 | ORAL_CAPSULE | Freq: Two times a day (BID) | ORAL | Status: DC
Start: 2024-05-12 — End: 2024-05-12

## 2024-05-12 MED ORDER — PROMETHAZINE 25 MG TABLET
25.0000 mg | ORAL_TABLET | Freq: Four times a day (QID) | ORAL | 0 refills | Status: AC | PRN
Start: 2024-05-12 — End: 2024-06-11

## 2024-05-12 MED ORDER — INSULIN GLARGINE-YFGN (U-100) 100 UNIT/ML (3 ML) SUBCUTANEOUS PEN
50.0000 [IU] | PEN_INJECTOR | Freq: Every day | SUBCUTANEOUS | 0 refills | Status: AC
Start: 2024-05-13 — End: 2024-08-11

## 2024-05-12 MED ORDER — METRONIDAZOLE 250 MG TABLET
ORAL_TABLET | ORAL | 0 refills | Status: AC
Start: 2024-05-12 — End: 2024-06-02

## 2024-05-12 NOTE — Discharge Summary (Signed)
 [facility]  DISCHARGE SUMMARY    PATIENT NAME:  Kristy Santiago, Kristy Santiago  MRN:  [id number]  DOB:  Mar 04, 1991    ENCOUNTER DATE:  05/07/2024  INPATIENT ADMISSION DATE: 05/07/2024  DISCHARGE DATE:  05/12/2024    ATTENDING PHYSICIAN: Angelia Kelp, DO  SERVICE: [facility]  PRIMARY CARE PHYSICIAN: Silvana Drones, FNP       Lay Caregiver Name: Nyrah Demos   Lay Caregiver Contact Number: [phone]   Lay Caregiver Relationship to patient: parent    PRIMARY DISCHARGE DIAGNOSIS: DKA (diabetic ketoacidosis)  Active Hospital Problems    Diagnosis Date Noted    Principal Problem: DKA (diabetic ketoacidosis) [E11.10] 05/07/2024    Nausea and vomiting [R11.2] 05/08/2024    Elevated LFTs [R79.89] 05/08/2024      Resolved Hospital Problems   No resolved problems to display.     There are no active non-hospital problems to display for this patient.            Current Discharge Medication List        START taking these medications.        Details   acetaminophen  325 mg Tablet  Commonly known as: TYLENOL    650 mg, Oral, EVERY 4 HOURS PRN  Refills: 0     * insulin  glargine-yfgn 100 Units/mL Insulin  Pen   20 Units, Subcutaneous, EVERY EVENING  Qty: 18 mL  Refills: 0     * insulin  glargine-yfgn 100 Units/mL Insulin  Pen  Start taking on: May 13, 2024   50 Units, Subcutaneous, Daily  Qty: 45 mL  Refills: 0     lactobacillus rhamnosus (GG) 10 billion cell Capsule  Commonly known as: CULTURELLE   1 Capsule, Oral, 2 TIMES DAILY WITH FOOD  Qty: 60 Capsule  Refills: 0     losartan 50 mg Tablet  Commonly known as: COZAAR   50 mg, Oral, 2 TIMES DAILY  Qty: 180 Tablet  Refills: 0     metroNIDAZOLE 250 mg Tablet  Commonly known as: FLAGYL  Start taking on: May 12, 2024   Take 1 Tablet (250 mg total) by mouth Three times a day for 7 days, THEN 1 Tablet (250 mg total) Twice daily for 7 days, THEN 1 Tablet (250 mg total) Daily for 7 days.  Qty: 42 Tablet  Refills: 0     pantoprazole  40 mg Tablet, Delayed Release (E.C.)  Commonly  known as: PROTONIX    40 mg, Oral, Daily  Qty: 90 Tablet  Refills: 0     promethazine  25 mg Tablet  Commonly known as: PHENERGAN    25 mg, Oral, EVERY 6 HOURS PRN  Qty: 30 Tablet  Refills: 0           * This list has 2 medication(s) that are the same as other medications prescribed for you. Read the directions carefully, and ask your doctor or other care provider to review them with you.                CONTINUE these medications - NO CHANGES were made during your visit.        Details   aspirin  81 mg Tablet, Delayed Release (E.C.)  Commonly known as: ECOTRIN   81 mg, Daily  Refills: 0     atorvastatin  80 mg Tablet  Commonly known as: LIPITOR   80 mg, EVERY EVENING  Refills: 0     Baclofen  20 mg Tablet  Commonly known as: LIORESAL    20 mg, 3 TIMES DAILY  Refills:  0     carvediloL  25 mg Tablet  Commonly known as: COREG    25 mg, 2 TIMES DAILY WITH FOOD  Refills: 0     chlorhexidine  gluconate 0.12 % Mouthwash  Commonly known as: PERIDEX    15 mL, 2 TIMES DAILY  Refills: 0     clonazePAM  1 mg Tablet  Commonly known as: klonoPIN    1 mg, Oral, 3 TIMES DAILY  Qty: 90 Tablet  Refills: 5     clopidogreL  75 mg Tablet  Commonly known as: PLAVIX    75 mg, Daily  Refills: 0     ezetimibe  10 mg Tablet  Commonly known as: ZETIA    10 mg, EVERY EVENING  Refills: 0     fenofibrate  micronized 200 mg Capsule  Commonly known as: LOFIBRA   200 mg, Daily  Refills: 0     hydrOXYzine  pamoate 100 mg Capsule  Commonly known as: VISTARIL    Take 1-2 tablets daily as needed for anxiety or insomnia.  Qty: 180 Capsule  Refills: 12     isosorbide  mononitrate 30 mg Tablet Sustained Release 24 hr  Commonly known as: IMDUR    30 mg, EVERY MORNING  Refills: 0     nitroGLYCERIN  0.4 mg Tablet, Sublingual  Commonly known as: NITROSTAT    0.4 mg, EVERY 5 MIN PRN  Refills: 0     nortriptyline  25 mg Capsule  Commonly known as: PAMELOR    25 mg, Oral, NIGHTLY  Qty: 90 Capsule  Refills: 3     risperiDONE  1 mg Tablet  Commonly known as: risperDAL    1 mg, Oral, 2 TIMES  DAILY  Qty: 180 Tablet  Refills: 3            Discharge med list refreshed?  YES     Allergies[1]  HOSPITAL PROCEDURE(S):   No orders of the defined types were placed in this encounter.      REASON FOR HOSPITALIZATION AND HOSPITAL COURSE   BRIEF HPI:  This is a 33 y.o., female with past medical history of type 2 diabetes mellitus on insulin  for 9 years and follows with Endocrinology, coronary artery disease status post MI and stent deployment last year at [facility], long QT syndrome, hypertension, hyperlipidemia, obesity. Patient admitted to [facility] yesterday with concern for DKA. Appears resolving with improvement in bicarb and resolution of anion gap. Patient remains with nausea and some mild epigastric pain. Lab data significant for elevated LFT's. CT abd/pelvis showed normal study. Plan to check MRI of abdomen/MRCP to evaluate liver and bile ducts given persistent mild elevation of LFTs and pancreatic enzymes.  MRI abdomen did not show any abnormalities.  Repeat CMP showed decreasing liver enzymes and normalization of her potassium. Patient is still requiring 6 units per hour on the insulin  drip.  Tolerating advancing diet without nausea or vomiting.  IV fluids discontinued.  Stool studies returned positive for C diff which probably is what was causing her nausea on admission.  Patient reports taking outpatient antibiotics several weeks ago.  Diarrhea seems to be improving on Flagyl and vancomycin but remains persistent today.  Family brought in her U500 and we have been using it in addition to glargine.  I suspect that she will need 50 units of glargine every morning and 20 units every evening.  We have also given an additional injection of Humalog for elevated blood sugar in between her U500 readings.  She is off the insulin  drip.  Blood pressure is grossly elevated today.  She reports at home that she  has several out of proportion blood pressure readings.  Plan to start Cozaar 50 mg twice daily.  Patient also  complaining of increasing brain fog with this admission and having difficulty texting.  She is a high-risk candidate for stroke therefore I will order CT scan of the head without contrast which was read as unremarkable.  Fasting blood sugar this morning was 134.  I have advised her to take the glargine 50 units q.a.m. and 20 units q.p.m. and utilize her U500 130 units at her scheduled injection times and only use the Humalog if her sugars are grossly elevated.  I encouraged her to follow up with her primary care physician in the next 1-2 days.  She will also need to follow up with her endocrinologist.  I have placed her on a tapering Flagyl schedule due to her C diff colitis.  She will taper this medicine over 3 weeks.  She probably will need retested in the outpatient setting.  She will need repeat blood work in the next 1-2 weeks.  She also we will need to keep a record of her blood pressure readings and bring those to [facility] for evaluation.           TRANSITION/POST DISCHARGE CARE/PENDING TESTS/REFERRALS:  Needs routine labs in 1-2 weeks    CONDITION ON DISCHARGE:  A. Ambulation: Full ambulation  B. Self-care Ability: Complete  C. Cognitive Status Alert  D. Code status at discharge:       LINES/DRAINS/WOUNDS AT DISCHARGE:   Patient Lines/Drains/Airways Status       Active Line / Dialysis Catheter / Dialysis Graft / Drain / Airway / Wound       Name Placement date Placement time Site Days    Peripheral IV Anterior;Right 05/07/24  1402  -- 4    Peripheral IV Left Basilic  (medial side of arm) 05/07/24  1637  -- 4                    DISCHARGE DISPOSITION:  Home discharge  DISCHARGE INSTRUCTIONS:    No discharge procedures on file.       Angelia Kelp, DO    Copies sent to Care Team         Relationship Specialty Notifications Start End    Rembert, Mylinda Asa, [place] PCP - General FAMILY NURSE PRACTITIONER  01/29/23     Phone: [phone] Fax: [phone]         [address] [place]  [address]            Referring  providers can utilize [email or web address] to access their referred [facility] Medicine patient's information.                               [1]   Allergies  Allergen Reactions    Invega [Paliperidone] Hives/ Urticaria     Patient also noted irregular heart rate, bloodshot eyes, and feeling unwell.    Ondansetron   Other Adverse Reaction (Add comment)     Prolonged QTC    Victoza [Liraglutide] Nausea/ Vomiting

## 2024-05-12 NOTE — Care Plan (Signed)
 Pt did not have a fever. Pt received medication per MAR. Pt requested pain medication for knee. Pt was medicated per MAR. Pt had an uneventful night. No concerns or complaints voiced at this time. Safety precautions maintained. Call bell within reach. Will continue to monitor pt.  Problem: Adult Inpatient Plan of Care  Goal: Plan of Care Review  Outcome: Ongoing (see interventions/notes)  Goal: Patient-Specific Goal (Individualized)  Outcome: Ongoing (see interventions/notes)  Goal: Absence of Hospital-Acquired Illness or Injury  Outcome: Ongoing (see interventions/notes)  Intervention: Identify and Manage Fall Risk  Recent Flowsheet Documentation  Taken 05/12/2024 0515 by Gwenn Lenz, RN  Safety Promotion/Fall Prevention:   activity supervised   fall prevention program maintained   nonskid shoes/slippers when out of bed   safety round/check completed  Taken 05/12/2024 0301 by Gwenn Lenz, RN  Safety Promotion/Fall Prevention:   activity supervised   fall prevention program maintained   nonskid shoes/slippers when out of bed   safety round/check completed  Taken 05/12/2024 0117 by Gwenn Lenz, RN  Safety Promotion/Fall Prevention:   activity supervised   fall prevention program maintained   nonskid shoes/slippers when out of bed   safety round/check completed  Intervention: Prevent Infection  Recent Flowsheet Documentation  Taken 05/12/2024 0515 by Gwenn Lenz, RN  Infection Prevention:   barrier precautions utilized   personal protective equipment utilized   glycemic control managed   promote handwashing   rest/sleep promoted  Taken 05/12/2024 0301 by Gwenn Lenz, RN  Infection Prevention:   barrier precautions utilized   glycemic control managed   personal protective equipment utilized   promote handwashing   rest/sleep promoted  Taken 05/12/2024 0117 by Gwenn Lenz, RN  Infection Prevention:   barrier precautions utilized   glycemic control managed   promote handwashing   rest/sleep promoted   personal protective equipment  utilized  Goal: Optimal Comfort and Wellbeing  Outcome: Ongoing (see interventions/notes)  Goal: Rounds/Family Conference  Outcome: Ongoing (see interventions/notes)     Problem: Diabetes  Goal: Optimal Coping  Outcome: Ongoing (see interventions/notes)  Goal: Optimal Functional Ability  Outcome: Ongoing (see interventions/notes)  Goal: Blood Glucose Level Within Target Range  Outcome: Ongoing (see interventions/notes)  Goal: Minimize Hypoglycemia Risk  Outcome: Ongoing (see interventions/notes)     Problem: Skin Injury Risk Increased  Goal: Skin Health and Integrity  Outcome: Ongoing (see interventions/notes)

## 2024-05-12 NOTE — Pharmacy (Signed)
 Reviewed discharge medication list with patient, reviewed dose, strength, frequency and use for each medication. Patient denied further questions. Patient did request new medications be sent to pill box pharmacy instead of mail order. Notified nurse to change pharmacy.

## 2024-05-12 NOTE — Nurses Notes (Signed)

## 2024-05-13 ENCOUNTER — Telehealth (HOSPITAL_COMMUNITY): Payer: Self-pay | Admitting: NURSE PRACTITIONER

## 2024-05-14 ENCOUNTER — Telehealth (HOSPITAL_COMMUNITY): Payer: Self-pay | Admitting: NURSE PRACTITIONER

## 2024-05-14 ENCOUNTER — Encounter (INDEPENDENT_AMBULATORY_CARE_PROVIDER_SITE_OTHER): Payer: Self-pay | Admitting: PHYSICIAN ASSISTANT

## 2024-05-15 ENCOUNTER — Encounter (INDEPENDENT_AMBULATORY_CARE_PROVIDER_SITE_OTHER): Payer: Self-pay | Admitting: PHYSICIAN ASSISTANT

## 2024-05-15 ENCOUNTER — Telehealth (INDEPENDENT_AMBULATORY_CARE_PROVIDER_SITE_OTHER): Payer: Self-pay

## 2024-05-15 NOTE — Telephone Encounter (Signed)
 05/15/24 Lvm asking for call back to schedule with Tinnie Crawford anytime in July MSM

## 2024-06-16 NOTE — Telephone Encounter (Signed)
 Has FU with Dr. Johnsie on 06-25-24. Will hold until then.

## 2024-06-18 ENCOUNTER — Encounter (INDEPENDENT_AMBULATORY_CARE_PROVIDER_SITE_OTHER): Payer: Self-pay | Admitting: PHYSICIAN ASSISTANT

## 2024-06-24 ENCOUNTER — Telehealth: Payer: MEDICAID | Admitting: PHYSICIAN ASSISTANT

## 2024-06-24 ENCOUNTER — Other Ambulatory Visit: Payer: Self-pay

## 2024-06-24 DIAGNOSIS — F411 Generalized anxiety disorder: Secondary | ICD-10-CM

## 2024-06-24 DIAGNOSIS — F845 Asperger's syndrome: Secondary | ICD-10-CM

## 2024-06-24 DIAGNOSIS — Z8659 Personal history of other mental and behavioral disorders: Secondary | ICD-10-CM

## 2024-06-24 DIAGNOSIS — F959 Tic disorder, unspecified: Secondary | ICD-10-CM

## 2024-06-24 MED ORDER — RISPERIDONE 0.5 MG TABLET
0.5000 mg | ORAL_TABLET | Freq: Two times a day (BID) | ORAL | 3 refills | Status: AC
Start: 2024-06-24 — End: ?

## 2024-06-24 NOTE — Progress Notes (Signed)
 Psychiatry Telemedicine Outpatient Follow-up  TELEMEDICINE DOCUMENTATION:    Patient Location:  MyChart video visit from home address: [address]  [place] [address]    Patient/family aware of provider location:  yes  Patient/family consent for telemedicine:  yes  Examination observed and performed by:  Tinnie Crawford, PA-C   Date of service: 06/24/2024  Patient Name: Kristy Santiago  MRN: [id number]      CC: Medication follow-up for GAD, tic disorder, aspberger syndrome, history of psychosis    Subjective: Pt is a 33 yo female who was last seen for med check appointment on 04/16/2024. At that time, her medication regimen was adjusted as follows:    -Continue Risperdal  1 mg BID. Patient wishes to have her endocrinologist, cardiologist and PCP monitor her lipid profile and blood glucose levels.   -Continue Pamelor  to 25 mg qhs.  -Increase Klonopin  to 1 mg TID.  -Continue hydroxyzine  to 100 mg up to TID prn anxiety/insomnia.     The patient reports today that she was hospitalized due to dental complications. She ended up in DKA and was then diagnosed with C diff. She did feel a worsening of her depression when she was sick because she couldn't do anything. She is now discharged from the hospital and is slowly starting to feel better. She is relieved that she had her last dental extraction. She is doing well with the increase in klonopin  dosage. Her anxiety is much more under control. She takes 2 hydroxyzine  capsules at bedtime. Her cardiologist is really trying to help her get her cholesterol down.     Social history: The patient does not work, homeschools her daughter. She lives with her mom and stepdad.     ROS:  Psych: As per HPI    Current medications: acetaminophen  (TYLENOL ) 325 mg Oral Tablet, Take 2 Tablets (650 mg total) by mouth Every 4 hours as needed  aspirin  (ECOTRIN) 81 mg Oral Tablet, Delayed Release (E.C.), Take 1 Tablet (81 mg total) by mouth Daily  atorvastatin  (LIPITOR) 80 mg Oral Tablet, Take  1 Tablet (80 mg total) by mouth Every evening  Baclofen  (LIORESAL ) 20 mg Oral Tablet, Take 1 Tablet (20 mg total) by mouth Three times a day  carvediloL  (COREG ) 25 mg Oral Tablet, Take 1 Tablet (25 mg total) by mouth Twice daily with food  chlorhexidine  gluconate (PERIDEX ) 0.12 % Mucous Membrane Mouthwash, Swish and spit 15 mL Twice daily  clonazePAM  (KLONOPIN ) 1 mg Oral Tablet, Take 1 Tablet (1 mg total) by mouth Three times a day  clopidogreL  (PLAVIX ) 75 mg Oral Tablet, Take 1 Tablet (75 mg total) by mouth Daily  ezetimibe  (ZETIA ) 10 mg Oral Tablet, Take 1 Tablet (10 mg total) by mouth Every evening  fenofibrate  micronized (LOFIBRA) 200 mg Oral Capsule, Take 1 Capsule (200 mg total) by mouth Daily  hydrOXYzine  pamoate (VISTARIL ) 100 mg Oral Capsule, Take 1-2 tablets daily as needed for anxiety or insomnia.  insulin  glargine-yfgn 100 Units/mL Subcutaneous Insulin  Pen, Inject 50 Units under the skin Daily for 90 days  insulin  glargine-yfgn 100 Units/mL Subcutaneous Insulin  Pen, Inject 20 Units under the skin Every evening for 90 days  isosorbide  mononitrate (IMDUR ) 30 mg Oral Tablet Sustained Release 24 hr, Take 1 Tablet (30 mg total) by mouth Every morning  losartan  (COZAAR ) 50 mg Oral Tablet, Take 1 Tablet (50 mg total) by mouth Twice daily for 90 days  nitroGLYCERIN  (NITROSTAT ) 0.4 mg Sublingual Tablet, Sublingual, Place 1 Tablet (0.4 mg total) under the tongue Every  5 minutes as needed for Chest pain for 3 doses over 15 minutes  nortriptyline  (PAMELOR ) 25 mg Oral Capsule, Take 1 Capsule (25 mg total) by mouth Every night  pantoprazole  (PROTONIX ) 40 mg Oral Tablet, Delayed Release (E.C.), Take 1 Tablet (40 mg total) by mouth Daily for 90 days  risperiDONE  (RISPERDAL ) 1 mg Oral Tablet, Take 1 Tablet (1 mg total) by mouth Twice daily    No facility-administered medications prior to visit.      Allergies: Allergies[1]    Objective:  General appearance: WNWD female in NAD  HEENT: Normocephalic, atraumatic, PERR, EOM  intact, anicteric, MMM, dentition in good repair, no apparent thyromegaly  Skin: Warm, dry, intact, no rash or abrasion  Neuro: A&O x4 to person, place, date, and situation    Mental Status Examination:  Appearance: Appropriate grooming and hygiene, in seasonally-appropriate casual attire, appears about stated age, normal gait.  Behavior: Calm, pleasant, and cooperative. Appropriate eye-contact.  Speech: Easily able to interpret. Regular in rate, rhythm, tone, and volume.  Mood: Okay  Affect: Mood congruent, euthymic, full-range  Thought Process: Logical, linear, and goal-directed. No blocking, flight of ideas, or loose associations.  Thought Content: Without suicidal or homicidal ideations  Perceptions: Denies A/V hallucinations. No evidence of paranoid thinking or delusional thought processes. Not responding to internal stimuli.  Memory: Immediate recall, short term, and remote past memory grossly intact.  Language: appropriate recognition and naming. No perseveration or delay in response.  Fund of knowledge: Intact. Intelligence estimated to be about average.  Attention span and concentration: Sustained/Intact  Insight: Fair  Judgment: Fair    Labs:    Assessment:  This patient is a 33 yo female seen for follow-up medication appointment. Pt reports wanting to try to decrease Risperdal  dosage to help with cholesterol and blood sugar control. Medications were discussed in detail including risks, benefits, and alternatives. The diagnosis is as follows:    ICD-10-CM    1. GAD (generalized anxiety disorder)  F41.1       2. Asperger syndrome  F84.5       3. Tic disorder  F95.9       4. History of psychosis  Z86.59             Plan:  1. Return to clinic in 4 weeks.   -May return sooner if needed or contact via phone or [facility].    2. Pharmacological intervention as follows:  -Decrease Risperdal  to 0.5 mg BID. Patient wishes to have her endocrinologist, cardiologist and PCP monitor her lipid profile and blood  glucose levels.   -Continue Pamelor  25 mg qhs.  -Continue Klonopin  to 1 mg TID.  -Continue hydroxyzine  100 mg up to TID prn anxiety/insomnia.     3. Labs:     4. Psychotherapy discussed     5. Supportive therapy provided and non-pharmacological treatment modalities reviewed      All current medications, including OTC meds, have been reviewed, discussed, and adjusted as clinically appropriate with the patient. The patient expressed an understanding of and agreement with the plan outlined above.      Patient advised to call, return to the clinic, or contact provider via [facility] to report side effects or worsening of symptoms. Also reviewed emergency contacts including after-hours [facility] line, [place] [facility] line, ED, and 911 for emergencies.  Patient was informed of the 24 hour per day, 7 days a week psychiatric coverage in the emergency department.    Tinnie Crawford, PA-C          [  1]   Allergies  Allergen Reactions    Invega [Paliperidone] Hives/ Urticaria     Patient also noted irregular heart rate, bloodshot eyes, and feeling unwell.    Ondansetron   Other Adverse Reaction (Add comment)     Prolonged QTC    Victoza [Liraglutide] Nausea/ Vomiting

## 2024-07-24 ENCOUNTER — Other Ambulatory Visit: Payer: Self-pay

## 2024-07-24 ENCOUNTER — Encounter (INDEPENDENT_AMBULATORY_CARE_PROVIDER_SITE_OTHER): Payer: Self-pay | Admitting: PHYSICIAN ASSISTANT

## 2024-07-24 ENCOUNTER — Telehealth: Payer: Self-pay | Admitting: PHYSICIAN ASSISTANT

## 2024-07-24 DIAGNOSIS — F411 Generalized anxiety disorder: Secondary | ICD-10-CM

## 2024-07-24 DIAGNOSIS — Z8659 Personal history of other mental and behavioral disorders: Secondary | ICD-10-CM

## 2024-07-24 DIAGNOSIS — F959 Tic disorder, unspecified: Secondary | ICD-10-CM

## 2024-07-24 DIAGNOSIS — F845 Asperger's syndrome: Secondary | ICD-10-CM

## 2024-07-24 NOTE — Progress Notes (Signed)
 Psychiatry Telemedicine Outpatient Follow-up  TELEMEDICINE DOCUMENTATION:    Patient Location:  MyChart video visit from home address: [address]  [place] [address]    Patient/family aware of provider location:  yes  Patient/family consent for telemedicine:  yes  Examination observed and performed by:  Tinnie Crawford, PA-C   Date of service: 07/24/2024  Patient Name: Kristy Santiago  MRN: [id number]      CC: Medication follow-up for GAD, history of psychosis, Aspberger Syndrome, tic disorder    Subjective: Pt is a 33 yo female who was last seen for med check appointment on 06/24/2024. At that time, her medication regimen was adjusted as follows:    -Decrease Risperdal  to 0.5 mg BID. Patient wishes to have her endocrinologist, cardiologist and PCP monitor her lipid profile and blood glucose levels.   -Continue Pamelor  25 mg qhs.  -Continue Klonopin  1 mg TID.  -Continue hydroxyzine  100 mg up to TID prn anxiety/insomnia.     The patient reports today that she is doing well with the decrease in Risperdal  dose. She denies changes in her sleep. She is going to get her cholesterol checked next month. Her mood overall is pretty good. Despite family stress, she feels relatively good about her mood. She has felt more anxious driving lately. Her mom and stepdad whom she lives with have been arguing. Her mother's multiple sclerosis and cognitive functioning has been worsening as well. She normally takes the hydroxyzine  about twice daily, but there are some days when she takes it three times daily.     Social history: The patient does not work, homeschools her daughter. She lives with her mom and stepdad.     ROS:  Psych: As per HPI    Current medications: acetaminophen  (TYLENOL ) 325 mg Oral Tablet, Take 2 Tablets (650 mg total) by mouth Every 4 hours as needed  aspirin  (ECOTRIN) 81 mg Oral Tablet, Delayed Release (E.C.), Take 1 Tablet (81 mg total) by mouth Daily  atorvastatin  (LIPITOR) 80 mg Oral Tablet, Take 1 Tablet  (80 mg total) by mouth Every evening  Baclofen  (LIORESAL ) 20 mg Oral Tablet, Take 1 Tablet (20 mg total) by mouth Three times a day  carvediloL  (COREG ) 25 mg Oral Tablet, Take 1 Tablet (25 mg total) by mouth Twice daily with food  chlorhexidine  gluconate (PERIDEX ) 0.12 % Mucous Membrane Mouthwash, Swish and spit 15 mL Twice daily  clonazePAM  (KLONOPIN ) 1 mg Oral Tablet, Take 1 Tablet (1 mg total) by mouth Three times a day  clopidogreL  (PLAVIX ) 75 mg Oral Tablet, Take 1 Tablet (75 mg total) by mouth Daily  ezetimibe  (ZETIA ) 10 mg Oral Tablet, Take 1 Tablet (10 mg total) by mouth Every evening  fenofibrate  micronized (LOFIBRA) 200 mg Oral Capsule, Take 1 Capsule (200 mg total) by mouth Daily  hydrOXYzine  pamoate (VISTARIL ) 100 mg Oral Capsule, Take 1-2 tablets daily as needed for anxiety or insomnia.  insulin  glargine-yfgn 100 Units/mL Subcutaneous Insulin  Pen, Inject 50 Units under the skin Daily for 90 days  insulin  glargine-yfgn 100 Units/mL Subcutaneous Insulin  Pen, Inject 20 Units under the skin Every evening for 90 days  isosorbide  mononitrate (IMDUR ) 30 mg Oral Tablet Sustained Release 24 hr, Take 1 Tablet (30 mg total) by mouth Every morning  losartan  (COZAAR ) 50 mg Oral Tablet, Take 1 Tablet (50 mg total) by mouth Twice daily for 90 days  nitroGLYCERIN  (NITROSTAT ) 0.4 mg Sublingual Tablet, Sublingual, Place 1 Tablet (0.4 mg total) under the tongue Every 5 minutes as needed for Chest  pain for 3 doses over 15 minutes  nortriptyline  (PAMELOR ) 25 mg Oral Capsule, Take 1 Capsule (25 mg total) by mouth Every night  pantoprazole  (PROTONIX ) 40 mg Oral Tablet, Delayed Release (E.C.), Take 1 Tablet (40 mg total) by mouth Daily for 90 days  risperiDONE  (RISPERDAL ) 0.5 mg Oral Tablet, Take 1 Tablet (0.5 mg total) by mouth Twice daily    No facility-administered medications prior to visit.      Allergies: Allergies[1]    Objective:  General appearance: WNWD female in NAD  HEENT: Normocephalic, atraumatic, PERR, EOM  intact, anicteric, MMM, dentition in good repair, no apparent thyromegaly  Skin: Warm, dry, intact, no rash or abrasion  Neuro: A&O x4 to person, place, date, and situation    Mental Status Examination:  Appearance: Appropriate grooming and hygiene, in seasonally-appropriate casual attire, appears about stated age, normal gait.  Behavior: Calm, pleasant, and cooperative. Appropriate eye-contact.  Speech: Easily able to interpret. Regular in rate, rhythm, tone, and volume.  Mood: Stressed  Affect: Mood congruent, euthymic, full-range  Thought Process: Logical, linear, and goal-directed. No blocking, flight of ideas, or loose associations.  Thought Content: Without suicidal or homicidal ideations  Perceptions: Denies A/V hallucinations. No evidence of paranoid thinking or delusional thought processes. Not responding to internal stimuli.  Memory: Immediate recall, short term, and remote past memory grossly intact.  Language: appropriate recognition and naming. No perseveration or delay in response.  Fund of knowledge: Intact. Intelligence estimated to be about average.  Attention span and concentration: Sustained/Intact  Insight: Fair  Judgment: Fair    Labs:    Assessment:  This patient is a 33 yo female seen for follow-up medication appointment. Pt reports worsening familial stress. Medications were discussed in detail including risks, benefits, and alternatives. The diagnosis is as follows:    ICD-10-CM    1. GAD (generalized anxiety disorder)  F41.1       2. Asperger syndrome  F84.5       3. Tic disorder  F95.9       4. History of psychosis  Z86.59             Plan:  1. Return to clinic in 4-6 weeks.   -May return sooner if needed or contact via phone or [facility].    2. Pharmacological intervention as follows:  -Continue Risperdal  to 0.5 mg BID. Patient wishes to have her endocrinologist, cardiologist and PCP monitor her lipid profile and blood glucose levels.   -Continue Pamelor  25 mg qhs.  -Continue Klonopin   1 mg TID.  -Continue hydroxyzine  100 mg up to TID prn anxiety/insomnia.     3. Labs: Patient has labwork monitored by endocrinologist and PCP.     4. Psychotherapy discussed     5. Supportive therapy provided and non-pharmacological treatment modalities reviewed      All current medications, including OTC meds, have been reviewed, discussed, and adjusted as clinically appropriate with the patient. The patient expressed an understanding of and agreement with the plan outlined above.      Patient advised to call, return to the clinic, or contact provider via [facility] to report side effects or worsening of symptoms. Also reviewed emergency contacts including after-hours [facility] line, [facility] line, ED, and 911 for emergencies.  Patient was informed of the 24 hour per day, 7 days a week psychiatric coverage in the emergency department.    Tinnie Crawford, PA-C          [1]   Allergies  Allergen Reactions  Invega [Paliperidone] Hives/ Urticaria     Patient also noted irregular heart rate, bloodshot eyes, and feeling unwell.    Ondansetron   Other Adverse Reaction (Add comment)     Prolonged QTC    Victoza [Liraglutide] Nausea/ Vomiting

## 2024-08-25 ENCOUNTER — Telehealth: Payer: PRIVATE HEALTH INSURANCE | Admitting: PHYSICIAN ASSISTANT

## 2024-08-25 DIAGNOSIS — Z8659 Personal history of other mental and behavioral disorders: Secondary | ICD-10-CM

## 2024-08-25 DIAGNOSIS — F959 Tic disorder, unspecified: Secondary | ICD-10-CM

## 2024-08-25 DIAGNOSIS — F411 Generalized anxiety disorder: Secondary | ICD-10-CM

## 2024-08-25 DIAGNOSIS — F845 Asperger's syndrome: Secondary | ICD-10-CM

## 2024-08-25 MED ORDER — CLONIDINE HCL 0.1 MG TABLET
0.1000 mg | ORAL_TABLET | Freq: Every evening | ORAL | 12 refills | Status: AC
Start: 2024-08-25 — End: ?

## 2024-08-25 MED ORDER — CLONAZEPAM 1 MG TABLET
1.0000 mg | ORAL_TABLET | Freq: Three times a day (TID) | ORAL | 5 refills | Status: AC
Start: 2024-09-16 — End: ?

## 2024-08-25 NOTE — Progress Notes (Signed)
 Psychiatry Telemedicine Outpatient Follow-up  TELEMEDICINE DOCUMENTATION:    Patient Location:  MyChart video visit from home address: [address]  [place] [address]    Patient/family aware of provider location:  yes  Patient/family consent for telemedicine:  yes  Examination observed and performed by:  Tinnie Crawford, PA-C   Date of service: 08/25/2024  Patient Name: Kristy Santiago  MRN: [id number]      CC: Medication follow-up for GAD, Asperger syndrome, tic disorder, history of psychosis    Subjective: Pt is a 33 yo female who was last seen for med check appointment on 07/24/2024. At that time, her medication regimen was adjusted as follows:    -Continue Risperdal  to 0.5 mg BID. Patient wishes to have her endocrinologist, cardiologist and PCP monitor her lipid profile and blood glucose levels.   -Continue Pamelor  25 mg qhs.  -Continue Klonopin  1 mg TID.  -Continue hydroxyzine  100 mg up to TID prn anxiety/insomnia.       The patient reports today that the past week her sleep has been horrible. She is having trouble falling asleep and staying asleep. She admits to stress over financial constraints and difficulty affording meals at the end of the month. In terms of anxiety, she is doing pretty good. She only gets anxious when driving or riding in the car. She was previously on clonidine for sleep previously. She monitors her blood pressure, states that he has been high normal and slightly elevated. She normally takes 1 hydroxyzine  during the day and takes 2 hydroxyzine  at bedtime.     Social history:  The patient does not work, homeschools her daughter. She lives with her mom and stepdad.     ROS:  Psych: As per HPI    Current medications: acetaminophen  (TYLENOL ) 325 mg Oral Tablet, Take 2 Tablets (650 mg total) by mouth Every 4 hours as needed  aspirin  (ECOTRIN) 81 mg Oral Tablet, Delayed Release (E.C.), Take 1 Tablet (81 mg total) by mouth Daily  atorvastatin  (LIPITOR) 80 mg Oral Tablet, Take 1 Tablet  (80 mg total) by mouth Every evening  Baclofen  (LIORESAL ) 20 mg Oral Tablet, Take 1 Tablet (20 mg total) by mouth Three times a day  carvediloL  (COREG ) 25 mg Oral Tablet, Take 1 Tablet (25 mg total) by mouth Twice daily with food  chlorhexidine  gluconate (PERIDEX ) 0.12 % Mucous Membrane Mouthwash, Swish and spit 15 mL Twice daily  clonazePAM  (KLONOPIN ) 1 mg Oral Tablet, Take 1 Tablet (1 mg total) by mouth Three times a day  clopidogreL  (PLAVIX ) 75 mg Oral Tablet, Take 1 Tablet (75 mg total) by mouth Daily  ezetimibe  (ZETIA ) 10 mg Oral Tablet, Take 1 Tablet (10 mg total) by mouth Every evening  fenofibrate  micronized (LOFIBRA) 200 mg Oral Capsule, Take 1 Capsule (200 mg total) by mouth Daily  hydrOXYzine  pamoate (VISTARIL ) 100 mg Oral Capsule, Take 1-2 tablets daily as needed for anxiety or insomnia.  isosorbide  mononitrate (IMDUR ) 30 mg Oral Tablet Sustained Release 24 hr, Take 1 Tablet (30 mg total) by mouth Every morning  losartan  (COZAAR ) 50 mg Oral Tablet, Take 1 Tablet (50 mg total) by mouth Twice daily for 90 days  nitroGLYCERIN  (NITROSTAT ) 0.4 mg Sublingual Tablet, Sublingual, Place 1 Tablet (0.4 mg total) under the tongue Every 5 minutes as needed for Chest pain for 3 doses over 15 minutes  nortriptyline  (PAMELOR ) 25 mg Oral Capsule, Take 1 Capsule (25 mg total) by mouth Every night  risperiDONE  (RISPERDAL ) 0.5 mg Oral Tablet, Take 1 Tablet (0.5  mg total) by mouth Twice daily    No facility-administered medications prior to visit.      Allergies: Allergies[1]    Objective:  General appearance: WNWD female in NAD  HEENT: Normocephalic, atraumatic, PERR, EOM intact, anicteric, MMM, dentition in good repair, no apparent thyromegaly  Skin: Warm, dry, intact, no rash or abrasion  Neuro: A&O x4 to person, place, date, and situation    Mental Status Examination:  Appearance: Appropriate grooming and hygiene, in seasonally-appropriate casual attire, appears about stated age, normal gait.  Behavior: Calm, pleasant,  and cooperative. Appropriate eye-contact.  Speech: Easily able to interpret. Regular in rate, rhythm, tone, and volume.  Mood: Okay  Affect: Mood congruent, euthymic, full-range  Thought Process: Logical, linear, and goal-directed. No blocking, flight of ideas, or loose associations.  Thought Content: Without suicidal or homicidal ideations  Perceptions: Denies A/V hallucinations. No evidence of paranoid thinking or delusional thought processes. Not responding to internal stimuli.  Memory: Immediate recall, short term, and remote past memory grossly intact.  Language: appropriate recognition and naming. No perseveration or delay in response.  Fund of knowledge: Intact. Intelligence estimated to be about average.  Attention span and concentration: Sustained/Intact  Insight: Fair  Judgment: Fair    Labs:    Assessment:  This patient is a 33 yo female seen for follow-up medication appointment. Pt reports worsening insomnia. Medications were discussed in detail including risks, benefits, and alternatives. The diagnosis is as follows:    ICD-10-CM    1. GAD (generalized anxiety disorder)  F41.1       2. Asperger syndrome  F84.5       3. History of psychosis  Z86.59       4. Tic disorder  F95.9             Plan:  1. Return to clinic in 4-8 weeks.   -May return sooner if needed or contact via phone or [facility].    2. Pharmacological intervention as follows:  -Continue Risperdal  0.5 mg BID. Patient wishes to have her endocrinologist, cardiologist and PCP monitor her lipid profile and blood glucose levels.   -Continue Pamelor  25 mg qhs.  -Continue Klonopin  1 mg TID.   -Continue hydroxyzine  100 mg up to TID prn anxiety/insomnia.   -Initiate clonidine 0.1 mg qhs. Patient closely monitors blood pressure.     3. Labs:     4. Psychotherapy discussed     5. Supportive therapy provided and non-pharmacological treatment modalities reviewed      All current medications, including OTC meds, have been reviewed, discussed, and  adjusted as clinically appropriate with the patient. The patient expressed an understanding of and agreement with the plan outlined above.      Patient advised to call, return to the clinic, or contact provider via [facility] to report side effects or worsening of symptoms. Also reviewed emergency contacts including after-hours [facility] line, [facility] line, ED, and 911 for emergencies.  Patient was informed of the 24 hour per day, 7 days a week psychiatric coverage in the emergency department.    Tinnie Crawford, PA-C          [1]   Allergies  Allergen Reactions    Invega [Paliperidone] Hives/ Urticaria     Patient also noted irregular heart rate, bloodshot eyes, and feeling unwell.    Ondansetron   Other Adverse Reaction (Add comment)     Prolonged QTC    Victoza [Liraglutide] Nausea/ Vomiting

## 2024-09-29 ENCOUNTER — Telehealth (INDEPENDENT_AMBULATORY_CARE_PROVIDER_SITE_OTHER): Payer: Self-pay | Admitting: PHYSICIAN ASSISTANT

## 2024-09-29 DIAGNOSIS — F959 Tic disorder, unspecified: Secondary | ICD-10-CM

## 2024-09-29 DIAGNOSIS — F845 Asperger's syndrome: Secondary | ICD-10-CM

## 2024-09-29 DIAGNOSIS — F411 Generalized anxiety disorder: Secondary | ICD-10-CM

## 2024-09-29 DIAGNOSIS — Z8659 Personal history of other mental and behavioral disorders: Secondary | ICD-10-CM

## 2024-09-29 NOTE — Progress Notes (Signed)
 Psychiatry Telemedicine Outpatient Follow-up  TELEMEDICINE DOCUMENTATION:    Patient Location:  MyChart video visit from home address: [address]  [place] [address]    Patient/family aware of provider location:  yes  Patient/family consent for telemedicine:  yes  Examination observed and performed by:  Tinnie Crawford, PA-C     Date of service: 09/29/2024  Patient Name: Kristy Santiago  MRN: [id number]      CC: Medication follow-up for GAD, Aspberger syndrome, history of psychosis, tic disorder    Subjective: Pt is a 33 yo female who was last seen for med check appointment on 08/25/2024. At that time, her medication regimen was adjusted as follows:    -Continue Risperdal  0.5 mg BID. Patient wishes to have her endocrinologist, cardiologist and PCP monitor her lipid profile and blood glucose levels.   -Continue Pamelor  25 mg qhs.  -Continue Klonopin  1 mg TID.   -Continue hydroxyzine  100 mg up to TID prn anxiety/insomnia.   -Initiate clonidine 0.1 mg qhs. Patient closely monitors blood pressure.     The patient reports today that she has has significant abdominal pain and went to the ER. She is scheduled to see her PCP tomorrow, but now that is going to be rescheduled. She is sleeping better since being initiated on the clonidine. She has been watching her BP, denies hypotension. Her anxiety level has overall been stable. She has some days when she is more anxious than others, but her anxiety is overall manageable. She also notes that she is due for a colonoscopy. She wants to follow up with a gastroenterologist in [place]. She normally takes the hydroxyzine  twice per day. She has not noticed any tics. She denies AVH.     Social history: The patient does not work, homeschools her daughter. She lives with her mom and stepdad.     ROS:  Psych: As per HPI    Current medications: acetaminophen  (TYLENOL ) 325 mg Oral Tablet, Take 2 Tablets (650 mg total) by mouth Every 4 hours as needed  aspirin  (ECOTRIN) 81 mg Oral  Tablet, Delayed Release (E.C.), Take 1 Tablet (81 mg total) by mouth Daily  atorvastatin  (LIPITOR) 80 mg Oral Tablet, Take 1 Tablet (80 mg total) by mouth Every evening  Baclofen  (LIORESAL ) 20 mg Oral Tablet, Take 1 Tablet (20 mg total) by mouth Three times a day  carvediloL  (COREG ) 25 mg Oral Tablet, Take 1 Tablet (25 mg total) by mouth Twice daily with food  chlorhexidine  gluconate (PERIDEX ) 0.12 % Mucous Membrane Mouthwash, Swish and spit 15 mL Twice daily  clonazePAM  (KLONOPIN ) 1 mg Oral Tablet, Take 1 Tablet (1 mg total) by mouth Three times a day  clonazePAM  (KLONOPIN ) 1 mg Oral Tablet, Take 1 Tablet (1 mg total) by mouth Three times a day  cloNIDine HCL (CATAPRES) 0.1 mg Oral Tablet, Take 1 Tablet (0.1 mg total) by mouth Every night  clopidogreL  (PLAVIX ) 75 mg Oral Tablet, Take 1 Tablet (75 mg total) by mouth Daily  ezetimibe  (ZETIA ) 10 mg Oral Tablet, Take 1 Tablet (10 mg total) by mouth Every evening  fenofibrate  micronized (LOFIBRA) 200 mg Oral Capsule, Take 1 Capsule (200 mg total) by mouth Daily  hydrOXYzine  pamoate (VISTARIL ) 100 mg Oral Capsule, Take 1-2 tablets daily as needed for anxiety or insomnia.  isosorbide  mononitrate (IMDUR ) 30 mg Oral Tablet Sustained Release 24 hr, Take 1 Tablet (30 mg total) by mouth Every morning  losartan  (COZAAR ) 50 mg Oral Tablet, Take 1 Tablet (50 mg total) by mouth Twice  daily for 90 days  nitroGLYCERIN  (NITROSTAT ) 0.4 mg Sublingual Tablet, Sublingual, Place 1 Tablet (0.4 mg total) under the tongue Every 5 minutes as needed for Chest pain for 3 doses over 15 minutes  nortriptyline  (PAMELOR ) 25 mg Oral Capsule, Take 1 Capsule (25 mg total) by mouth Every night  risperiDONE  (RISPERDAL ) 0.5 mg Oral Tablet, Take 1 Tablet (0.5 mg total) by mouth Twice daily    No facility-administered medications prior to visit.      Allergies: Allergies[1]    Objective:  General appearance: WNWD female in NAD  HEENT: Normocephalic, atraumatic, PERR, EOM intact, anicteric, MMM, dentition  in good repair, no apparent thyromegaly  Skin: Warm, dry, intact, no rash or abrasion  Neuro: A&O x4 to person, place, date, and situation    Mental Status Examination:  Appearance: Appropriate grooming and hygiene, in seasonally-appropriate casual attire, appears about stated age, normal gait.  Behavior: Calm, pleasant, and cooperative. Appropriate eye-contact.  Speech: Easily able to interpret. Regular in rate, rhythm, tone, and volume.  Mood: Okay  Affect: Mood congruent, euthymic, full-range  Thought Process: Logical, linear, and goal-directed. No blocking, flight of ideas, or loose associations.  Thought Content: Without suicidal or homicidal ideations  Perceptions: Denies A/V hallucinations. No evidence of paranoid thinking or delusional thought processes. Not responding to internal stimuli.  Memory: Immediate recall, short term, and remote past memory grossly intact.  Language: appropriate recognition and naming. No perseveration or delay in response.  Fund of knowledge: Intact. Intelligence estimated to be about average.  Attention span and concentration: Sustained/Intact  Insight: Fair  Judgment: Fair    Labs:    Assessment:  This patient is a 33 yo female seen for follow-up medication appointment. Pt reports severe nausea. Medications were discussed in detail including risks, benefits, and alternatives. The diagnosis is as follows:    ICD-10-CM    1. GAD (generalized anxiety disorder)  F41.1       2. Asperger syndrome  F84.5       3. Tic disorder  F95.9       4. History of psychosis  Z86.59             Plan:  1. Return to clinic in 4-8 weeks.   -May return sooner if needed or contact via phone or [facility].    2. Pharmacological intervention as follows:    -Continue Risperdal  0.5 mg BID. Patient wishes to have her endocrinologist, cardiologist and PCP monitor her lipid profile and blood glucose levels.   -Continue Pamelor  25 mg qhs.  -Continue Klonopin  1 mg TID.   -Continue hydroxyzine  100 mg up to TID  prn anxiety/insomnia.   -Continue clonidine 0.1 mg qhs.    3. Labs:     4. Psychotherapy discussed     5. Supportive therapy provided and non-pharmacological treatment modalities reviewed      All current medications, including OTC meds, have been reviewed, discussed, and adjusted as clinically appropriate with the patient. The patient expressed an understanding of and agreement with the plan outlined above.      Patient advised to call, return to the clinic, or contact provider via [facility] to report side effects or worsening of symptoms. Also reviewed emergency contacts including after-hours [facility] line, [facility] line, ED, and 911 for emergencies.  Patient was informed of the 24 hour per day, 7 days a week psychiatric coverage in the emergency department.    Tinnie Crawford, PA-C          [1]  Allergies  Allergen Reactions    Invega [Paliperidone] Hives/ Urticaria     Patient also noted irregular heart rate, bloodshot eyes, and feeling unwell.    Ondansetron   Other Adverse Reaction (Add comment)     Prolonged QTC    Victoza [Liraglutide] Nausea/ Vomiting

## 2024-11-05 ENCOUNTER — Telehealth: Payer: Self-pay | Admitting: PHYSICIAN ASSISTANT

## 2024-11-05 DIAGNOSIS — F959 Tic disorder, unspecified: Secondary | ICD-10-CM

## 2024-11-05 DIAGNOSIS — F845 Asperger's syndrome: Secondary | ICD-10-CM

## 2024-11-05 DIAGNOSIS — F411 Generalized anxiety disorder: Secondary | ICD-10-CM

## 2024-11-05 DIAGNOSIS — Z8659 Personal history of other mental and behavioral disorders: Secondary | ICD-10-CM

## 2024-11-05 MED ORDER — CLONAZEPAM 1 MG TABLET: 1.0000 mg | ORAL_TABLET | Freq: Three times a day (TID) | ORAL | 5 refills | Status: AC

## 2024-11-05 NOTE — Progress Notes (Signed)
 Psychiatry Telemedicine Outpatient Follow-up  TELEMEDICINE DOCUMENTATION:    Patient Location:  MyChart video visit from home address: [address]  [place] [address]    Patient/family aware of provider location:  yes  Patient/family consent for telemedicine:  yes  Examination observed and performed by:  Tinnie Crawford, PA-C     Date of service: 11/05/2024  Patient Name: Kristy Santiago  MRN: [id number]      CC: Medication follow-up for GAD, Asperger syndrome, tic disorder, history of psychosis    Subjective: Pt is a 33 yo female who was last seen for med check appointment on 09/29/2024. At that time, her medication regimen was adjusted as follows:      -Continue Risperdal  0.5 mg BID. Patient wishes to have her endocrinologist, cardiologist and PCP monitor her lipid profile and blood glucose levels.   -Continue Pamelor  25 mg qhs.  -Continue Klonopin  1 mg TID.   -Continue hydroxyzine  100 mg up to TID prn anxiety/insomnia.   -Continue clonidine  0.1 mg qhs.    The patient reports today that her mood has been stable. She denies rapid mood changes. Her anxiety level is a little bit better. Some days she sleeps well, but other days she wakes up at 4 AM. She has been struggling with episodes of nausea. Her PCP ordered AXR, which demonstrated constipation. She normally is able to keep down her evening meds, but occasionally has difficulty keeping down her AM meds. She has not noticed any tics since her last appointment. She denies AVH. She takes the hydroxyzine  nightly for sleep.     Social history: The patient does not work, homeschools her daughter. She lives with her mom and stepdad.     ROS:  Psych: As per HPI    Current medications: acetaminophen  (TYLENOL ) 325 mg Oral Tablet, Take 2 Tablets (650 mg total) by mouth Every 4 hours as needed  aspirin  (ECOTRIN) 81 mg Oral Tablet, Delayed Release (E.C.), Take 1 Tablet (81 mg total) by mouth Daily  atorvastatin  (LIPITOR) 80 mg Oral Tablet, Take 1 Tablet (80 mg total) by  mouth Every evening  Baclofen  (LIORESAL ) 20 mg Oral Tablet, Take 1 Tablet (20 mg total) by mouth Three times a day  carvediloL  (COREG ) 25 mg Oral Tablet, Take 1 Tablet (25 mg total) by mouth Twice daily with food  chlorhexidine  gluconate (PERIDEX ) 0.12 % Mucous Membrane Mouthwash, Swish and spit 15 mL Twice daily  clonazePAM  (KLONOPIN ) 1 mg Oral Tablet, Take 1 Tablet (1 mg total) by mouth Three times a day  clonazePAM  (KLONOPIN ) 1 mg Oral Tablet, Take 1 Tablet (1 mg total) by mouth Three times a day  cloNIDine  HCL (CATAPRES ) 0.1 mg Oral Tablet, Take 1 Tablet (0.1 mg total) by mouth Every night  clopidogreL  (PLAVIX ) 75 mg Oral Tablet, Take 1 Tablet (75 mg total) by mouth Daily  ezetimibe  (ZETIA ) 10 mg Oral Tablet, Take 1 Tablet (10 mg total) by mouth Every evening  fenofibrate  micronized (LOFIBRA) 200 mg Oral Capsule, Take 1 Capsule (200 mg total) by mouth Daily  hydrOXYzine  pamoate (VISTARIL ) 100 mg Oral Capsule, Take 1-2 tablets daily as needed for anxiety or insomnia.  isosorbide  mononitrate (IMDUR ) 30 mg Oral Tablet Sustained Release 24 hr, Take 1 Tablet (30 mg total) by mouth Every morning  losartan  (COZAAR ) 50 mg Oral Tablet, Take 1 Tablet (50 mg total) by mouth Twice daily for 90 days  nitroGLYCERIN  (NITROSTAT ) 0.4 mg Sublingual Tablet, Sublingual, Place 1 Tablet (0.4 mg total) under the tongue Every 5 minutes  as needed for Chest pain for 3 doses over 15 minutes  nortriptyline  (PAMELOR ) 25 mg Oral Capsule, Take 1 Capsule (25 mg total) by mouth Every night  risperiDONE  (RISPERDAL ) 0.5 mg Oral Tablet, Take 1 Tablet (0.5 mg total) by mouth Twice daily    No facility-administered medications prior to visit.      Allergies: Allergies[1]    Objective:  General appearance: WNWD female in NAD  HEENT: Normocephalic, atraumatic, PERR, EOM intact, anicteric, MMM, dentition in good repair, no apparent thyromegaly  Skin: Warm, dry, intact, no rash or abrasion  Neuro: A&O x4 to person, place, date, and situation    Mental  Status Examination:  Appearance: Appropriate grooming and hygiene, in seasonally-appropriate casual attire, appears about stated age, normal gait.  Behavior: Calm, pleasant, and cooperative. Appropriate eye-contact.  Speech: Easily able to interpret. Regular in rate, rhythm, tone, and volume.  Mood: Okay  Affect: Mood congruent, euthymic, full-range  Thought Process: Logical, linear, and goal-directed. No blocking, flight of ideas, or loose associations.  Thought Content: Without suicidal or homicidal ideations  Perceptions: Denies A/V hallucinations. No evidence of paranoid thinking or delusional thought processes. Not responding to internal stimuli.  Memory: Immediate recall, short term, and remote past memory grossly intact.  Language: appropriate recognition and naming. No perseveration or delay in response.  Fund of knowledge: Intact. Intelligence estimated to be about average.  Attention span and concentration: Sustained/Intact  Insight: Fair  Judgment: Fair    Labs:    Assessment:  This patient is a 33 yo female seen for follow-up medication appointment. Pt reports wanting to continue current medication regimen. Medications were discussed in detail including risks, benefits, and alternatives. The diagnosis is as follows:    ICD-10-CM    1. GAD (generalized anxiety disorder)  F41.1       2. Asperger syndrome  F84.5       3. Tic disorder  F95.9       4. History of psychosis  Z86.59             Plan:  1. Return to clinic in 8-12 weeks.   -May return sooner if needed or contact via phone or [facility].    2. Pharmacological intervention as follows:  -Continue Risperdal  0.5 mg BID. Patient wishes to have her endocrinologist, cardiologist and PCP monitor her lipid profile and blood glucose levels.   -Continue Pamelor  25 mg qhs.  -Continue Klonopin  1 mg TID.   -Continue hydroxyzine  100 mg up to TID prn anxiety/insomnia.   -Continue clonidine  0.1 mg qhs.    3. Labs:     4. Psychotherapy discussed     5. Supportive  therapy provided and non-pharmacological treatment modalities reviewed      All current medications, including OTC meds, have been reviewed, discussed, and adjusted as clinically appropriate with the patient. The patient expressed an understanding of and agreement with the plan outlined above.      Patient advised to call, return to the clinic, or contact provider via [facility] to report side effects or worsening of symptoms. Also reviewed emergency contacts including after-hours [facility] line, [facility] line, ED, and 911 for emergencies.  Patient was informed of the 24 hour per day, 7 days a week psychiatric coverage in the emergency department.    Tinnie Crawford, PA-C          [1]   Allergies  Allergen Reactions    Invega [Paliperidone] Hives/ Urticaria     Patient also noted irregular heart rate,  bloodshot eyes, and feeling unwell.    Ondansetron   Other Adverse Reaction (Add comment)     Prolonged QTC    Victoza [Liraglutide] Nausea/ Vomiting

## 2024-11-20 ENCOUNTER — Other Ambulatory Visit (INDEPENDENT_AMBULATORY_CARE_PROVIDER_SITE_OTHER): Payer: Self-pay | Admitting: PHYSICIAN ASSISTANT

## 2025-02-04 ENCOUNTER — Encounter (INDEPENDENT_AMBULATORY_CARE_PROVIDER_SITE_OTHER): Payer: Self-pay | Admitting: PHYSICIAN ASSISTANT
